# Patient Record
Sex: Female | Born: 1960 | Race: Black or African American | Hispanic: No | Marital: Married | Smoking: Former smoker
Health system: Southern US, Community
[De-identification: ages and names within clinical notes are randomized; demographics above are authoritative.]

## PROBLEM LIST (undated history)

## (undated) DIAGNOSIS — N200 Calculus of kidney: Secondary | ICD-10-CM

## (undated) DIAGNOSIS — Z5189 Encounter for other specified aftercare: Secondary | ICD-10-CM

## (undated) DIAGNOSIS — S88911A Complete traumatic amputation of right lower leg, level unspecified, initial encounter: Secondary | ICD-10-CM

## (undated) DIAGNOSIS — I10 Essential (primary) hypertension: Secondary | ICD-10-CM

## (undated) DIAGNOSIS — G839 Paralytic syndrome, unspecified: Secondary | ICD-10-CM

## (undated) DIAGNOSIS — IMO0001 Reserved for inherently not codable concepts without codable children: Secondary | ICD-10-CM

## (undated) DIAGNOSIS — S88912A Complete traumatic amputation of left lower leg, level unspecified, initial encounter: Secondary | ICD-10-CM

## (undated) HISTORY — PX: OTHER SURGICAL HISTORY: SHX169

## (undated) HISTORY — PX: CHOLECYSTECTOMY: SHX55

## (undated) HISTORY — PX: LEG AMPUTATION: SHX1105

---

## 2000-01-19 ENCOUNTER — Emergency Department (HOSPITAL_COMMUNITY): Admission: EM | Admit: 2000-01-19 | Discharge: 2000-01-19 | Payer: Self-pay | Admitting: Emergency Medicine

## 2000-07-20 ENCOUNTER — Inpatient Hospital Stay (HOSPITAL_COMMUNITY): Admission: AD | Admit: 2000-07-20 | Discharge: 2000-07-26 | Payer: Self-pay | Admitting: Family Medicine

## 2000-07-20 ENCOUNTER — Encounter (INDEPENDENT_AMBULATORY_CARE_PROVIDER_SITE_OTHER): Payer: Self-pay | Admitting: *Deleted

## 2000-07-21 ENCOUNTER — Encounter: Payer: Self-pay | Admitting: Family Medicine

## 2000-08-13 ENCOUNTER — Encounter: Admission: RE | Admit: 2000-08-13 | Discharge: 2000-08-13 | Payer: Self-pay | Admitting: Family Medicine

## 2001-03-22 ENCOUNTER — Emergency Department (HOSPITAL_COMMUNITY): Admission: EM | Admit: 2001-03-22 | Discharge: 2001-03-22 | Payer: Self-pay | Admitting: Emergency Medicine

## 2001-03-24 ENCOUNTER — Inpatient Hospital Stay (HOSPITAL_COMMUNITY): Admission: EM | Admit: 2001-03-24 | Discharge: 2001-05-10 | Payer: Self-pay | Admitting: *Deleted

## 2001-03-24 ENCOUNTER — Encounter (INDEPENDENT_AMBULATORY_CARE_PROVIDER_SITE_OTHER): Payer: Self-pay | Admitting: Specialist

## 2001-03-24 ENCOUNTER — Encounter (INDEPENDENT_AMBULATORY_CARE_PROVIDER_SITE_OTHER): Payer: Self-pay | Admitting: *Deleted

## 2001-03-25 ENCOUNTER — Encounter: Payer: Self-pay | Admitting: Family Medicine

## 2001-03-31 ENCOUNTER — Encounter: Payer: Self-pay | Admitting: Family Medicine

## 2001-04-02 ENCOUNTER — Encounter: Payer: Self-pay | Admitting: Family Medicine

## 2001-04-22 ENCOUNTER — Encounter: Payer: Self-pay | Admitting: Family Medicine

## 2001-05-10 ENCOUNTER — Ambulatory Visit (HOSPITAL_COMMUNITY)
Admission: RE | Admit: 2001-05-10 | Discharge: 2001-05-10 | Payer: Self-pay | Admitting: Physical Medicine & Rehabilitation

## 2001-05-10 ENCOUNTER — Inpatient Hospital Stay
Admission: RE | Admit: 2001-05-10 | Discharge: 2001-06-08 | Payer: Self-pay | Admitting: Physical Medicine & Rehabilitation

## 2002-02-11 ENCOUNTER — Ambulatory Visit (HOSPITAL_COMMUNITY): Admission: RE | Admit: 2002-02-11 | Discharge: 2002-02-11 | Payer: Self-pay | Admitting: Plastic Surgery

## 2002-04-18 ENCOUNTER — Ambulatory Visit (HOSPITAL_COMMUNITY): Admission: RE | Admit: 2002-04-18 | Discharge: 2002-04-18 | Payer: Self-pay | Admitting: Plastic Surgery

## 2002-04-22 ENCOUNTER — Encounter: Payer: Self-pay | Admitting: Orthopedic Surgery

## 2002-04-22 ENCOUNTER — Encounter (INDEPENDENT_AMBULATORY_CARE_PROVIDER_SITE_OTHER): Payer: Self-pay | Admitting: Specialist

## 2002-04-22 ENCOUNTER — Inpatient Hospital Stay (HOSPITAL_COMMUNITY): Admission: RE | Admit: 2002-04-22 | Discharge: 2002-04-26 | Payer: Self-pay | Admitting: Orthopedic Surgery

## 2002-04-26 ENCOUNTER — Encounter: Admission: RE | Admit: 2002-04-26 | Discharge: 2002-04-26 | Payer: Self-pay | Admitting: Family Medicine

## 2003-02-15 ENCOUNTER — Inpatient Hospital Stay (HOSPITAL_COMMUNITY): Admission: EM | Admit: 2003-02-15 | Discharge: 2003-02-21 | Payer: Self-pay | Admitting: Emergency Medicine

## 2003-02-15 ENCOUNTER — Encounter: Payer: Self-pay | Admitting: Emergency Medicine

## 2003-02-18 ENCOUNTER — Encounter: Payer: Self-pay | Admitting: Family Medicine

## 2003-07-06 ENCOUNTER — Emergency Department (HOSPITAL_COMMUNITY): Admission: EM | Admit: 2003-07-06 | Discharge: 2003-07-07 | Payer: Self-pay | Admitting: Emergency Medicine

## 2003-08-09 ENCOUNTER — Inpatient Hospital Stay (HOSPITAL_COMMUNITY): Admission: EM | Admit: 2003-08-09 | Discharge: 2003-09-01 | Payer: Self-pay | Admitting: Emergency Medicine

## 2003-08-12 ENCOUNTER — Encounter (INDEPENDENT_AMBULATORY_CARE_PROVIDER_SITE_OTHER): Payer: Self-pay | Admitting: Specialist

## 2003-08-16 ENCOUNTER — Encounter (INDEPENDENT_AMBULATORY_CARE_PROVIDER_SITE_OTHER): Payer: Self-pay | Admitting: Specialist

## 2003-08-29 ENCOUNTER — Encounter (INDEPENDENT_AMBULATORY_CARE_PROVIDER_SITE_OTHER): Payer: Self-pay | Admitting: Specialist

## 2004-03-21 ENCOUNTER — Ambulatory Visit (HOSPITAL_COMMUNITY): Admission: RE | Admit: 2004-03-21 | Discharge: 2004-03-21 | Payer: Self-pay | Admitting: Urology

## 2005-11-06 ENCOUNTER — Ambulatory Visit: Payer: Self-pay | Admitting: Internal Medicine

## 2005-11-06 ENCOUNTER — Inpatient Hospital Stay (HOSPITAL_COMMUNITY): Admission: EM | Admit: 2005-11-06 | Discharge: 2005-11-19 | Payer: Self-pay | Admitting: Emergency Medicine

## 2005-11-13 ENCOUNTER — Encounter: Payer: Self-pay | Admitting: Urology

## 2005-11-17 ENCOUNTER — Ambulatory Visit: Payer: Self-pay | Admitting: Physical Medicine & Rehabilitation

## 2005-11-19 ENCOUNTER — Inpatient Hospital Stay (HOSPITAL_COMMUNITY)
Admission: RE | Admit: 2005-11-19 | Discharge: 2005-11-26 | Payer: Self-pay | Admitting: Physical Medicine & Rehabilitation

## 2005-11-26 ENCOUNTER — Inpatient Hospital Stay (HOSPITAL_COMMUNITY): Admission: AD | Admit: 2005-11-26 | Discharge: 2005-12-03 | Payer: Self-pay | Admitting: Internal Medicine

## 2005-11-26 ENCOUNTER — Ambulatory Visit: Payer: Self-pay | Admitting: Internal Medicine

## 2005-12-04 ENCOUNTER — Ambulatory Visit (HOSPITAL_COMMUNITY): Admission: RE | Admit: 2005-12-04 | Discharge: 2005-12-04 | Payer: Self-pay | Admitting: Urology

## 2006-01-05 ENCOUNTER — Ambulatory Visit (HOSPITAL_COMMUNITY): Admission: RE | Admit: 2006-01-05 | Discharge: 2006-01-05 | Payer: Self-pay | Admitting: Urology

## 2006-08-20 DIAGNOSIS — E669 Obesity, unspecified: Secondary | ICD-10-CM

## 2006-08-20 DIAGNOSIS — L8990 Pressure ulcer of unspecified site, unspecified stage: Secondary | ICD-10-CM

## 2009-11-27 ENCOUNTER — Inpatient Hospital Stay (HOSPITAL_COMMUNITY): Admission: EM | Admit: 2009-11-27 | Discharge: 2009-12-13 | Payer: Self-pay | Admitting: Emergency Medicine

## 2009-12-06 ENCOUNTER — Ambulatory Visit: Payer: Self-pay | Admitting: Internal Medicine

## 2009-12-12 ENCOUNTER — Encounter: Payer: Self-pay | Admitting: Urology

## 2010-07-14 ENCOUNTER — Encounter: Payer: Self-pay | Admitting: Urology

## 2010-09-08 LAB — BASIC METABOLIC PANEL
CO2: 30 mEq/L (ref 19–32)
Chloride: 104 mEq/L (ref 96–112)
Chloride: 107 mEq/L (ref 96–112)
Creatinine, Ser: 0.45 mg/dL (ref 0.4–1.2)
GFR calc Af Amer: >60 mL/min (ref >60)
GFR calc Af Amer: >60 mL/min (ref >60)
GFR calc non Af Amer: >60 mL/min (ref >60)
GFR calc non Af Amer: >60 mL/min (ref >60)
GFR calc non Af Amer: >60 mL/min (ref >60)
Glucose, Bld: 100 mg/dL — ABNORMAL HIGH (ref 70–99)
Glucose, Bld: 84 mg/dL (ref 70–99)
Potassium: 2.8 mEq/L — ABNORMAL LOW (ref 3.5–5.1)
Sodium: 139 mEq/L (ref 135–145)

## 2010-09-08 LAB — CBC
HCT: 25.3 % — ABNORMAL LOW (ref 36.0–46.0)
HCT: 28.9 % — ABNORMAL LOW (ref 36.0–46.0)
Hemoglobin: 8.1 g/dL — ABNORMAL LOW (ref 12.0–15.0)
Hemoglobin: 9.3 g/dL — ABNORMAL LOW (ref 12.0–15.0)
MCHC: 32 g/dL (ref 30.0–36.0)
MCV: 80.9 fL (ref 78.0–100.0)
Platelets: 450 10*3/uL — ABNORMAL HIGH (ref 150–400)
RBC: 3.13 MIL/uL — ABNORMAL LOW (ref 3.87–5.11)
RBC: 3.57 MIL/uL — ABNORMAL LOW (ref 3.87–5.11)
RDW: 17.2 % — ABNORMAL HIGH (ref 11.5–15.5)
RDW: 18.8 % — ABNORMAL HIGH (ref 11.5–15.5)
WBC: 6.1 10*3/uL (ref 4.0–10.5)
WBC: 6.7 10*3/uL (ref 4.0–10.5)

## 2010-09-08 LAB — COMPREHENSIVE METABOLIC PANEL
ALT: 96 U/L — ABNORMAL HIGH (ref 0–35)
AST: 110 U/L — ABNORMAL HIGH (ref 0–37)
Albumin: 1.2 g/dL — ABNORMAL LOW (ref 3.5–5.2)
Calcium: 5.9 mg/dL — CL (ref 8.4–10.5)
Creatinine, Ser: 0.31 mg/dL — ABNORMAL LOW (ref 0.4–1.2)
GFR calc non Af Amer: >60 mL/min (ref >60)

## 2010-09-08 LAB — PROTIME-INR
INR: 1.35 (ref 0.00–1.49)
Prothrombin Time: 16.6 seconds — ABNORMAL HIGH (ref 11.6–15.2)

## 2010-09-08 LAB — HEPATIC FUNCTION PANEL
ALT: 145 U/L — ABNORMAL HIGH (ref 0–35)
AST: 189 U/L — ABNORMAL HIGH (ref 0–37)
Albumin: 1.7 g/dL — ABNORMAL LOW (ref 3.5–5.2)
Alkaline Phosphatase: 80 U/L (ref 39–117)
Bilirubin, Direct: 0.1 mg/dL (ref 0.0–0.3)
Bilirubin, Direct: <0.1 mg/dL (ref 0.0–0.3)
Indirect Bilirubin: 0.8 mg/dL (ref 0.3–0.9)
Total Protein: 5.8 g/dL — ABNORMAL LOW (ref 6.0–8.3)

## 2010-09-08 LAB — LIPASE, BLOOD: Lipase: 68 U/L — ABNORMAL HIGH (ref 11–59)

## 2010-09-08 LAB — AMYLASE: Amylase: 55 U/L (ref 0–105)

## 2010-09-09 LAB — CBC
HCT: 30.3 % — ABNORMAL LOW (ref 36.0–46.0)
MCHC: 32 g/dL (ref 30.0–36.0)
MCV: 81.7 fL (ref 78.0–100.0)
Platelets: 333 10*3/uL (ref 150–400)
Platelets: 357 10*3/uL (ref 150–400)
Platelets: 367 10*3/uL (ref 150–400)
RDW: 17.5 % — ABNORMAL HIGH (ref 11.5–15.5)
RDW: 17.7 % — ABNORMAL HIGH (ref 11.5–15.5)
RDW: 17.9 % — ABNORMAL HIGH (ref 11.5–15.5)
WBC: 5.7 10*3/uL (ref 4.0–10.5)

## 2010-09-09 LAB — BASIC METABOLIC PANEL
BUN: 10 mg/dL (ref 6–23)
BUN: 6 mg/dL (ref 6–23)
BUN: 7 mg/dL (ref 6–23)
CO2: 23 mEq/L (ref 19–32)
Calcium: 8 mg/dL — ABNORMAL LOW (ref 8.4–10.5)
Calcium: 8.9 mg/dL (ref 8.4–10.5)
Chloride: 110 mEq/L (ref 96–112)
Creatinine, Ser: 0.53 mg/dL (ref 0.4–1.2)
Creatinine, Ser: 0.8 mg/dL (ref 0.4–1.2)
GFR calc Af Amer: >60 mL/min (ref >60)
GFR calc non Af Amer: >60 mL/min (ref >60)
GFR calc non Af Amer: >60 mL/min (ref >60)
Glucose, Bld: 81 mg/dL (ref 70–99)
Potassium: 3.4 mEq/L — ABNORMAL LOW (ref 3.5–5.1)

## 2010-09-09 LAB — URINE CULTURE
Colony Count: 55000
Special Requests: NEGATIVE

## 2010-09-09 LAB — URINALYSIS, ROUTINE W REFLEX MICROSCOPIC
Ketones, ur: 15 mg/dL — AB
Nitrite: NEGATIVE
Specific Gravity, Urine: 1.014 (ref 1.005–1.030)
pH: 6 (ref 5.0–8.0)

## 2010-09-09 LAB — DIFFERENTIAL
Basophils Absolute: 0.1 10*3/uL (ref 0.0–0.1)
Eosinophils Relative: 3 % (ref 0–5)
Lymphocytes Relative: 18 % (ref 12–46)
Neutro Abs: 5.1 10*3/uL (ref 1.7–7.7)
Neutrophils Relative %: 71 % (ref 43–77)

## 2010-09-09 LAB — URINE MICROSCOPIC-ADD ON
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
## 2010-11-08 NOTE — Op Note (Signed)
[name], [name]                  ACCOUNT NO.:  [id number]   MEDICAL RECORD NO.:  [id number]          PATIENT TYPE:  AMB   LOCATION:  DAY                          FACILITY:  [facility]   PHYSICIAN:  Courtney Paris, M.D.DATE OF BIRTH:  Feb 07, 1961   DATE OF PROCEDURE:  11/12/2005  DATE OF DISCHARGE:                                 OPERATIVE REPORT   PREOPERATIVE DIAGNOSIS:  Proximal left ureteral stone with hydronephrosis  and sepsis, bladder stones.   POSTOPERATIVE DIAGNOSIS:  Proximal left ureteral stone with hydronephrosis  and sepsis, bladder stones.   OPERATION:  Vesicolithotomy, left ureteroscopy with Holmium lithotripsy, and  attempted insertion of left ureteral stent.   ANESTHESIA:  MAC.   SURGEON:  Courtney Paris, M.D.   ASSISTANT:  None.   BRIEF HISTORY:  This 50 year old paraplegic bilateral amputee was admitted  to the hospital last week with GU sepsis and was found to have a  hydronephrosis and had first a PICC line inserted and then a left  percutaneous nephrostomy.  She has been on antibiotics since then and enters  now for attempt at ureteroscopy of a large stone 2 by 1 cm in the proximal  left ureter.  She has a regrowth of her right staghorn which was treated by  percutaneous nephrolithotomy two years ago at [facility].  I saw her  two years ago also and she had no stones on the left except one that was in  the left distal ureter that passed after a was placed at that time and she  had some large bladder stones that were lasered at that time, as well.  The  stones have regrown on both sides and the obstructing stone on the left as  the main culprit to be addressed at this time.   DESCRIPTION OF PROCEDURE:  The patient was placed on the operating table in  a supine position. After satisfactory induction of MAC anesthesia because of  her paraplegia, the bladder was entered with a cystoscope.  There were some  large stones within the bladder which  were picked out using grasping  forceps.  The largest was about 3 cm in size.  The smaller ones were also  picked out until the bladder was clean.  The left orifice was then  catheterized with a ureteral guidewire and under fluoroscopy, I passed this  up to the level of the stone.  I could not get this to go beyond the stone  and it seemed to be impacted at this point.  Leaving the guidewire in place,  I was able to pass the short ureteroscope up the ureter which was fairly  well dilated up to the level of the stone.  The Holmium laser with the 375  micron fiber was then calibrated and at 0.5 watts at 5 shocks per second,  the stone was began to be treated under direct vision. After about 45  minutes, I was able to remove about half of the stone by fluoroscopy.  There  was still a large piece seen but I could not, even under direct  vision, get  a guidewire past this and fear of injuring the ureter, I decided to abandon  the procedure at this point.  A lot of the pieces that I broke up should  pass out through the ureter.  No large pieces were seen as the scope was  removed.  The bladder was then drained and a #18 Foley catheter inserted,  and the patient taken to the recovery room in good condition.   PLAN:  We need to do lithotripsy on the remaining fragment which is now  considerably smaller and maybe even some of the stones in the left kidney  with a nephrostomy in place or attempt at a percutaneous procedure which  would be a little bit more invasive at a later time.  It depends whether  this can be scheduled while she is in the hospital and while she still has a  PICC line.      Courtney Paris, M.D.  Electronically Signed     HMK/MEDQ  D:  11/12/2005  T:  11/12/2005  Job:  [id number]
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
## 2010-11-08 NOTE — Discharge Summary (Signed)
[name], [name]                  ACCOUNT NO.:  [id number]   MEDICAL RECORD NO.:  [id number]          PATIENT TYPE:  INP   LOCATION:  2032                         FACILITY:  [facility]   PHYSICIAN:  C. Ulyess Mort, M.D.DATE OF BIRTH:  1961/05/19   DATE OF ADMISSION:  11/26/2005  DATE OF DISCHARGE:                                 DISCHARGE SUMMARY   ADDENDUM:   MEDICATION LIST:  Aygestin 5 mg p.o. nightly      Clent Demark, M.D.    ______________________________  C. Ulyess Mort, M.D.    Verlin Grills  D:  12/03/2005  T:  12/03/2005  Job:  [id number]
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
## 2010-11-08 NOTE — Discharge Summary (Signed)
NAMEMASAYO, FERA                  ACCOUNT NO.:  [id number]   MEDICAL RECORD NO.:  [id number]          PATIENT TYPE:  INP   LOCATION:  2032                         FACILITY:  [facility]   PHYSICIAN:  C. Ulyess Mort, M.D.DATE OF BIRTH:  1961-04-27   DATE OF ADMISSION:  11/26/2005  DATE OF DISCHARGE:  12/03/2005                                 DISCHARGE SUMMARY   ADDENDUM:   DISCHARGE MEDICATIONS:  1.  Coumadin 5 mg p.o. daily.  2.  Trazodone 25 mg p.o. q.h.s.  3.  Protonix 40 mg p.o. daily.  4.  Ibuprofen 600 mg q.6 h p.r.n. pain.  5.  Morphine 1-2 mg IV p.r.n. severe pain q.4 h.   Please see the discharge summary dated December 01, 2005 for complete discharge  information. On the day of anticipated discharge, Ms. Pierron had some vaginal  bleeding as well as hematuria noted in her catheter bag. For this reason,  she was held an additional day to make sure that blood counts were stable.  For the menstrual bleeding, she was restarted on Aygestin which was  originally held when she was diagnosed with pulmonary embolus. It was felt  by the gynecologist, the pharmacist, and in discussion with the hematologist  that this would be a safe medication to continue in the setting of  anticoagulation for acute pulmonary embolism. The patient will need close  followup with gynecology for more definitive management for menorrhagia. She  has been instructed to followup with Dr. Jackelyn Knife who is scheduled to  return from vacation on June 18. By the day of discharge, the patient's  Lovenox has been discontinued as she has had a therapeutic INR for the past  two days and has had five full days of antithrombin overlap between Lovenox  and Coumadin. On today's labs, the patient's INR was 3.1, however given the  patient's age, weight and previous doses of Coumadin, it is felt that she  will likely need 5 mg daily dose to maintain a therapeutic INR. She will  need and INR tracked on Friday, December 05, 2005 and  again the following  Tuesday, December 10, 2005 to ensure that her INR is maintained between 2 and 3  which is the goal for pulmonary embolism. The Coumadin dose can be adjusted  accordingly to maintain therapeutic INR. Additionally, the patient was noted  to have some hematuria on December 02, 2005 which did decrease over the course  of the day. CBC showed a hemoglobin of 12.1 which is consistent with  previous value. Ms. Bragdon has an appointment to followup with Dr. Aldean Ast in  urology December 04, 2005, the day following discharge at 1:15 in the afternoon  for further management of the hematuria and positive urine cultures from  November 30, 2005 ordered by Dr. Aldean Ast which are growing Proteus Mirabilis  which is resistant to Cipro and Macrobid. We will defer the decision of  whether or not to treat this as an infection versus colonization to Dr.  Aldean Ast in the outpatient setting.   DISCHARGE LABS AND VITALS:  Temperature 98.8, blood pressure  132/80, pulse  76, respirations 20. Oxygen saturation 96% on room air. White count 6.1,  hemoglobin 11.3, platelets 330. INR 3.1.      Clent Demark, M.D.    ______________________________  C. Ulyess Mort, M.D.    Verlin Grills  D:  12/03/2005  T:  12/03/2005  Job:  [id number]   cc:   Courtney Paris, M.D.  Fax: [phone]   Zenaida Niece, M.D.  Fax: [phone]   Lianne Bushy, M.D.  Fax: [phone]
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
## 2010-11-08 NOTE — Op Note (Signed)
. [facility]  Patient:    [name], [name] Visit Number: [id number] MRN: [id number]          Service Type: MED Location: [phone] Attending Physician:  Sanjuana Letters Dictated by:   Nadara Mustard, M.D. Proc. Date: 04/02/01 Admit Date:  03/24/2001                             Operative Report  PREOPERATIVE DIAGNOSES: 1. Osteomyelitis, right hindfoot. 2. [address] grade 3 ulceration involving the entire plantar aspect of the foot.  PROCEDURE:  Right below-knee amputation.  SURGEON:  Nadara Mustard, M.D.  ANESTHESIA:  MAC.  ESTIMATED BLOOD LOSS:  Minimal.  TOURNIQUET TIME:  15 minutes at 350 mmHg.  DRESSING:  Quincy Simmonds compressive dressing.  ANTIBIOTICS:  Unasyn preoperatively.  DISPOSITION:  To PACU in stable condition.  INDICATION FOR PROCEDURE:  The patient is a 50 year old woman paraplegic with large ulcerations on both feet.  She has had an ulceration on her right foot which probes the bone, involves the entire plantar aspect of her foot.  Bone scan is positive for osteomyelitis.  After discussion with the patient, continued conservative care with wound dressing with persistent ulceration. Patient wishes to proceed with below-knee amputation at this time.  The risks and benefits were discussed, including infection, nonhealing of the wound, need for a higher-level amputation.  Patient states she understands and wishes to proceed at this time.  DESCRIPTION OF PROCEDURE:  The patient was brought to OR room 15 and underwent a MAC anesthetic.  After adequate level of anesthesia obtained, patients right lower extremity was prepped using Duraprep, draped into a sterile field, and her foot was draped sterilely and then impervious stockinette.  A transverse incision was made 10 cm distal to the tibial tubercle, and this was curved proximally and a large posterior flap was developed.  The tibia was cut 1 cm proximal to the skin  incision and the fibula 1 cm proximal to the tibia. The neurovascular bundles were clamped.  The amputation was performed and all neurovascular bundles were suture ligated x 3 with 2-0 silk.  The tourniquet was deflated after 15 minutes.  Hemostasis was obtained.  The deep posterior flap was closed using #1 PDS, the deep fascia was closed using 0 Vicryl, skin was closed using Approximate staples.  The wound was covered with Adaptic orthopedic sponges, and a compressive Quincy Simmonds dressing was applied.  The patient was taken to the PACU in stable condition.  Plan to follow up with her medical management and continue wound care for her left foot. Dictated by:   Nadara Mustard, M.D. Attending Physician:  Sanjuana Letters DD:  04/02/01 TD:  04/02/01 Job: [id number]
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
## 2010-11-08 NOTE — Op Note (Signed)
   NAMEMEGIN, CONSALVO                            ACCOUNT NO.:  [id number]   MEDICAL RECORD NO.:  [id number]                   PATIENT TYPE:  OUT   LOCATION:  MDC                                  FACILITY:  [facility]   PHYSICIAN:  Etter Sjogren, MD                    DATE OF BIRTH:  July 06, 1960   DATE OF PROCEDURE:  04/18/2002  DATE OF DISCHARGE:                                 OPERATIVE REPORT   PREOPERATIVE DIAGNOSIS:  Sacral pressure sore.   POSTOPERATIVE DIAGNOSIS:  Sacral pressure sore.   OPERATION:  Debridement of sacral pressure sore.   SURGEON:  Etter Sjogren, MD   CLINICAL NOTE:  [age]-year-old woman with paraplegia has sacral  pressures sores that have been present for some months.  She continues to be  on her back too much and the sacral pressure sore has enlarged, and in one  area has become a little bit deeper.  We have explained the importance of  being off her back and turned side-to-side every two hours.  Today a  debridement will be performed.   DESCRIPTION OF OPERATION:  The debridement was performed sharply of the area  that was just to the right of the midline, not directly over the sacrum.  The wound appeared to go down for a couple of centimeters, but did not  appear to go deep to the sacrum itself.  This would make this a Stage 2 at  this point.  There was no purulence encountered and no abscess.   DISPOSITION:  Saline dressings twice a day were to be arranged with the  visiting nurse.  I have also spoke with Dr. Aldean Baker today, her  orthopedic surgeon, who will be performing a foot amputation later in the  week and I explained that I think an air-fluidized bed would be important  while she is hospitalized in order to try and avoid a worsening of her  sacral pressure sore in the postoperative convalescence phase.  We will see  her back in two months.                                                 Etter Sjogren, MD    DB/MEDQ  D:  04/18/2002  T:   04/18/2002  Job:  [id number]
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
## 2010-11-08 NOTE — Consult Note (Signed)
[place]. [facility]  Patient:    [name], [name] Visit Number: [id number] MRN: [id number]          Service Type: MED Location: [phone] Attending Physician:  Sanjuana Letters Proc. Date: 03/24/01 Admit Date:  03/24/2001                            Consultation Report  HISTORY OF PRESENT ILLNESS:  The patient is a 50 year old woman who was a T5 paraplegic who has been followed by myself through [facility] and my office.  She is status post a right fifth ray amputation secondary to ischemia necrosis from weightbearing secondary to her paralysis.  The patient was supposed to follow up in my office this Monday in consultation.  She stated she had a temperature of 103 and was unable to come to the office due to her fever and chills.  We scheduled an EMS evaluation in the emergency room.  The patient was seen in the emergency room by ER staff, was discharged to home on Cipro for a urinary tract infection.  The patient states she did not get her prescription filled.  When I called today to follow up with the patient she states she still was sick, still had a temperature of 103, had nausea and vomiting, fever, chills, with temperature running between 103 to 104. Again we set up an EMS transportation to the emergency room and also setup for evaluation by the teaching service as well as plastic surgery for what has been described by her physical therapist as a quickly increasing decubitus sacral ulcer and her reported fever.  SOCIAL HISTORY:  She is wheelchair bound.  She lives with her husband and 58-year-old daughter.  PAST SURGICAL HISTORY:  Right fifth ray amputation secondary to right grade III ulceration with osteomyelitis and T5 paraplegia.  PHYSICAL EXAMINATION:  LEFT FOOT:  She has a lateral plantar ulcer under the fourth and fifth metatarsal heads with a small eschar and good granulation tissue around the edges.  There is no  cellulitis, no abscess, no drainage.  She also has a fairly thin eschar over the left heel and there is no cellulitis or evidence of any purulence over this ulcer.  RIGHT FOOT:  She has a large plantar ulcer which has been debrided in the office.  She has great granulation tissue and this ulcer appears to be healing quite nicely with good granulation tissue and has decreased in size.  There is no abscess, no cellulitis, and no drainage.  She does have a new lateral malleolar ulcer over the right ankle which has good granulation tissue at the base and this is new since the last time I saw her.  BACK, BUTTOCKS, PERINEUM:  Two large sacral ulcers with necrotic tissue. Her lower back and buttocks area extremely warm to touch, cellulitic, and she does have what appears to be a large labral abscess with cellulitis.  ASSESSMENT: 1. Bilateral plantar ulcers which are stable. 2. Necrotic sacral ulcers x 2. 3. Labral abscess.  PLAN:  We will have the teaching service evaluate her for her medical management, will get her in a [place] bed, plastic surgery to evaluate the abscess and decubitus ulcer.  Wound care for her feet.  Social worker for skilled nursing placement.  Radiographs of the feet and pelvis, blood cultures. Attending Physician:  Sanjuana Letters DD:  03/24/01 TD:  03/25/01 Job: [id number]

## 2010-11-08 NOTE — Op Note (Signed)
NAMECHARMA, MOCARSKI                            ACCOUNT NO.:  [id number]   MEDICAL RECORD NO.:  [id number]                   PATIENT TYPE:  OUT   LOCATION:  MDC                                  FACILITY:  [facility]   PHYSICIAN:  Nadara Mustard, M.D.                DATE OF BIRTH:  Apr 26, 1961   DATE OF PROCEDURE:  04/22/2002  DATE OF DISCHARGE:  04/18/2002                                 OPERATIVE REPORT   PREOPERATIVE DIAGNOSES:  Ischemic necrotic left hind foot with Wagner grade  3 ulcer and osteomyelitis.   POSTOPERATIVE DIAGNOSES:  Ischemic necrotic left hind foot with Wagner grade  3 ulcer and osteomyelitis.   OPERATION PERFORMED:  Left below-knee amputation.   SURGEON:  Nadara Mustard, M.D.   ANESTHESIA:  General.   ESTIMATED BLOOD LOSS:  Minimal.   ANTIBIOTICS:  1 gm Kefzol.   TOURNIQUET TIME:  11 minutes at 350 mmHg.   DISPOSITION:  To PACU in stable condition with a Ivette Loyal compressive  dressing.   INDICATIONS FOR PROCEDURE:  The patient is a 50 year old woman with a T5  paraplegia with ischemic necrotic left hind foot with a Wagner grade 3 ulcer  and osteomyelitis who presents at this time for amputation.  The risks and  benefits were discussed.  The patient states that she understands and wishes  to proceed at this time.   DESCRIPTION OF PROCEDURE:  The patient was brought to operating room 1 and  underwent a general anesthetic.  After adequate levels of anesthesia were  obtained, the patient's left lower extremity was prepped using DuraPrep and  draped into a sterile field.  The leg was elevated and the tourniquet  inflated to 350 mmHg.  A transverse incision was made 10 cm distal to the  tibial tubercle.  This was curved proximally and a large posterior flap was  developed.  The tibia was transected 1 cm proximal and the skin of the  fibula transected 1 cm proximal to the tibia.  A large neck was then used to  fashion a large posterior flap.  The sciatic  nerve was pulled, cut and  allowed to retract.  The vascular structures were suture ligated times three  with 2-0 silk.  The tourniquet was deflated.  Hemostasis was obtained.  The  deep and superficial fascias were closed using #1 PDS.  The skin was closed  using Proximate staples.  The wound was covered with Adaptic orthopedic  sponges and a compressive Ivette Loyal dressing was applied.  The patient  was extubated and taken to PACU in stable condition.                                                 Nadara Mustard,  M.D.    MVD/MEDQ  D:  04/22/2002  T:  04/22/2002  Job:  [id number]
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
## 2011-09-22 DIAGNOSIS — I10 Essential (primary) hypertension: Secondary | ICD-10-CM

## 2011-09-22 HISTORY — DX: Essential (primary) hypertension: I10

## 2011-10-13 ENCOUNTER — Encounter (HOSPITAL_COMMUNITY): Payer: Self-pay

## 2011-10-13 ENCOUNTER — Inpatient Hospital Stay (HOSPITAL_COMMUNITY)
Admission: EM | Admit: 2011-10-13 | Discharge: 2011-10-17 | DRG: 694 | Disposition: A | Discharge status: Home / Self Care | Payer: Managed Care, Other (non HMO) | Attending: Urology | Admitting: Urology

## 2011-10-13 ENCOUNTER — Emergency Department (HOSPITAL_COMMUNITY): Payer: Managed Care, Other (non HMO)

## 2011-10-13 ENCOUNTER — Emergency Department (HOSPITAL_COMMUNITY): Payer: Managed Care, Other (non HMO) | Admitting: Anesthesiology

## 2011-10-13 ENCOUNTER — Encounter (HOSPITAL_COMMUNITY): Payer: Self-pay | Admitting: Anesthesiology

## 2011-10-13 ENCOUNTER — Encounter (HOSPITAL_COMMUNITY)
Admission: EM | Disposition: A | Discharge status: Home / Self Care | Payer: Self-pay | Source: Home / Self Care | Attending: Urology

## 2011-10-13 DIAGNOSIS — S88119A Complete traumatic amputation at level between knee and ankle, unspecified lower leg, initial encounter: Secondary | ICD-10-CM

## 2011-10-13 DIAGNOSIS — G8220 Paraplegia, unspecified: Secondary | ICD-10-CM | POA: Diagnosis present

## 2011-10-13 DIAGNOSIS — K439 Ventral hernia without obstruction or gangrene: Secondary | ICD-10-CM | POA: Diagnosis present

## 2011-10-13 DIAGNOSIS — N201 Calculus of ureter: Principal | ICD-10-CM | POA: Diagnosis present

## 2011-10-13 DIAGNOSIS — N200 Calculus of kidney: Secondary | ICD-10-CM

## 2011-10-13 DIAGNOSIS — N1330 Unspecified hydronephrosis: Secondary | ICD-10-CM | POA: Diagnosis present

## 2011-10-13 DIAGNOSIS — R319 Hematuria, unspecified: Secondary | ICD-10-CM | POA: Diagnosis present

## 2011-10-13 DIAGNOSIS — Z6841 Body Mass Index (BMI) 40.0 and over, adult: Secondary | ICD-10-CM

## 2011-10-13 DIAGNOSIS — N390 Urinary tract infection, site not specified: Secondary | ICD-10-CM

## 2011-10-13 DIAGNOSIS — I10 Essential (primary) hypertension: Secondary | ICD-10-CM

## 2011-10-13 DIAGNOSIS — N319 Neuromuscular dysfunction of bladder, unspecified: Secondary | ICD-10-CM | POA: Diagnosis present

## 2011-10-13 DIAGNOSIS — Z87440 Personal history of urinary (tract) infections: Secondary | ICD-10-CM

## 2011-10-13 HISTORY — DX: Paralytic syndrome, unspecified: G83.9

## 2011-10-13 HISTORY — DX: Complete traumatic amputation of right lower leg, level unspecified, initial encounter: S88.911A

## 2011-10-13 HISTORY — DX: Calculus of kidney: N20.0

## 2011-10-13 HISTORY — DX: Complete traumatic amputation of right lower leg, level unspecified, initial encounter: S88.912A

## 2011-10-13 HISTORY — PX: CYSTOSCOPY W/ URETERAL STENT PLACEMENT: SHX1429

## 2011-10-13 LAB — POCT I-STAT, CHEM 8
BUN: 29 mg/dL — ABNORMAL HIGH (ref 6–23)
Creatinine, Ser: 1.6 mg/dL — ABNORMAL HIGH (ref 0.50–1.10)
Potassium: 4.3 mEq/L (ref 3.5–5.1)
Sodium: 138 mEq/L (ref 135–145)
TCO2: 21 mmol/L (ref 0–100)

## 2011-10-13 LAB — DIFFERENTIAL
Basophils Relative: 0 % (ref 0–1)
Eosinophils Absolute: 0.2 10*3/uL (ref 0.0–0.7)
Monocytes Relative: 11 % (ref 3–12)
Neutro Abs: 7.4 10*3/uL (ref 1.7–7.7)
Neutrophils Relative %: 76 % (ref 43–77)

## 2011-10-13 LAB — URINALYSIS, ROUTINE W REFLEX MICROSCOPIC
Ketones, ur: NEGATIVE mg/dL
Nitrite: POSITIVE — AB
Specific Gravity, Urine: 1.013 (ref 1.005–1.030)
Urobilinogen, UA: 2 mg/dL — ABNORMAL HIGH (ref 0.0–1.0)
pH: 8.5 — ABNORMAL HIGH (ref 5.0–8.0)

## 2011-10-13 LAB — POCT PREGNANCY, URINE: Preg Test, Ur: NEGATIVE

## 2011-10-13 LAB — CBC
Hemoglobin: 12 g/dL (ref 12.0–15.0)
MCHC: 31.5 g/dL (ref 30.0–36.0)
Platelets: 274 10*3/uL (ref 150–400)
RBC: 4.62 MIL/uL (ref 3.87–5.11)

## 2011-10-13 LAB — URINE MICROSCOPIC-ADD ON

## 2011-10-13 SURGERY — CYSTOSCOPY, WITH RETROGRADE PYELOGRAM AND URETERAL STENT INSERTION
Anesthesia: General | Site: Ureter | Laterality: Right | Wound class: Clean Contaminated

## 2011-10-13 MED ORDER — PROPOFOL 10 MG/ML IV EMUL
INTRAVENOUS | Status: DC | PRN
  Administered 2011-10-13: 200 mg via INTRAVENOUS

## 2011-10-13 MED ORDER — MEPERIDINE HCL 50 MG/ML IJ SOLN: 6.2500 mg | INTRAMUSCULAR | Status: DC | PRN

## 2011-10-13 MED ORDER — ACETAMINOPHEN 10 MG/ML IV SOLN
1000.0000 mg | Freq: Four times a day (QID) | INTRAVENOUS | Status: AC
  Administered 2011-10-13 – 2011-10-14 (×4): 1000 mg via INTRAVENOUS
  Filled 2011-10-13 (×4): qty 100

## 2011-10-13 MED ORDER — SODIUM CHLORIDE 0.9 % IR SOLN
Status: DC | PRN
  Administered 2011-10-13: 3000 mL

## 2011-10-13 MED ORDER — MORPHINE SULFATE 4 MG/ML IJ SOLN
4.0000 mg | Freq: Once | INTRAMUSCULAR | Status: AC
  Administered 2011-10-13: 4 mg via INTRAVENOUS
  Filled 2011-10-13: qty 1

## 2011-10-13 MED ORDER — FENTANYL CITRATE 0.05 MG/ML IJ SOLN
INTRAMUSCULAR | Status: DC | PRN
  Administered 2011-10-13 (×2): 50 ug via INTRAVENOUS

## 2011-10-13 MED ORDER — IOHEXOL 300 MG/ML  SOLN
INTRAMUSCULAR | Status: DC | PRN
  Administered 2011-10-13: 10 mL

## 2011-10-13 MED ORDER — SODIUM CHLORIDE 0.9 % IV SOLN
1.5000 g | Freq: Four times a day (QID) | INTRAVENOUS | Status: AC
  Administered 2011-10-13 – 2011-10-14 (×3): 1.5 g via INTRAVENOUS
  Filled 2011-10-13 (×3): qty 1.5

## 2011-10-13 MED ORDER — LIDOCAINE HCL 2 % EX GEL
CUTANEOUS | Status: AC
  Filled 2011-10-13: qty 10

## 2011-10-13 MED ORDER — SODIUM CHLORIDE 0.9 % IV SOLN
INTRAVENOUS | Status: DC
  Administered 2011-10-13 – 2011-10-16 (×5): via INTRAVENOUS

## 2011-10-13 MED ORDER — SODIUM CHLORIDE 0.9 % IV SOLN
Freq: Once | INTRAVENOUS | Status: AC
  Administered 2011-10-13: 09:00:00 via INTRAVENOUS

## 2011-10-13 MED ORDER — ONDANSETRON HCL 4 MG/2ML IJ SOLN
4.0000 mg | Freq: Once | INTRAMUSCULAR | Status: AC
  Administered 2011-10-13: 4 mg via INTRAVENOUS
  Filled 2011-10-13: qty 2

## 2011-10-13 MED ORDER — PROMETHAZINE HCL 25 MG/ML IJ SOLN: 6.2500 mg | INTRAMUSCULAR | Status: DC | PRN

## 2011-10-13 MED ORDER — IOHEXOL 300 MG/ML  SOLN
INTRAMUSCULAR | Status: AC
  Filled 2011-10-13: qty 1

## 2011-10-13 MED ORDER — LACTATED RINGERS IV SOLN: INTRAVENOUS | Status: DC

## 2011-10-13 MED ORDER — DEXTROSE 5 % IV SOLN
1.0000 g | Freq: Once | INTRAVENOUS | Status: AC
  Administered 2011-10-13: 1 g via INTRAVENOUS
  Filled 2011-10-13: qty 10

## 2011-10-13 MED ORDER — GENTAMICIN IN SALINE 1.6-0.9 MG/ML-% IV SOLN
INTRAVENOUS | Status: AC
  Filled 2011-10-13: qty 50

## 2011-10-13 MED ORDER — ACETAMINOPHEN 325 MG PO TABS: 650.0000 mg | ORAL_TABLET | ORAL | Status: DC | PRN

## 2011-10-13 MED ORDER — SENNOSIDES-DOCUSATE SODIUM 8.6-50 MG PO TABS
1.0000 | ORAL_TABLET | Freq: Two times a day (BID) | ORAL | Status: DC
  Administered 2011-10-13 – 2011-10-15 (×3): 1 via ORAL
  Filled 2011-10-13 (×10): qty 1

## 2011-10-13 MED ORDER — ONDANSETRON HCL 4 MG/2ML IJ SOLN
4.0000 mg | INTRAMUSCULAR | Status: DC | PRN
  Administered 2011-10-14 – 2011-10-15 (×4): 4 mg via INTRAVENOUS
  Filled 2011-10-13 (×4): qty 2

## 2011-10-13 MED ORDER — LIDOCAINE HCL (CARDIAC) 20 MG/ML IV SOLN
INTRAVENOUS | Status: DC | PRN
  Administered 2011-10-13: 100 mg via INTRAVENOUS

## 2011-10-13 MED ORDER — MORPHINE SULFATE 2 MG/ML IJ SOLN
2.0000 mg | INTRAMUSCULAR | Status: DC | PRN
  Administered 2011-10-14: 2 mg via INTRAVENOUS
  Filled 2011-10-13: qty 1

## 2011-10-13 MED ORDER — OXYCODONE HCL 5 MG PO TABS
5.0000 mg | ORAL_TABLET | ORAL | Status: DC | PRN
  Administered 2011-10-14: 10 mg via ORAL
  Filled 2011-10-13: qty 2

## 2011-10-13 MED ORDER — HEPARIN SODIUM (PORCINE) 5000 UNIT/ML IJ SOLN
5000.0000 [IU] | Freq: Three times a day (TID) | INTRAMUSCULAR | Status: DC
  Administered 2011-10-13 – 2011-10-17 (×10): 5000 [IU] via SUBCUTANEOUS
  Filled 2011-10-13 (×14): qty 1

## 2011-10-13 MED ORDER — GENTAMICIN IN SALINE 0.8-0.9 MG/ML-% IV SOLN
80.0000 mg | Freq: Once | INTRAVENOUS | Status: AC
  Administered 2011-10-13: 80 mg via INTRAVENOUS

## 2011-10-13 MED ORDER — HYDROMORPHONE HCL PF 1 MG/ML IJ SOLN: 0.2500 mg | INTRAMUSCULAR | Status: DC | PRN

## 2011-10-13 SURGICAL SUPPLY — 20 items
ADAPTER CATH URET PLST 4-6FR (CATHETERS) ×2 IMPLANT
ADPR CATH URET STRL DISP 4-6FR (CATHETERS) ×1
BAG URO CATCHER STRL LF (DRAPE) ×2 IMPLANT
CATH INTERMIT  6FR 70CM (CATHETERS) ×1 IMPLANT
CATH SILASTIC FOLEY 18FRX5CC (CATHETERS) ×1 IMPLANT
CATH SILASTIC FOLEY 22FRX30CC (CATHETERS) ×1 IMPLANT
CATH URET 5FR 28IN OPEN ENDED (CATHETERS) ×1 IMPLANT
CLOTH BEACON ORANGE TIMEOUT ST (SAFETY) ×2 IMPLANT
DRAPE CAMERA CLOSED 9X96 (DRAPES) ×2 IMPLANT
GLOVE BIOGEL PI IND STRL 7.5 (GLOVE) ×1 IMPLANT
GLOVE BIOGEL PI INDICATOR 7.5 (GLOVE) ×1
GLOVE ECLIPSE 7.5 STRL STRAW (GLOVE) ×1 IMPLANT
GOWN PREVENTION PLUS XLARGE (GOWN DISPOSABLE) ×2 IMPLANT
GOWN STRL NON-REIN LRG LVL3 (GOWN DISPOSABLE) ×2 IMPLANT
GUIDEWIRE STR DUAL SENSOR (WIRE) ×2 IMPLANT
MANIFOLD NEPTUNE II (INSTRUMENTS) ×2 IMPLANT
PACK CYSTO (CUSTOM PROCEDURE TRAY) ×2 IMPLANT
STENT CONTOUR 6FRX26X.038 (STENTS) ×1 IMPLANT
STENT CONTOUR 6FRX28X.038 (STENTS) ×1 IMPLANT
TUBING CONNECTING 10 (TUBING) ×1 IMPLANT

## 2011-10-13 NOTE — Anesthesia Postprocedure Evaluation (Signed)
  Anesthesia Post-op Note  Patient: [name]  Procedure(s) Performed: Procedure(s) (LRB): CYSTOSCOPY WITH RETROGRADE PYELOGRAM/URETERAL STENT PLACEMENT (Right)  Patient Location: PACU  Anesthesia Type: General  Level of Consciousness: oriented and sedated  Airway and Oxygen Therapy: Patient Spontanous Breathing and Patient connected to nasal cannula oxygen  Post-op Pain: mild  Post-op Assessment: Post-op Vital signs reviewed, Patient's Cardiovascular Status Stable, Respiratory Function Stable and Patent Airway  Post-op Vital Signs: stable  Complications: No apparent anesthesia complications

## 2011-10-13 NOTE — ED Notes (Signed)
IV started R wrist, unable to draw back blood specimen.

## 2011-10-13 NOTE — ED Provider Notes (Addendum)
Medical screening examination/treatment/procedure(s) were conducted as a shared visit with non-physician practitioner(s) and myself.  I personally evaluated the patient during the encounter  Pt with recurrent large urteral stones causing an obstruction.  Pt with indwelling catheter but urine is concerning for possible uti.  At this time, heme stable.  No sign of sepsis.  Discussed findings with patient.  Consulted Dr Margarita Grizzle.  Plan on operative intervention to remove stone.  Pt placed NPO  Date: 10/13/2011  Rate: 88  Rhythm: normal sinus rhythm  QRS Axis: normal  Intervals: normal  ST/T Wave abnormalities: normal  Conduction Disutrbances:none  Narrative Interpretation:   Old EKG Reviewed: unchanged   Celene Kras, MD 10/13/11 1118  Celene Kras, MD 10/13/11 1146

## 2011-10-13 NOTE — Anesthesia Preprocedure Evaluation (Addendum)
Anesthesia Evaluation  Patient identified by MRN, date of birth, ID band Patient awake    Reviewed: Allergy & Precautions, H&P , NPO status , Patient's Chart, lab work & pertinent test results  Airway Mallampati: II TM Distance: >3 FB Neck ROM: Full    Dental No notable dental hx. (+) Poor Dentition   Pulmonary neg pulmonary ROS,  breath sounds clear to auscultation  Pulmonary exam normal       Cardiovascular negative cardio ROS  Rhythm:Regular Rate:Normal     Neuro/Psych T5 paraplegic.  S/p bil BKA's negative neurological ROS  negative psych ROS   GI/Hepatic negative GI ROS, Neg liver ROS,   Endo/Other  negative endocrine ROSMorbid obesity  Renal/GU negative Renal ROS  negative genitourinary   Musculoskeletal negative musculoskeletal ROS (+)   Abdominal   Peds negative pediatric ROS (+)  Hematology negative hematology ROS (+)   Anesthesia Other Findings   Reproductive/Obstetrics negative OB ROS                          Anesthesia Physical Anesthesia Plan  ASA: II  Anesthesia Plan: General   Post-op Pain Management:    Induction: Intravenous  Airway Management Planned: LMA  Additional Equipment:   Intra-op Plan:   Post-operative Plan: Extubation in OR  Informed Consent: I have reviewed the patients History and Physical, chart, labs and discussed the procedure including the risks, benefits and alternatives for the proposed anesthesia with the patient or authorized representative who has indicated his/her understanding and acceptance.   Dental advisory given  Plan Discussed with: CRNA  Anesthesia Plan Comments:         Anesthesia Quick Evaluation

## 2011-10-13 NOTE — ED Notes (Signed)
Pt is a double amputee, no standing vitals.
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
## 2011-10-13 NOTE — ED Notes (Signed)
Patient reports that she began having cloudy urine in her catheter 4 days ago and today noticed blood in her urine. Patient reports that she has had a catheter x 15 years and changed the catheter yesterday. Patient denies fever and chills.

## 2011-10-13 NOTE — Brief Op Note (Signed)
10/13/2011  3:37 PM  PATIENT:  [name]  51 y.o. female  PRE-OPERATIVE DIAGNOSIS:  bilateral ureteral blockage  POST-OPERATIVE DIAGNOSIS:  bilateral ureteral blockage  PROCEDURE:  Procedure(s) (LRB): CYSTOSCOPY  Right RETROGRADE PYELOGRAM Bilateral URETERAL STENT PLACEMENT   SURGEON:  Surgeon(s) and Role:    * Milford Cage, MD - Primary  PHYSICIAN ASSISTANT:   ASSISTANTS: none   ANESTHESIA:   general  EBL:  Total I/O In: -  Out: 350 [Urine:350]  BLOOD ADMINISTERED:none  DRAINS: Urinary Catheter (Foley)   LOCAL MEDICATIONS USED:  NONE  SPECIMEN:  No Specimen  DISPOSITION OF SPECIMEN:  N/A  COUNTS:  YES  TOURNIQUET:  * No tourniquets in log *  DICTATION: .Other Dictation: Dictation Number [id number]  PLAN OF CARE: Admit for overnight observation  PATIENT DISPOSITION:  PACU - hemodynamically stable.   Delay start of Pharmacological VTE agent (>24hrs) due to surgical blood loss or risk of bleeding: no

## 2011-10-13 NOTE — ED Notes (Signed)
Patient transported to CT 

## 2011-10-13 NOTE — Transfer of Care (Signed)
Immediate Anesthesia Transfer of Care Note  Patient: [name]  Procedure(s) Performed: Procedure(s) (LRB): CYSTOSCOPY WITH RETROGRADE PYELOGRAM/URETERAL STENT PLACEMENT (Right)  Patient Location: PACU  Anesthesia Type: General  Level of Consciousness: awake, alert  and oriented  Airway & Oxygen Therapy: Patient Spontanous Breathing and Patient connected to face mask oxygen  Post-op Assessment: Report given to PACU RN and Post -op Vital signs reviewed and stable  Post vital signs: Reviewed and stable  Complications: No apparent anesthesia complications

## 2011-10-13 NOTE — Preoperative (Signed)
Beta Blockers   Reason not to administer Beta Blockers:Not Applicable 
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
## 2011-10-14 ENCOUNTER — Inpatient Hospital Stay (HOSPITAL_COMMUNITY): Payer: Managed Care, Other (non HMO)

## 2011-10-14 ENCOUNTER — Other Ambulatory Visit: Payer: Self-pay | Admitting: Urology

## 2011-10-14 DIAGNOSIS — K439 Ventral hernia without obstruction or gangrene: Secondary | ICD-10-CM

## 2011-10-14 LAB — URINE CULTURE: Culture  Setup Time: 201304221349

## 2011-10-14 MED ORDER — GENTAMICIN SULFATE 40 MG/ML IJ SOLN
170.0000 mg | INTRAMUSCULAR | Status: DC
  Filled 2011-10-14: qty 4.25

## 2011-10-14 MED ORDER — MENTHOL 3 MG MT LOZG
1.0000 | LOZENGE | OROMUCOSAL | Status: DC | PRN
  Filled 2011-10-14: qty 9

## 2011-10-14 MED ORDER — OXYCODONE HCL 5 MG PO TABS: 5.0000 mg | ORAL_TABLET | ORAL | Status: AC | PRN

## 2011-10-14 MED ORDER — AMPICILLIN SODIUM 2 G IJ SOLR
2.0000 g | INTRAMUSCULAR | Status: DC
  Filled 2011-10-14: qty 2000

## 2011-10-14 MED ORDER — ONDANSETRON HCL 4 MG/2ML IJ SOLN
INTRAMUSCULAR | Status: AC
  Filled 2011-10-14: qty 2

## 2011-10-14 MED ORDER — DIPHENHYDRAMINE HCL 50 MG PO CAPS
50.0000 mg | ORAL_CAPSULE | Freq: Four times a day (QID) | ORAL | Status: DC | PRN
  Filled 2011-10-14: qty 1

## 2011-10-14 MED ORDER — TRAMADOL HCL 50 MG PO TABS
50.0000 mg | ORAL_TABLET | ORAL | Status: DC | PRN
  Filled 2011-10-14: qty 1

## 2011-10-14 MED ORDER — PHENOL 1.4 % MT LIQD
1.0000 | OROMUCOSAL | Status: DC | PRN
  Administered 2011-10-14: 1 via OROMUCOSAL
  Filled 2011-10-14: qty 177

## 2011-10-14 MED ORDER — SENNOSIDES-DOCUSATE SODIUM 8.6-50 MG PO TABS: 1.0000 | ORAL_TABLET | Freq: Two times a day (BID) | ORAL | Status: DC

## 2011-10-14 MED ORDER — DIPHENHYDRAMINE HCL 50 MG/ML IJ SOLN: 12.5000 mg | Freq: Four times a day (QID) | INTRAMUSCULAR | Status: DC | PRN

## 2011-10-14 MED ORDER — CEPHALEXIN 500 MG PO CAPS: 500.0000 mg | ORAL_CAPSULE | Freq: Three times a day (TID) | ORAL | Status: AC

## 2011-10-14 NOTE — Progress Notes (Signed)
GU  Patient with nausea and emesis. No fever or chills. Has some diffuse abdominal pressure. It is not pain, it is not sharp or dull; it does not radiate; nothing makes it better or worse.   Filed Vitals:   10/14/11 1126  BP: 126/82  Pulse: 74  Temp: 97.1 F (36.2 C)  Resp: 18    Gen: NAD Abd: soft, no rebound TTP, positive diffuse TTP, positive large right lateral abdominal hernia. CV: RRR Chest: CTA-B  A/P: Nausea.  -Will hold discharge. -NPO -Continue IV fluids. D/c narcotics. Add tramadol for discomfort; zofran for nausea. -CBC, CMP, lactate. -Consult to General Surgery
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
## 2011-10-14 NOTE — ED Provider Notes (Signed)
Medical screening examination/treatment/procedure(s) were performed by non-physician practitioner and as supervising physician I was immediately available for consultation/collaboration.    Railee Bonillas R Teia Freitas, MD 10/14/11 1845 
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
## 2011-10-14 NOTE — Progress Notes (Signed)
2 view abdomen does not show obstruction.  Gas in colon.  She is asking for supper.  I will start her on clears and see how she does.

## 2011-10-14 NOTE — Discharge Instructions (Signed)
DISCHARGE INSTRUCTIONS FOR KIDNEY STONES OR URETERAL STENT  MEDICATIONS:   1. DO NOT RESUME YOUR ASPIRIN, or any other medicines like ibuprofen, motrin, excedrin, advil, aleve, vitamin E, fish oil as these can all cause bleeding x 7 days.  2. Resume all your other meds from home - except do not take any other pain meds that you may have at home.  ACTIVITY 1. No strenuous activity x 1week 2. No driving while on narcotic pain medications 3. Drink plenty of water 4. Continue to walk at home - you can still get blood clots when you are at home, so keep active, but don't over do it. 5. May return to work in 3 days.  BATHING 1. You can shower. 2. If you have a string coming from your urethra:  The stent string is attached to your ureteral stent.  Do not pull on this.  If the stent gets pulled our partially before it is time to remove it, go ahead and remove the entire stent.  Call if you develop significant pain that lasts more than an hour.    SIGNS/SYMPTOMS TO CALL: 1. Please call us if you have a fever greater than 101.5, uncontrolled  nausea/vomiting, uncontrolled pain, dizziness, unable to urinate, chest pain, shortness of breath, leg swelling, leg pain, redness around wound, drainage from wound, or any other concerns or questions.  You can reach [place] at [phone].

## 2011-10-15 ENCOUNTER — Inpatient Hospital Stay (HOSPITAL_COMMUNITY): Payer: Managed Care, Other (non HMO)

## 2011-10-15 LAB — CBC
MCH: 25.4 pg — ABNORMAL LOW (ref 26.0–34.0)
MCHC: 30.6 g/dL (ref 30.0–36.0)
MCV: 83.3 fL (ref 78.0–100.0)
Platelets: 296 10*3/uL (ref 150–400)
RBC: 4.48 MIL/uL (ref 3.87–5.11)

## 2011-10-15 LAB — BASIC METABOLIC PANEL
BUN: 19 mg/dL (ref 6–23)
CO2: 21 mEq/L (ref 19–32)
Calcium: 9 mg/dL (ref 8.4–10.5)
GFR calc non Af Amer: 52 mL/min — ABNORMAL LOW (ref >90)
Glucose, Bld: 67 mg/dL — ABNORMAL LOW (ref 70–99)

## 2011-10-15 MED ORDER — FENTANYL CITRATE 0.05 MG/ML IJ SOLN
50.0000 ug | INTRAMUSCULAR | Status: DC | PRN
  Administered 2011-10-15 – 2011-10-16 (×3): 50 ug via INTRAVENOUS
  Filled 2011-10-15 (×3): qty 2

## 2011-10-15 MED ORDER — BISACODYL 10 MG RE SUPP
10.0000 mg | Freq: Two times a day (BID) | RECTAL | Status: DC
  Administered 2011-10-15: 10 mg via RECTAL
  Filled 2011-10-15: qty 1

## 2011-10-15 MED ORDER — BISACODYL 10 MG RE SUPP
10.0000 mg | Freq: Once | RECTAL | Status: AC
  Administered 2011-10-15: 10 mg via RECTAL
  Filled 2011-10-15: qty 1

## 2011-10-15 MED ORDER — CALCIUM CARBONATE ANTACID 500 MG PO CHEW
2.0000 | CHEWABLE_TABLET | ORAL | Status: AC
  Administered 2011-10-15: 400 mg via ORAL
  Filled 2011-10-15: qty 2

## 2011-10-15 MED ORDER — CALCIUM CARBONATE ANTACID 500 MG PO CHEW
2.0000 | CHEWABLE_TABLET | Freq: Once | ORAL | Status: AC | PRN
  Administered 2011-10-15: 400 mg via ORAL
  Filled 2011-10-15: qty 2

## 2011-10-15 MED ORDER — POLYETHYLENE GLYCOL 3350 17 G PO PACK
17.0000 g | PACK | Freq: Every day | ORAL | Status: DC
  Administered 2011-10-15: 17 g via ORAL
  Filled 2011-10-15 (×3): qty 1

## 2011-10-15 NOTE — Progress Notes (Signed)
Urology Progress Note  Subjective:     No acute urologic events overnight. Patient has not had BM in 3-4 days, but she normally requires digital stimulation. No flatus for 2 days. Mild nausea this morning; no emesis. Abdominal discomfort greatly improved.  ROS: Negative: chest pain  Objective:  Patient Vitals for the past 24 hrs:  BP Temp Temp src Pulse Resp SpO2  10/15/11 0513 144/68 mmHg 98.8 F (37.1 C) Oral 98  18  93 %  10/14/11 2159 146/77 mmHg 98.5 F (36.9 C) Oral 75  18  95 %  10/14/11 1126 126/82 mmHg 97.1 F (36.2 C) Oral 74  18  96 %  10/14/11 0935 108/62 mmHg 97.6 F (36.4 C) Oral 74  18  99 %    Physical Exam: General:  No acute distress, awake Cardiovascular:    [x]   S1/S2 present, RRR  []   Irregularly irregular Chest:  CTA-B Abdomen:               [x]  Soft, mild TTP in epigastric area, no TTP on right side of abdomen where hernia is located.  []  Soft, NTTP  []  Soft, appropriately TTP, incision(s) clean/dry/intact  Genitourinary: Foley draining urine with sediment.    I/O last 3 completed shifts: In: 4407.9 [P.O.:60; I.V.:4047.9; IV Piggyback:300] Out: 4400 [Urine:4400]  Recent Labs  [facility] 10/13/11 1033 10/13/11 0920   HGB 12.9 12.0   WBC -- 9.7   PLT -- 274    Recent Labs  [facility] 10/13/11 1033   NA 138   K 4.3   CL 112   CO2 --   BUN 29*   CREATININE 1.60*   CALCIUM --   GFRNONAA --   GFRAA --     No results found for this basename: PT:2,INR:2,APTT:2 in the last 72 hours   No components found with this basename: ABG:2    Length of stay: 2 days.  Assessment: Nephrolithiasis. Nausea, Abdominal hernia.   Plan: -Nurse to help with rectal stimulation for BM. -Patient will need labs redrawn. Have asked nurse to call vascular access for help. -Appreciate General Surgery input -Discharge disposition: await General Surgery input.   Natalia Leatherwood, MD [phone]

## 2011-10-15 NOTE — Progress Notes (Signed)
Suppository effective - patient had a medium size bowel movement.

## 2011-10-15 NOTE — Progress Notes (Signed)
Pt refused to have labs drawn this am. Pt has voiced to me her concerns about possibly having another surgery, at this time she denies wanting to have this done. Pt is unclear about what is going to be done, and is fearful of the unknown. Pt at this time says she just really wants to go home. Will continue to reassure and monitor pt. Thanks

## 2011-10-15 NOTE — Progress Notes (Signed)
Patient has had vague feeling of abdominal pain after taking Miralax, large amount of flatus today.   Abdomen - no peritoneal signs; no palpable masses.  Likely, constipated due to hernia, paraplegia, but not currently obstructed.  Will follow.  Wilmon Arms. Corliss Skains, MD, [facility] Surgery  10/15/2011 5:51 PM

## 2011-10-15 NOTE — Progress Notes (Signed)
   CARE MANAGEMENT NOTE 10/15/2011  Patient:  [name],[name]   Account Number:  [id number]  Date Initiated:  10/15/2011  Documentation initiated by:  [facility]  Subjective/Objective Assessment:   ADMITTED W/R HYDRONEPHROSIS.ZO:[id number].     Action/Plan:   FROM HOME W/FAMILY   Anticipated DC Date:  10/17/2011   Anticipated DC Plan:  HOME/SELF CARE         Choice offered to / List presented to:             Status of service:  In process, will continue to follow Medicare Important Message given?   (If response is "NO", the following Medicare IM given date fields will be blank) Date Medicare IM given:   Date Additional Medicare IM given:    Discharge Disposition:    Per UR Regulation:  Reviewed for med. necessity/level of care/duration of stay  If discussed at Long Length of Stay Meetings, dates discussed:    Comments:  10/15/11 Dia Jefferys RN,BSN NCM [phone] URO FOLLOWING.POD#2 CYSTOSCOPY/STENTS.NOTED NO BM.

## 2011-10-15 NOTE — Progress Notes (Signed)
Provided rectal stimulation, unable to feel for stool and patient did not have a bowel movement at this time.

## 2011-10-15 NOTE — Progress Notes (Addendum)
GU  Patient complains of worsening abdominal pain. She had a large amount of flatus earlier today. No BM. No nausea at this time.  It is sharp and intermittent. It is located in her epigastric and right abdomen. It is worse with palpation. She has not had anything by mouth since about 11:00 am today. I spoke with Dr. Corliss Skains who agrees that this is not likely strangulated bowel.  Filed Vitals:   10/15/11 1355  BP: 142/81  Pulse: 74  Temp: 98.4 F (36.9 C)  Resp: 18   Gen: Mild distress CV: RRR Abd: soft, no rebound TTP, positive TTP over epigastric and right abdomen. Difficult to palpate hernia due to patient's obesity. Chest: CTA-B GU: Foley draining amber urine.  A/P: Abdominal pain. -Continue bowel regiment. -Continue CLD.

## 2011-10-15 NOTE — Progress Notes (Signed)
Patient is requesting IV pain medication instead of PO. Paged PA.
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
## 2011-10-16 MED ORDER — CLONIDINE HCL 0.1 MG PO TABS
0.1000 mg | ORAL_TABLET | Freq: Once | ORAL | Status: AC
  Administered 2011-10-16: 0.1 mg via ORAL
  Filled 2011-10-16: qty 1

## 2011-10-16 MED ORDER — AMLODIPINE BESYLATE 10 MG PO TABS
10.0000 mg | ORAL_TABLET | Freq: Every day | ORAL | Status: DC
  Administered 2011-10-16 – 2011-10-17 (×2): 10 mg via ORAL
  Filled 2011-10-16 (×2): qty 1

## 2011-10-16 MED ORDER — METOPROLOL TARTRATE 1 MG/ML IV SOLN: 5.0000 mg | Freq: Once | INTRAVENOUS | Status: DC

## 2011-10-16 MED ORDER — BISACODYL 10 MG RE SUPP: 10.0000 mg | Freq: Two times a day (BID) | RECTAL | Status: AC

## 2011-10-16 MED ORDER — POLYETHYLENE GLYCOL 3350 17 G PO PACK: 17.0000 g | PACK | Freq: Every day | ORAL | Status: AC
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
## 2011-10-16 NOTE — Discharge Summary (Deleted)
Physician Discharge Summary  Patient ID: [name] MRN: [id number] DOB/AGE: 09/06/1960 51 y.o.  Admit date: 10/13/2011 Discharge date: 10/16/2011  Admission Diagnoses: Bilateral nephrolithiasis  Discharge Diagnoses:  Bilateral nephrolithiasis Constipation Abdominal hernia  Discharged Condition: good  Hospital Course:  Patient admitted following bilateral ureter stent placement for bilateral nephrolithiasis. The next day she did well in the early morning, but by late morning she began to have nausea and emesis. She also developed abdominal pain. There was concern over her large right abdominal hernia and General Surgery was consulted to evaluate. Plain film x-ray showed no evidence of obstruction. She was made NPO and slowly advanced back to clears. Her bowel regiment was increased resulting in positive BM and flatus, which relieved her abdominal pain. She was able to tolerate a regular diet.  She did experience some asymptomatic hypertension before she left. She was given a dose of clonidine and started on Norvasc 10 mg. I gave her enough to last for 3 months and recommended she follow up with her primary care provider. She stated she did not have a primary care provider and I informed her that she needs to establish care for the treatment of her multiple medical problems and management of her health.  Consults: general surgery  Significant Diagnostic Studies: radiology: KUB: No evidence of bowel obstruction and CT scan: bilateral nephrolithiasis.  Treatments: IV hydration, antibiotics: gentamycin and ampicillin and surgery: Cystoscopy with bilateral ureter stent placement.  Discharge Exam: Blood pressure 152/84, pulse 75, temperature 97.9 F (36.6 C), temperature source Oral, resp. rate 18, height 5' (1.524 m), weight 114.1 kg (251 lb 8.7 oz), SpO2 94.00%. Refer to PE from progress note from date of discharge.  Disposition: Home under care of spouse.  Discharge Orders    Future  Appointments: Provider: Department: Dept Phone: Center:   11/12/2011 9:30 AM [facility] 1 [facility] Rad [phone] [place]     Future Orders Please Complete By Expires   Discharge patient        Medication List  As of 10/16/2011  6:48 AM   TAKE these medications         acetaminophen 500 MG tablet   Commonly known as: TYLENOL   Take 500 mg by mouth every 6 (six) hours as needed. pain      bisacodyl 10 MG suppository   Commonly known as: DULCOLAX   Place 1 suppository (10 mg total) rectally 2 (two) times daily.      cephALEXin 500 MG capsule   Commonly known as: KEFLEX   Take 1 capsule (500 mg total) by mouth 3 (three) times daily.      naproxen sodium 220 MG tablet   Commonly known as: ANAPROX   Take 220 mg by mouth 2 (two) times daily with a meal.      oxyCODONE 5 MG immediate release tablet   Commonly known as: Oxy IR/ROXICODONE   Take 1-2 tablets (5-10 mg total) by mouth every 4 (four) hours as needed.      polyethylene glycol packet   Commonly known as: MIRALAX / GLYCOLAX   Take 17 g by mouth daily.      senna-docusate 8.6-50 MG per tablet   Commonly known as: Senokot-S   Take 1 tablet by mouth 2 (two) times daily.           Follow-up Information    Follow up with Milford Cage, MD. (You will be contacted wit follow up instructions.)    Contact information:  [address] [facility] [place] [address] [phone]         Follow up with Dr. Corliss Skains for elective hernia repair.  Followup with primary care physician for hypertension.  Signed: Milford Cage 10/16/2011, 6:48 AM

## 2011-10-16 NOTE — Progress Notes (Signed)
Dr Isabel Caprice notified patient has been stuck X4 unable to obtain iv assess. IV team unable to obtain as well. Patient is refusing to be stuck anymore. Will continue to monitor.

## 2011-10-16 NOTE — Progress Notes (Signed)
Feeling better.  Positive bowel movements  Discharge today. Follow-up as outpatient to discuss hernia repair.  Wilmon Arms. Corliss Skains, MD, [facility] Surgery  10/16/2011 2:29 PM

## 2011-10-16 NOTE — Progress Notes (Signed)
Pt BP 180/91 HR 67.  MD notified. Will continue to monitor.

## 2011-10-16 NOTE — Progress Notes (Signed)
Urology Progress Note  Subjective:     No acute urologic events overnight. Positive flatus and BM resulting in relief of her abdominal pain. She still has crampy feelings in her abdomen, which is consistent with constipation. No nausea or emesis. Tolerating CLD.  ROS: Negative: chest pain  Objective:  Patient Vitals for the past 24 hrs:  BP Temp Temp src Pulse Resp SpO2  10/16/11 0513 152/84 mmHg 97.9 F (36.6 C) Oral 75  18  94 %  10/15/11 2058 151/81 mmHg 98.6 F (37 C) Oral 73  18  95 %  10/15/11 1355 142/81 mmHg 98.4 F (36.9 C) Oral 74  18  93 %    Physical Exam: General:  No acute distress, awake Cardiovascular:    [x]   S1/S2 present, RRR  []   Irregularly irregular Chest:  CTA-B Abdomen:               [x]  Soft, NTTP  []  Soft, appropriately TTP, incision(s) clean/dry/intact  Genitourinary: Foley draining clear urine   I/O last 3 completed shifts: In: 4827.9 [P.O.:780; I.V.:4047.9] Out: 5075 [Urine:5075]  Recent Labs  [facility] 10/15/11 0945 10/13/11 1033 10/13/11 0920   HGB 11.4* 12.9 --   WBC 5.3 -- 9.7   PLT 296 -- 274    Recent Labs  Basename 10/15/11 0945 10/13/11 1033   NA 141 138   K 4.7 4.3   CL 109 112   CO2 21 --   BUN 19 29*   CREATININE 1.20* 1.60*   CALCIUM 9.0 --   GFRNONAA 52* --   GFRAA 60* --     No results found for this basename: PT:2,INR:2,APTT:2 in the last 72 hours   No components found with this basename: ABG:2    Length of stay: 3 days.  Assessment: Nephrolithiasis.   Plan: -Advance to regular diet. -Likely discharge home today. -Appreciate General Surgery input    Natalia Leatherwood, MD [phone]

## 2011-10-17 MED ORDER — VITAMINS A & D EX OINT
TOPICAL_OINTMENT | CUTANEOUS | Status: AC
  Administered 2011-10-17: 06:00:00
  Filled 2011-10-17: qty 5

## 2011-10-17 MED ORDER — AMLODIPINE BESYLATE 10 MG PO TABS: 10.0000 mg | ORAL_TABLET | Freq: Every day | ORAL | Status: DC
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
## 2011-10-17 NOTE — Progress Notes (Signed)
   CARE MANAGEMENT NOTE 10/17/2011  Patient:  Fonseca,Rosealyn A   Account Number:  [id number]  Date Initiated:  10/15/2011  Documentation initiated by:  [facility]  Subjective/Objective Assessment:   ADMITTED W/R HYDRONEPHROSIS.ZO:[id number].     Action/Plan:   FROM HOME W/FAMILY   Anticipated DC Date:  10/17/2011   Anticipated DC Plan:  HOME/SELF CARE      DC Planning Services  PCP issues      Choice offered to / List presented to:             Status of service:  Completed, signed off Medicare Important Message given?   (If response is "NO", the following Medicare IM given date fields will be blank) Date Medicare IM given:   Date Additional Medicare IM given:    Discharge Disposition:  HOME/SELF CARE  Per UR Regulation:  Reviewed for med. necessity/level of care/duration of stay  If discussed at Long Length of Stay Meetings, dates discussed:    Comments:  10/17/11 [facility] RN,BSN NCM [phone] PROVIDED PATIENT PCP LISTINGS W/IN INSURANCE PLAN/15 MILE RADIUS.MD NOTIFIED.  10/15/11 Brentlee Sciara RN,BSN NCM [phone] [facility] FOLLOWING.POD#2 CYSTOSCOPY/STENTS.NOTED NO BM.

## 2011-10-17 NOTE — Progress Notes (Signed)
Discharge today per primary team  Wilmon Arms. Corliss Skains, MD, [facility] Surgery  10/17/2011 10:29 AM

## 2011-10-17 NOTE — Discharge Summary (Signed)
Physician Discharge Summary  Patient ID: [name] MRN: [id number] DOB/AGE: 1960-09-17 51 y.o.  Admit date: 10/13/2011 Discharge date: 10/17/2011  Admission Diagnoses: Nephrolithiasis.  Discharge Diagnoses:  Nephrolithiasis Constipation HTN  Discharged Condition: good  Hospital Course:  Patient admitted following bilateral ureter stent placement for bilateral nephrolithiasis. The next day she did well in the early morning, but by late morning she began to have nausea and emesis. She also developed abdominal pain. There was concern over her large right abdominal hernia and General Surgery was consulted to evaluate. Plain film x-ray showed no evidence of obstruction. She was made NPO and slowly advanced back to clears. Her bowel regiment was increased resulting in positive BM and flatus, which relieved her abdominal pain. She was able to tolerate a regular diet.  She did experience some asymptomatic hypertension the day she was going to be discharged. She was given oral clonidine which was able to control her blood pressure, but it was felt that her discharge should be held to observe and ensure her blood pressure did not become worse and make sure she did not become symptomatic. She was kept overnight on telemetry. She was started on Norvasc 10 mg. The following day her blood pressure remained stable. Social work was consulted to help the patient establish primary care physician. She was discharged with a prescription for Norvasc 10 mg [facility] pharmacy electronically for my office and she was given enough to last her for 3 months in order to establish care. She was also given prescription for my office for methylprednisone and ranitidine to prevent any contrast allergy for her upcoming interventional radiology procedure.    Consults: general surgery  Significant Diagnostic Studies: radiology: KUB: No evidence of bowel obstruction and CT scan: bilateral nephrolithiasis.   Treatments: surgery:  Cystoscopy with bilateral ureter stent placement.   Discharge Exam: Blood pressure 137/82, pulse 62, temperature 98.2 F (36.8 C), temperature source Oral, resp. rate 61, height 5' (1.524 m), weight 114.1 kg (251 lb 8.7 oz), SpO2 95.00%. See PE from date of discharge.  Disposition: Home under the care of her spouse.  Discharge Orders    Future Appointments: Provider: Department: Dept Phone: Center:   11/12/2011 9:30 AM [facility]Ir 1 [facility]-Interv Rad [phone] [place]     Future Orders Please Complete By Expires   Discharge patient      Discharge patient      Discharge patient        Medication List  As of 10/17/2011 10:31 AM   TAKE these medications         acetaminophen 500 MG tablet   Commonly known as: TYLENOL   Take 500 mg by mouth every 6 (six) hours as needed. pain      amLODipine 10 MG tablet   Commonly known as: NORVASC   Take 1 tablet (10 mg total) by mouth daily.      bisacodyl 10 MG suppository   Commonly known as: DULCOLAX   Place 1 suppository (10 mg total) rectally 2 (two) times daily.      cephALEXin 500 MG capsule   Commonly known as: KEFLEX   Take 1 capsule (500 mg total) by mouth 3 (three) times daily.      naproxen sodium 220 MG tablet   Commonly known as: ANAPROX   Take 220 mg by mouth 2 (two) times daily with a meal.      oxyCODONE 5 MG immediate release tablet   Commonly known as: Oxy IR/ROXICODONE   Take  1-2 tablets (5-10 mg total) by mouth every 4 (four) hours as needed.      polyethylene glycol packet   Commonly known as: MIRALAX / GLYCOLAX   Take 17 g by mouth daily.      senna-docusate 8.6-50 MG per tablet   Commonly known as: Senokot-S   Take 1 tablet by mouth 2 (two) times daily.           Follow-up Information    Follow up with Milford Cage, MD. (You will be contacted wit follow up instructions.)    Contact information:   [address] [facility] Floor [facility] [place] [address] [phone]       Call Wynona Luna., MD. (to make appointment for hernia after discharge)    Contact information:   [facility] [address] [place] [address] [phone]       Follow up with Primary Care. (Establish care with a primary doctor for management of your high blood pressure.)          Signed: Milford Cage 10/17/2011, 10:31 AM

## 2011-10-17 NOTE — Progress Notes (Signed)
Urology Progress Note  Subjective:     No acute urologic events overnight. Tolerating regular diet. Positive flatus & BM. No abdominal pain, HA, changes in vision. Discharge held yesterday afternoon due to HTN. It is now controlled with PO medications. Unable to start an IV; as BP is currently controlled and discharge is anticipated, I do not feel a PICC is appropriate at this time.  ROS: Negative: chest pain  Objective:  Patient Vitals for the past 24 hrs:  BP Temp Temp src Pulse Resp SpO2  10/17/11 0608 135/71 mmHg 98.2 F (36.8 C) Oral 78  - 95 %  10/17/11 0000 162/73 mmHg - - - - -  10/16/11 2234 - 98.6 F (37 C) Oral 76  - 95 %  10/16/11 2050 172/81 mmHg - - 71  - -  10/16/11 1752 194/100 mmHg - - 66  - -  10/16/11 1748 201/102 mmHg - - 66  - -  10/16/11 1452 186/98 mmHg - - - - -  10/16/11 1410 180/91 mmHg 98.3 F (36.8 C) Oral 67  61  94 %    Physical Exam: General:  No acute distress, awake Cardiovascular:    [x]   S1/S2 present, RRR  []   Irregularly irregular Chest:  CTA-B Abdomen:               [x]  Soft, NTTP  []  Soft, appropriately TTP, incision(s) clean/dry/intact  Genitourinary: Foley draining clear urine   I/O last 3 completed shifts: In: 3100 [P.O.:600; I.V.:2500] Out: 4875 [Urine:4875]  Recent Labs  [facility] 10/15/11 0945   HGB 11.4*   WBC 5.3   PLT 296    Recent Labs  Basename 10/15/11 0945   NA 141   K 4.7   CL 109   CO2 21   BUN 19   CREATININE 1.20*   CALCIUM 9.0   GFRNONAA 52*   GFRAA 60*     No results found for this basename: PT:2,INR:2,APTT:2 in the last 72 hours   No components found with this basename: ABG:2    Length of stay: 4 days.  Assessment: Nephrolithiasis. HTN  Plan: -Social Work consult to help patient establish primary health care provider. -Continue norvasc 10 mg -Likely discharge home today.   Natalia Leatherwood, MD [phone]

## 2011-10-20 ENCOUNTER — Encounter (HOSPITAL_COMMUNITY): Payer: Self-pay | Admitting: Urology

## 2011-10-23 ENCOUNTER — Other Ambulatory Visit: Payer: Self-pay | Admitting: Urology

## 2011-10-23 DIAGNOSIS — N200 Calculus of kidney: Secondary | ICD-10-CM

## 2011-10-31 ENCOUNTER — Encounter (HOSPITAL_COMMUNITY): Payer: Self-pay | Admitting: Pharmacy Technician

## 2011-11-05 ENCOUNTER — Ambulatory Visit (HOSPITAL_COMMUNITY): Admission: RE | Admit: 2011-11-05 | Payer: Medicare Other | Source: Ambulatory Visit

## 2011-11-05 ENCOUNTER — Encounter (HOSPITAL_COMMUNITY): Payer: Self-pay | Admitting: *Deleted

## 2011-11-06 ENCOUNTER — Other Ambulatory Visit: Payer: Self-pay | Admitting: Urology

## 2011-11-11 ENCOUNTER — Telehealth (HOSPITAL_COMMUNITY): Payer: Self-pay | Admitting: Urology

## 2011-11-11 ENCOUNTER — Other Ambulatory Visit: Payer: Self-pay | Admitting: Radiology

## 2011-11-11 MED ORDER — SODIUM CHLORIDE 0.9 % IV SOLN
2.0000 g | INTRAVENOUS | Status: AC
  Administered 2011-11-12: 2 g via INTRAVENOUS
  Filled 2011-11-11: qty 2000

## 2011-11-11 MED ORDER — GENTAMICIN SULFATE 40 MG/ML IJ SOLN
1.5000 mg/kg | INTRAVENOUS | Status: AC
  Administered 2011-11-12: 171.15 mg via INTRAVENOUS
  Filled 2011-11-11: qty 4.28

## 2011-11-12 ENCOUNTER — Ambulatory Visit (HOSPITAL_COMMUNITY): Payer: Managed Care, Other (non HMO) | Admitting: Anesthesiology

## 2011-11-12 ENCOUNTER — Ambulatory Visit (HOSPITAL_COMMUNITY)
Admission: RE | Admit: 2011-11-12 | Discharge: 2011-11-12 | Disposition: A | Discharge status: Home / Self Care | Payer: Managed Care, Other (non HMO) | Source: Ambulatory Visit | Attending: Urology | Admitting: Urology

## 2011-11-12 ENCOUNTER — Other Ambulatory Visit: Payer: Self-pay | Admitting: Urology

## 2011-11-12 ENCOUNTER — Ambulatory Visit (HOSPITAL_COMMUNITY): Payer: Managed Care, Other (non HMO)

## 2011-11-12 ENCOUNTER — Encounter (HOSPITAL_COMMUNITY)
Admission: RE | Disposition: A | Discharge status: Home / Self Care | Payer: Self-pay | Source: Ambulatory Visit | Attending: Urology

## 2011-11-12 ENCOUNTER — Encounter (HOSPITAL_COMMUNITY): Payer: Self-pay | Admitting: Radiology

## 2011-11-12 ENCOUNTER — Ambulatory Visit (HOSPITAL_COMMUNITY)
Admission: RE | Admit: 2011-11-12 | Discharge: 2011-11-13 | Disposition: A | Discharge status: Home / Self Care | Payer: Managed Care, Other (non HMO) | Source: Ambulatory Visit | Attending: Urology | Admitting: Urology

## 2011-11-12 ENCOUNTER — Other Ambulatory Visit: Payer: Self-pay | Admitting: Radiology

## 2011-11-12 ENCOUNTER — Encounter (HOSPITAL_COMMUNITY): Payer: Self-pay | Admitting: Anesthesiology

## 2011-11-12 ENCOUNTER — Other Ambulatory Visit (HOSPITAL_COMMUNITY): Payer: Self-pay | Admitting: Urology

## 2011-11-12 DIAGNOSIS — N200 Calculus of kidney: Secondary | ICD-10-CM

## 2011-11-12 DIAGNOSIS — Z79899 Other long term (current) drug therapy: Secondary | ICD-10-CM | POA: Insufficient documentation

## 2011-11-12 DIAGNOSIS — K439 Ventral hernia without obstruction or gangrene: Secondary | ICD-10-CM | POA: Insufficient documentation

## 2011-11-12 DIAGNOSIS — S88919A Complete traumatic amputation of unspecified lower leg, level unspecified, initial encounter: Secondary | ICD-10-CM | POA: Insufficient documentation

## 2011-11-12 HISTORY — DX: Reserved for inherently not codable concepts without codable children: IMO0001

## 2011-11-12 HISTORY — DX: Essential (primary) hypertension: I10

## 2011-11-12 HISTORY — DX: Encounter for other specified aftercare: Z51.89

## 2011-11-12 HISTORY — PX: NEPHROLITHOTOMY: SHX5134

## 2011-11-12 LAB — ABO/RH: ABO/RH(D): O POS

## 2011-11-12 LAB — BASIC METABOLIC PANEL
Calcium: 9 mg/dL (ref 8.4–10.5)
Chloride: 107 mEq/L (ref 96–112)
Creatinine, Ser: 0.9 mg/dL (ref 0.50–1.10)
GFR calc Af Amer: 86 mL/min — ABNORMAL LOW (ref >90)
GFR calc non Af Amer: 74 mL/min — ABNORMAL LOW (ref >90)

## 2011-11-12 LAB — CBC
Platelets: 305 10*3/uL (ref 150–400)
RDW: 16.6 % — ABNORMAL HIGH (ref 11.5–15.5)
WBC: 5.9 10*3/uL (ref 4.0–10.5)

## 2011-11-12 LAB — TYPE AND SCREEN
ABO/RH(D): O POS
Antibody Screen: NEGATIVE

## 2011-11-12 SURGERY — NEPHROLITHOTOMY PERCUTANEOUS
Anesthesia: General | Site: Flank | Laterality: Right | Wound class: Clean

## 2011-11-12 MED ORDER — ACETAMINOPHEN 10 MG/ML IV SOLN
1000.0000 mg | Freq: Four times a day (QID) | INTRAVENOUS | Status: DC
  Administered 2011-11-12 – 2011-11-13 (×3): 1000 mg via INTRAVENOUS
  Filled 2011-11-12 (×4): qty 100

## 2011-11-12 MED ORDER — MIDAZOLAM HCL 2 MG/2ML IJ SOLN
INTRAMUSCULAR | Status: AC
  Filled 2011-11-12: qty 2

## 2011-11-12 MED ORDER — DIPHENHYDRAMINE HCL 25 MG PO CAPS
25.0000 mg | ORAL_CAPSULE | Freq: Four times a day (QID) | ORAL | Status: DC | PRN
  Filled 2011-11-12: qty 1

## 2011-11-12 MED ORDER — PROPOFOL 10 MG/ML IV BOLUS
INTRAVENOUS | Status: DC | PRN
  Administered 2011-11-12: 150 mg via INTRAVENOUS

## 2011-11-12 MED ORDER — MIDAZOLAM HCL 5 MG/5ML IJ SOLN
INTRAMUSCULAR | Status: AC | PRN
  Administered 2011-11-12: 2 mg via INTRAVENOUS

## 2011-11-12 MED ORDER — SODIUM CHLORIDE 0.9 % IR SOLN
Status: DC | PRN
  Administered 2011-11-12: 1000 mL

## 2011-11-12 MED ORDER — IOHEXOL 300 MG/ML  SOLN
10.0000 mL | Freq: Once | INTRAMUSCULAR | Status: AC | PRN
  Administered 2011-11-12: 10 mL

## 2011-11-12 MED ORDER — LIDOCAINE HCL 1 % IJ SOLN
INTRAMUSCULAR | Status: AC
  Filled 2011-11-12: qty 40

## 2011-11-12 MED ORDER — MEPERIDINE HCL 50 MG/ML IJ SOLN: 6.2500 mg | INTRAMUSCULAR | Status: DC | PRN

## 2011-11-12 MED ORDER — NEOSTIGMINE METHYLSULFATE 1 MG/ML IJ SOLN
INTRAMUSCULAR | Status: DC | PRN
  Administered 2011-11-12: 4 mg via INTRAVENOUS

## 2011-11-12 MED ORDER — DIPHENHYDRAMINE HCL 50 MG/ML IJ SOLN
INTRAMUSCULAR | Status: AC
  Filled 2011-11-12: qty 1

## 2011-11-12 MED ORDER — FENTANYL CITRATE 0.05 MG/ML IJ SOLN
INTRAMUSCULAR | Status: DC | PRN
  Administered 2011-11-12 (×4): 50 ug via INTRAVENOUS
  Administered 2011-11-12: 100 ug via INTRAVENOUS

## 2011-11-12 MED ORDER — GLYCOPYRROLATE 0.2 MG/ML IJ SOLN
INTRAMUSCULAR | Status: DC | PRN
  Administered 2011-11-12: .6 mg via INTRAVENOUS

## 2011-11-12 MED ORDER — LACTATED RINGERS IV SOLN: INTRAVENOUS | Status: DC

## 2011-11-12 MED ORDER — METOPROLOL TARTRATE 1 MG/ML IV SOLN: 5.0000 mg | Freq: Four times a day (QID) | INTRAVENOUS | Status: DC | PRN

## 2011-11-12 MED ORDER — IOHEXOL 300 MG/ML  SOLN
INTRAMUSCULAR | Status: AC
  Filled 2011-11-12: qty 1

## 2011-11-12 MED ORDER — MUPIROCIN 2 % EX OINT
TOPICAL_OINTMENT | CUTANEOUS | Status: AC
  Filled 2011-11-12: qty 22

## 2011-11-12 MED ORDER — LACTATED RINGERS IV SOLN
INTRAVENOUS | Status: DC | PRN
  Administered 2011-11-12: 13:00:00 via INTRAVENOUS

## 2011-11-12 MED ORDER — MORPHINE SULFATE 10 MG/ML IJ SOLN: 2.0000 mg | INTRAMUSCULAR | Status: DC | PRN

## 2011-11-12 MED ORDER — GENTAMICIN SULFATE 40 MG/ML IJ SOLN
325.0000 mg | INTRAVENOUS | Status: DC
  Administered 2011-11-13: 325 mg via INTRAVENOUS
  Filled 2011-11-12: qty 8.13

## 2011-11-12 MED ORDER — ONDANSETRON HCL 4 MG/2ML IJ SOLN
INTRAMUSCULAR | Status: DC | PRN
  Administered 2011-11-12: 4 mg via INTRAVENOUS

## 2011-11-12 MED ORDER — SENNOSIDES-DOCUSATE SODIUM 8.6-50 MG PO TABS
1.0000 | ORAL_TABLET | Freq: Two times a day (BID) | ORAL | Status: DC
  Filled 2011-11-12 (×4): qty 1

## 2011-11-12 MED ORDER — MIDAZOLAM HCL 2 MG/2ML IJ SOLN
INTRAMUSCULAR | Status: AC
  Filled 2011-11-12: qty 4

## 2011-11-12 MED ORDER — DIPHENHYDRAMINE HCL (SLEEP) 25 MG PO TABS: 25.0000 mg | ORAL_TABLET | Freq: Four times a day (QID) | ORAL | Status: DC | PRN

## 2011-11-12 MED ORDER — HYDRALAZINE HCL 20 MG/ML IJ SOLN
INTRAMUSCULAR | Status: DC | PRN
  Administered 2011-11-12: 5 mg via INTRAVENOUS

## 2011-11-12 MED ORDER — IOHEXOL 300 MG/ML  SOLN
INTRAMUSCULAR | Status: DC | PRN
  Administered 2011-11-12: 10 mL via INTRAVENOUS

## 2011-11-12 MED ORDER — FENTANYL CITRATE 0.05 MG/ML IJ SOLN
INTRAMUSCULAR | Status: AC
  Filled 2011-11-12: qty 4

## 2011-11-12 MED ORDER — CIPROFLOXACIN IN D5W 400 MG/200ML IV SOLN
400.0000 mg | Freq: Once | INTRAVENOUS | Status: AC
  Administered 2011-11-12: 400 mg via INTRAVENOUS

## 2011-11-12 MED ORDER — SODIUM CHLORIDE 0.9 % IV SOLN
INTRAVENOUS | Status: DC
  Administered 2011-11-12: 16:00:00 via INTRAVENOUS

## 2011-11-12 MED ORDER — DIPHENHYDRAMINE HCL 50 MG/ML IJ SOLN
50.0000 mg | Freq: Once | INTRAMUSCULAR | Status: AC
  Administered 2011-11-12: 50 mg via INTRAVENOUS

## 2011-11-12 MED ORDER — HYDROMORPHONE HCL PF 1 MG/ML IJ SOLN: 0.2500 mg | INTRAMUSCULAR | Status: DC | PRN

## 2011-11-12 MED ORDER — CIPROFLOXACIN IN D5W 400 MG/200ML IV SOLN
INTRAVENOUS | Status: AC
  Filled 2011-11-12: qty 200

## 2011-11-12 MED ORDER — SODIUM CHLORIDE 0.9 % IR SOLN
Status: DC | PRN
  Administered 2011-11-12: 3000 mL

## 2011-11-12 MED ORDER — ROCURONIUM BROMIDE 100 MG/10ML IV SOLN
INTRAVENOUS | Status: DC | PRN
  Administered 2011-11-12: 30 mg via INTRAVENOUS

## 2011-11-12 MED ORDER — FENTANYL CITRATE 0.05 MG/ML IJ SOLN
INTRAMUSCULAR | Status: AC | PRN
  Administered 2011-11-12: 100 ug via INTRAVENOUS

## 2011-11-12 MED ORDER — ACETAMINOPHEN 325 MG PO TABS: 650.0000 mg | ORAL_TABLET | ORAL | Status: DC | PRN

## 2011-11-12 MED ORDER — SODIUM CHLORIDE 0.9 % IV SOLN
1.0000 g | Freq: Four times a day (QID) | INTRAVENOUS | Status: AC
  Administered 2011-11-12 – 2011-11-13 (×3): 1 g via INTRAVENOUS
  Filled 2011-11-12 (×3): qty 1000

## 2011-11-12 MED ORDER — ONDANSETRON HCL 4 MG/2ML IJ SOLN: 4.0000 mg | INTRAMUSCULAR | Status: DC | PRN

## 2011-11-12 MED ORDER — OXYCODONE HCL 5 MG PO TABS
5.0000 mg | ORAL_TABLET | ORAL | Status: DC | PRN
  Administered 2011-11-12: 10 mg via ORAL
  Filled 2011-11-12: qty 2

## 2011-11-12 MED ORDER — PROMETHAZINE HCL 25 MG/ML IJ SOLN: 6.2500 mg | INTRAMUSCULAR | Status: DC | PRN

## 2011-11-12 MED ORDER — FENTANYL CITRATE 0.05 MG/ML IJ SOLN
INTRAMUSCULAR | Status: AC
  Filled 2011-11-12: qty 2

## 2011-11-12 SURGICAL SUPPLY — 56 items
APL SKNCLS STERI-STRIP NONHPOA (GAUZE/BANDAGES/DRESSINGS) ×1
BAG URINE DRAINAGE (UROLOGICAL SUPPLIES) ×2 IMPLANT
BANDAGE GAUZE ELAST BULKY 4 IN (GAUZE/BANDAGES/DRESSINGS) ×1 IMPLANT
BASKET STONE NITINOL 3FRX115MB (UROLOGICAL SUPPLIES) IMPLANT
BASKET ZERO TIP NITINOL 2.4FR (BASKET) ×1 IMPLANT
BENZOIN TINCTURE PRP APPL 2/3 (GAUZE/BANDAGES/DRESSINGS) ×3 IMPLANT
BSKT STON RTRVL ZERO TP 2.4FR (BASKET) ×1
CATCHER STONE W/TUBE ADAPTER (UROLOGICAL SUPPLIES) ×1 IMPLANT
CATH FOLEY 2W COUNCIL 20FR 5CC (CATHETERS) ×1 IMPLANT
CATH FOLEY 2WAY SLVR  5CC 18FR (CATHETERS) ×1
CATH FOLEY 2WAY SLVR 5CC 18FR (CATHETERS) ×1 IMPLANT
CATH ROBINSON RED A/P 20FR (CATHETERS) IMPLANT
CATH URET 5FR 28IN OPEN ENDED (CATHETERS) ×2 IMPLANT
CATH URET DUAL LUMEN 6-10FR 50 (CATHETERS) ×2 IMPLANT
CATH X-FORCE N30 NEPHROSTOMY (TUBING) ×2 IMPLANT
CHLORAPREP W/TINT 26ML (MISCELLANEOUS) ×2 IMPLANT
CLOTH BEACON ORANGE TIMEOUT ST (SAFETY) ×2 IMPLANT
COVER SURGICAL LIGHT HANDLE (MISCELLANEOUS) ×2 IMPLANT
DRAPE C-ARM 42X72 X-RAY (DRAPES) ×2 IMPLANT
DRAPE CAMERA CLOSED 9X96 (DRAPES) ×2 IMPLANT
DRAPE LINGEMAN PERC (DRAPES) ×2 IMPLANT
DRAPE SURG IRRIG POUCH 19X23 (DRAPES) ×2 IMPLANT
DRAPE UTILITY XL STRL (DRAPES) ×1 IMPLANT
DRSG TEGADERM 4X4.75 (GAUZE/BANDAGES/DRESSINGS) ×1 IMPLANT
GLOVE BIOGEL PI IND STRL 7.5 (GLOVE) ×1 IMPLANT
GLOVE BIOGEL PI INDICATOR 7.5 (GLOVE)
GLOVE ECLIPSE 7.0 STRL STRAW (GLOVE) ×2 IMPLANT
GOWN PREVENTION PLUS XLARGE (GOWN DISPOSABLE) ×1 IMPLANT
GUIDEWIRE STR DUAL SENSOR (WIRE) ×2 IMPLANT
KIT BASIN OR (CUSTOM PROCEDURE TRAY) ×2 IMPLANT
LASER FIBER DISP (UROLOGICAL SUPPLIES) IMPLANT
LASER FIBER DISP 1000U (UROLOGICAL SUPPLIES) IMPLANT
MANIFOLD NEPTUNE II (INSTRUMENTS) ×2 IMPLANT
NS IRRIG 1000ML POUR BTL (IV SOLUTION) ×2 IMPLANT
PACK BASIC VI WITH GOWN DISP (CUSTOM PROCEDURE TRAY) ×2 IMPLANT
PROBE EHL 9 FR 470CM (MISCELLANEOUS) IMPLANT
PROBE LITHOCLAST ULTRA 3.8X403 (UROLOGICAL SUPPLIES) ×1 IMPLANT
PROBE PNEUMATIC 1.0MMX570MM (UROLOGICAL SUPPLIES) ×2 IMPLANT
SET IRRIG Y TYPE TUR BLADDER L (SET/KITS/TRAYS/PACK) ×2 IMPLANT
SET WARMING FLUID IRRIGATION (MISCELLANEOUS) ×1 IMPLANT
SHEATH ACCESS URETERAL 24CM (SHEATH) ×1 IMPLANT
SPONGE GAUZE 4X4 12PLY (GAUZE/BANDAGES/DRESSINGS) ×1 IMPLANT
SPONGE LAP 4X18 X RAY DECT (DISPOSABLE) ×2 IMPLANT
STENT CONTOUR 6FRX26X.038 (STENTS) ×1 IMPLANT
STENT ENDOURETEROTOMY 7-14 26C (STENTS) IMPLANT
STONE CATCHER W/TUBE ADAPTER (UROLOGICAL SUPPLIES) ×2 IMPLANT
SUT SILK 2 0 30  PSL (SUTURE) ×1
SUT SILK 2 0 30 PSL (SUTURE) ×1 IMPLANT
SYR 20CC LL (SYRINGE) ×4 IMPLANT
SYRINGE 10CC LL (SYRINGE) ×1 IMPLANT
SYRINGE IRR TOOMEY STRL 70CC (SYRINGE) ×1 IMPLANT
TAPE CLOTH SURG 4X10 WHT LF (GAUZE/BANDAGES/DRESSINGS) ×1 IMPLANT
TOWEL OR NON WOVEN STRL DISP B (DISPOSABLE) ×2 IMPLANT
TRAY FOLEY CATH 14FRSI W/METER (CATHETERS) ×2 IMPLANT
TUBING CONNECTING 10 (TUBING) ×5 IMPLANT
WATER STERILE IRR 1500ML POUR (IV SOLUTION) ×1 IMPLANT

## 2011-11-12 NOTE — Procedures (Signed)
Right 38F nephroureteral catheter placed to bladder No complication No blood loss. See complete dictation in [facility].

## 2011-11-12 NOTE — Anesthesia Preprocedure Evaluation (Signed)
Anesthesia Evaluation  Patient identified by MRN, date of birth, ID band Patient awake    Reviewed: Allergy & Precautions, H&P , NPO status , Patient's Chart, lab work & pertinent test results  Airway Mallampati: II TM Distance: >3 FB Neck ROM: Full    Dental No notable dental hx. (+) Poor Dentition   Pulmonary neg pulmonary ROS,  breath sounds clear to auscultation  Pulmonary exam normal       Cardiovascular hypertension, Pt. on medications negative cardio ROS  Rhythm:Regular Rate:Normal     Neuro/Psych T5 paraplegic.  S/p bil BKA's negative neurological ROS  negative psych ROS   GI/Hepatic negative GI ROS, Neg liver ROS,   Endo/Other  negative endocrine ROSMorbid obesity  Renal/GU negative Renal ROS  negative genitourinary   Musculoskeletal negative musculoskeletal ROS (+)   Abdominal   Peds negative pediatric ROS (+)  Hematology negative hematology ROS (+)   Anesthesia Other Findings   Reproductive/Obstetrics negative OB ROS                           Anesthesia Physical  Anesthesia Plan  ASA: II  Anesthesia Plan: General   Post-op Pain Management:    Induction: Intravenous  Airway Management Planned: Oral ETT  Additional Equipment:   Intra-op Plan:   Post-operative Plan: Extubation in OR  Informed Consent: I have reviewed the patients History and Physical, chart, labs and discussed the procedure including the risks, benefits and alternatives for the proposed anesthesia with the patient or authorized representative who has indicated his/her understanding and acceptance.   Dental advisory given  Plan Discussed with: CRNA  Anesthesia Plan Comments:         Anesthesia Quick Evaluation

## 2011-11-12 NOTE — Transfer of Care (Signed)
Immediate Anesthesia Transfer of Care Note  Patient: [name]  Procedure(s) Performed: Procedure(s) (LRB): NEPHROLITHOTOMY PERCUTANEOUS (Right)  Patient Location: PACU  Anesthesia Type: General  Level of Consciousness: awake, alert , oriented and patient cooperative  Airway & Oxygen Therapy: Patient Spontanous Breathing and Patient connected to face mask oxygen  Post-op Assessment: Report given to PACU RN and Post -op Vital signs reviewed and stable  Post vital signs: Reviewed and stable  Complications: No apparent anesthesia complications
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
## 2011-11-12 NOTE — Preoperative (Signed)
Beta Blockers   Reason not to administer Beta Blockers:Not Applicable 
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
## 2011-11-12 NOTE — Anesthesia Postprocedure Evaluation (Signed)
  Anesthesia Post-op Note  Patient: [name]  Procedure(s) Performed: Procedure(s) (LRB): NEPHROLITHOTOMY PERCUTANEOUS (Right)  Patient Location: PACU  Anesthesia Type: General  Level of Consciousness: awake and alert   Airway and Oxygen Therapy: Patient Spontanous Breathing  Post-op Pain: mild  Post-op Assessment: Post-op Vital signs reviewed, Patient's Cardiovascular Status Stable, Respiratory Function Stable, Patent Airway and No signs of Nausea or vomiting  Post-op Vital Signs: stable  Complications: No apparent anesthesia complications

## 2011-11-12 NOTE — Procedures (Signed)
L arm PICC No complication No blood loss. See complete dictation in [facility] PACS.  

## 2011-11-12 NOTE — Brief Op Note (Signed)
11/12/2011  3:32 PM  PATIENT:  [name]  51 y.o. female  PRE-OPERATIVE DIAGNOSIS:  Right Nephrolithiasis  POST-OPERATIVE DIAGNOSIS:  Right Nephrolithiasis  PROCEDURE:  Procedure(s) (LRB): NEPHROLITHOTOMY PERCUTANEOUS (Right) >2cm Dilation of right nephrostomy tube tract Right antegrade pyelogram Right antegrade ureter stent placement  SURGEON:  Surgeon(s) and Role:    * Milford Cage, MD - Primary  PHYSICIAN ASSISTANT:   ASSISTANTS: none   ANESTHESIA:   general  EBL: 20cc  Total I/O In: 1000 [I.V.:1000] Out: 500 [Urine:500]  BLOOD ADMINISTERED:none  DRAINS: Urinary Catheter (Foley) and right nephroureteral catheter.   LOCAL MEDICATIONS USED:  NONE  SPECIMEN:  Source of Specimen:  right kidney stone.  DISPOSITION OF SPECIMEN:  AUS lab for analysis.  COUNTS:  YES  TOURNIQUET:  * No tourniquets in log *  DICTATION: .Other Dictation: Dictation Number [id number]  PLAN OF CARE: Admit for overnight observation  PATIENT DISPOSITION:  PACU - hemodynamically stable.   Delay start of Pharmacological VTE agent (>24hrs) due to surgical blood loss or risk of bleeding: yes

## 2011-11-12 NOTE — Progress Notes (Signed)
ANTIBIOTIC CONSULT NOTE - INITIAL  Pharmacy Consult for IV Gentamicin  Indication:  Post-op infection prophylaxis, synergy with Ampicillin   Allergies  Allergen Reactions  . Iohexol Rash    Patient Measurements: Height: 5\' 2"  (157.5 cm) Weight: 250 lb (113.399 kg) IBW/kg (Calculated) : 50.1  Adjusted Body Weight:: 65 kg  Vital Signs: Temp: 97.7 F (36.5 C) (05/22 1714) Temp src: Oral (05/22 1135) BP: 148/92 mmHg (05/22 1714) Pulse Rate: 94  (05/22 1714)  Labs:  Basename 11/12/11 1624 11/12/11 0845  WBC -- 5.9  HGB 11.9* 12.6  PLT -- 305  LABCREA -- --  CREATININE -- 0.90   Estimated Creatinine Clearance: 90 ml/min (by C-G formula based on Cr of 0.9).  Microbiology: Recent Results (from the past 720 hour(s))  SURGICAL PCR SCREEN     Status: Abnormal   Collection Time   11/12/11 11:34 AM      Component Value Range Status Comment   MRSA, PCR NEGATIVE  NEGATIVE  Final    Staphylococcus aureus POSITIVE (*) NEGATIVE  Final     Medical History: Past Medical History  Diagnosis Date  . Amputation, leg, bilateral, traumatic   . Paralysis   . Kidney stone   . Hypertension 09/2011    had in hospital  . Blood transfusion     several over yrs.    Medications:  Prescriptions prior to admission  Medication Sig Dispense Refill  . acetaminophen (TYLENOL) 500 MG tablet Take 500 mg by mouth every 6 (six) hours as needed. pain      . amLODipine (NORVASC) 10 MG tablet Take 1 tablet (10 mg total) by mouth daily.  30 tablet  2  . diphenhydrAMINE (SOMINEX) 25 MG tablet Take 25 mg by mouth at bedtime as needed. For sleep      . oxyCODONE (OXY IR/ROXICODONE) 5 MG immediate release tablet Take 5 mg by mouth every 4 (four) hours as needed. pain      . senna-docusate (SENOKOT-S) 8.6-50 MG per tablet Take 1 tablet by mouth 2 (two) times daily.  60 tablet  0   Assessment: Asked to calculate gentamicin dose for 24 hours' therapy following percutaneous nephrolithotomy.  Pre-op dose of  1.5 mg/kg was given at 1338 today.  Will order a single dose of 5 mg/kg using an adjusted weight taking into account amputations.  Goal of Therapy:  Prevention of infection  Plan:  Gentamicin 325 mg IV x 1 dose at 0200 11/13/11  [facility] R.Ph. 11/12/2011,7:10 PM

## 2011-11-13 ENCOUNTER — Encounter (HOSPITAL_COMMUNITY): Payer: Self-pay | Admitting: Urology

## 2011-11-13 MED ORDER — OXYCODONE HCL 5 MG PO TABS: 5.0000 mg | ORAL_TABLET | ORAL | Status: AC | PRN

## 2011-11-13 NOTE — Progress Notes (Signed)
Per MD order, PICC line removed. Cath intact at 47cm. Vaseline pressure gauze to site, pressure held x . No bleeding to site. Pt instructed to keep dressing CDI x 24 hours. Avoid heavy lifting, pushing or pulling x 24 hours,  If bleeding occurs hold pressure, if bleeding does not stop contact MD or go to the ED. Pt does not have any questions. [name]

## 2011-11-13 NOTE — Progress Notes (Signed)
Pt asleep. Did not obtained vitals d/t pt stated "if i am asleep do not wake me. i have been up all night."

## 2011-11-13 NOTE — Discharge Instructions (Signed)
DISCHARGE INSTRUCTIONS FOR PCNL   ACTIVITY 1. No strenuous activity, sexual activity, or lifting greater than 10 pounds for 3 weeks. 2. No driving while on narcotic pain medications 3. Drink plenty of water 4. Continue to walk at home - you can still get blood clots when you are at home, so keep active, but don't over do it. 5. May return to work in 1 week (but not heavy or strenuous activity).  BATHING 1. You can shower and we recommend daily showers.  Cover your wound with a dressing and remove the dressing immediately after the shower.  Do not submerge wound under water.  WOUND CARE Your wound will drain bloody fluid and may do so for 7-14 days.  1. You may use gauze and tape to dress your wound.  If you choose this method, then change the dressing as it becomes soaked.  Change it at least once daily until it stops draining.  SIGNS/SYMPTOMS TO CALL: 1. Please call us if you have a fever greater than 101.5, uncontrolled nausea/vomiting, uncontrolled pain, dizziness, unable to urinate, chest pain, shortness of breath, leg swelling, leg pain, redness around wound, drainage from wound, or any other concerns or questions. 2. You can reach [place] at [phone]. 3.

## 2011-11-13 NOTE — Discharge Summary (Signed)
Physician Discharge Summary  Patient ID: [name] MRN: [id number] DOB/AGE: 27-May-1961 30 y.o.  Admit date: 11/12/2011 Discharge date: 11/13/2011  Admission Diagnoses: Right nephrolithiasis  Discharge Diagnoses:  Right nephrolithiasis  Discharged Condition: good  Hospital Course:  The patient was admitted following right sided percutaneous nephrostolithotomy. She did well overnight and was able to control her pain with oral pain medications. She has a chronic indwelling catheter which was changed the time of surgery. Her nephroureteral stent was removed without any increase in pain. She was able to tolerate a regular diet. Initially her blood pressure was high but this came down overnight. She was warned again regarding the need to control her high blood pressure and the risks she takes by keeping a time. She was instructed to follow up with her PCP as we had discussed her last hospitalization. She wishes to schedule a left-sided percutaneous nephrostolithotomy for her left-sided stone burden.  Consults: None  Significant Diagnostic Studies: None  Treatments: surgery: Right percutaneous nephrostolithotomy  Discharge Exam: Blood pressure 111/64, pulse 62, temperature 97.5 F (36.4 C), temperature source Oral, resp. rate 20, height 5\' 2"  (1.575 m), weight 113.399 kg (250 lb), SpO2 98.00%. See PE from progress note on date of discharge.  Disposition: 01-Home or Self Care  Discharge Orders    Future Orders Please Complete By Expires   Discharge patient        Medication List  As of 11/13/2011  7:45 AM   TAKE these medications         acetaminophen 500 MG tablet   Commonly known as: TYLENOL   Take 500 mg by mouth every 6 (six) hours as needed. pain      amLODipine 10 MG tablet   Commonly known as: NORVASC   Take 1 tablet (10 mg total) by mouth daily.      diphenhydrAMINE 25 MG tablet   Commonly known as: SOMINEX   Take 25 mg by mouth at bedtime as needed. For sleep     oxyCODONE 5 MG immediate release tablet   Commonly known as: Oxy IR/ROXICODONE   Take 5 mg by mouth every 4 (four) hours as needed. pain      senna-docusate 8.6-50 MG per tablet   Commonly known as: Senokot-S   Take 1 tablet by mouth 2 (two) times daily.           Follow-up Information    Follow up with Saunders Revel, PA on 11/19/2011. (1:00 pm)    Contact information:   [address] [facility] [address] [facility] [place] [address] [phone]         Patient given Rx for oxycodone 5-10mg  PO Q4hr prn; #60 tabs.  Signed: Milford Cage 11/13/2011, 7:45 AM

## 2011-11-13 NOTE — Op Note (Signed)
NAMEFREDERICKA, BOTTCHER NO.:  [id number]  MEDICAL RECORD NO.:  [id number]  LOCATION:  1433                          FACILITY:  [facility]  PHYSICIAN:  Natalia Leatherwood, MD    DATE OF BIRTH:  Aug 28, 1960  DATE OF PROCEDURE:  11/12/2011 DATE OF DISCHARGE:                              OPERATIVE REPORT   SURGEON:  Natalia Leatherwood, M.D.  ASSISTANT:  None.  PREOPERATIVE DIAGNOSIS:  Right nephrolithiasis.  POSTOPERATIVE DIAGNOSIS:  Right nephrolithiasis.  PROCEDURES PERFORMED: 1. Right percutaneous nephrostolithotomy, greater than 2 cm in size. 2. Dilation of right percutaneous nephrostomy tract. 3. Right antegrade pyelogram with interpretation. 4. Right antegrade ureteral stent placement. 5. Right antegrade ureteroscopy with basket stone retrieval. 6. Fluoroscopy less than 1 hour with interpretation.  DRAINS:  Foley catheter which was exchanged today and a right nephroureteral catheter which was not to drainage.  SPECIMENS:  Stone removed and given to patient.  COMPLICATIONS:  None.  ESTIMATED BLOOD LOSS:  20 mL.  FINDINGS:  Large stone burden on the right side.  HISTORY OF PRESENT ILLNESS:  A 51 year old female who presented to the ER with a possible urinary tract infection with obstructing stones.  She had bilateral ureteral stents placed.  She has had percutaneous nephrostolithotomy before due to her large stone burden.  It appeared on her CT scan from April 2013 that she again had a large stone burden.  We discussed management options and she elected to have percutaneous nephrostolithotomy performed and we started with the right side.  She presented today for this procedure after having percutaneous access placed by interventional radiology today.  PROCEDURE:  After informed consent was obtained, the patient was taken to the operating room, where she was placed in a supine position on her gurney.  IV antibiotics were infused and general anesthesia was  induced. Her Foley catheter was then changed to an 18-French catheter in a sterile fashion with 10 mL sterile water in the balloon.  Following this, she was placed in a prone position, making sure to pad all pertinent neurovascular pressure points.  Gel pads were used as well as large gel rolls that were placed perpendicular on her body across her clavicles and across her hips.  We made sure that she had no pressure on her breasts and also that there was no pressure on her abdomen where her hernia sac is located for her large abdominal hernia on the right side. Her arms were positioned appropriately and she was secured to the table.   Her right back was then prepped and draped along with the nephrostomy tube in the usual sterile fashion.  Following this, a time-out was performed in which the correct patient, surgical site, and procedure were identified and agreed upon by the team.    After this, an Amplatz Super Stiff wire was placed down the nephroureteral catheter into the bladder with a good curl on fluoroscopy.  The nephroureteral stent was removed and then an 11 blade was used to make an incision in the skin and the fascia. Following this, a dual-lumen access sheath was placed over the Amplatz wire into the proximal ureter and a 2nd Super Stiff  Amplatz wire was placed down into the bladder with good curl on fluoroscopy into the bladder.  One wire was secured at the safety wire, another was used as a working wire.  The balloon dilator was then placed over the working wire with a tip in the renal pelvis.  This was dilated under fluoroscopy up to 14 atmospheres and held for 3 minutes.  After this was done, the access sheath was placed over the balloon while still inflated into the renal pelvis and then the balloon was deflated.  There was no return of blood.  Following this, the rigid nephroscope was placed into the access sheath and the stone was immediately visible.  The 3-prong  grasper was used to grasp out larger pieces of the stone.  Following this, lithotripsy was carried out with both pneumatic and ultrasonic power with the lithoclast.  All the stones that we were able to be visualized were removed.  Following this, a flexible cystoscope was placed to evaluate the remaining collecting system.  There appeared there could be an upper pole stone, however, I was unable to identify this and it appeared very small on the fluoroscopy.  Following this, a digital ureteroscope was used to perform an antegrade ureteroscopy on the right side, as it was felt that some the stone fragments were further into the ureter.  This was the case and these stones were removed with a 0-tip Nitinol basket.  Ureteroscopy was taken successfully all the way down into the bladder and there were no other stone fragments noted.  The ureteroscope was then pulled back into the renal pelvis and I injected contrast into the renal pelvis to obtain an antegrade pyelogram.  There was no extravasation from the collecting system noted.  Following this, a 6 x 26 double-J ureteral stent without strings was placed in antegrade fashion through a ureteral access sheath using fluoroscopy using a Seldinger technique over a working wire.  After this was done, a stent grasper was used to position the stent into the renal pelvis with a good curl.  The distal curl was noted to be in the bladder.  After that was completed, a 5- [place] nephroureteral catheter was placed over a safety wire into the distal right ureter just reaching into the bladder.  After this, the access sheath was removed without any significant bleeding.  I elected not to leave a nephrostomy tube in place, as she had an internal ureter stent in place without strings.  After this was done, the nephroureteral tube was tied so that it would not drain and it was secured to the skin with a silk stitch and then 3-0 chromic sutures were used to  close the patient's skin where the nephrostomy tube had been in place.  It was then dressed with a 4 x 4 and a Tegaderm and this completed the procedure.  She was placed back in a supine position and anesthesia was reversed.  She was taken to the PACU in stable condition.  She will be kept overnight for observation.          ______________________________ Natalia Leatherwood, MD     DW/MEDQ  D:  11/12/2011  T:  11/13/2011  Job:  [id number]

## 2011-11-13 NOTE — Progress Notes (Signed)
Urology Progress Note  Subjective:     No acute urologic events overnight. Pain well controlled. No nausea or emesis.   ROS: Negative: chest pain  Objective:  Patient Vitals for the past 24 hrs:  BP Temp Temp src Pulse Resp SpO2 Height Weight  11/12/11 2315 111/64 mmHg 97.5 F (36.4 C) Oral 62  20  98 % - -  11/12/11 1714 148/92 mmHg 97.7 F (36.5 C) - 94  - 99 % 5\' 2"  (1.575 m) 113.399 kg (250 lb)  11/12/11 1700 122/70 mmHg - - 78  22  100 % - -  11/12/11 1645 127/64 mmHg 97.4 F (36.3 C) - 70  15  98 % - -  11/12/11 1630 143/64 mmHg - - 76  15  97 % - -  11/12/11 1615 151/74 mmHg - - 71  19  100 % - -  11/12/11 1600 145/57 mmHg - - 69  14  100 % - -  11/12/11 1545 150/106 mmHg 97.4 F (36.3 C) - 75  18  100 % - -  11/12/11 1135 153/83 mmHg 96.8 F (36 C) Oral 95  18  98 % - -    Physical Exam: General:  No acute distress, awake Cardiovascular:    [x]   S1/S2 present, RRR  []   Irregularly irregular Chest:  CTA-B Abdomen:               []  Soft, appropriately TTP  [x]  Soft, NTTP  []  Soft, appropriately TTP, incision(s) clean/dry/intact  Genitourinary: Right flank without ecchymosis, nephroureteral stent in place. Foley:  Foley draining slight pink tinged fluid.    I/O last 3 completed shifts: In: 3480 [P.O.:360; I.V.:2870; IV Piggyback:250] Out: 2400 [Urine:2350; Blood:50]  Recent Labs  [facility] 11/12/11 1624 11/12/11 0845   HGB 11.9* 12.6   WBC -- 5.9   PLT -- 305    Recent Labs  [facility] 11/12/11 0845   NA 141   K 3.7   CL 107   CO2 25   BUN 16   CREATININE 0.90   CALCIUM 9.0   GFRNONAA 74*   GFRAA 86*     No results found for this basename: PT:2,INR:2,APTT:2 in the last 72 hours   No components found with this basename: ABG:2    Length of stay: 1 days.  Assessment: Right nephrolilthiasis POD#1 right PCNL  Plan: -Discontinued right nephroureteral tube. -Discharge home. -She states she has plenty of pain medications at home. -Discontinue  PICC line right before she goes home.   Natalia Leatherwood, MD [phone]

## 2011-11-14 ENCOUNTER — Other Ambulatory Visit: Payer: Self-pay | Admitting: Urology

## 2011-11-18 ENCOUNTER — Other Ambulatory Visit: Payer: Self-pay | Admitting: Urology

## 2011-11-18 DIAGNOSIS — N200 Calculus of kidney: Secondary | ICD-10-CM

## 2011-12-03 ENCOUNTER — Encounter (HOSPITAL_COMMUNITY): Payer: Self-pay | Admitting: *Deleted

## 2011-12-03 NOTE — Pre-Procedure Instructions (Signed)
Hx reviewed with patient Instructions given to arrive at Radiology at 7:15 am on 12/15/11 Npo after midnight except to take Norvasc with sip water  Shower of bath with Betasept pm and am

## 2011-12-11 ENCOUNTER — Encounter (HOSPITAL_COMMUNITY): Payer: Self-pay | Admitting: Pharmacy Technician

## 2011-12-12 ENCOUNTER — Other Ambulatory Visit: Payer: Self-pay | Admitting: Radiology

## 2011-12-14 MED ORDER — SODIUM CHLORIDE 0.9 % IV SOLN
2.0000 g | INTRAVENOUS | Status: AC
  Administered 2011-12-15: 2 g via INTRAVENOUS
  Filled 2011-12-14: qty 2000

## 2011-12-14 MED ORDER — GENTAMICIN SULFATE 40 MG/ML IJ SOLN
1.5000 mg/kg | INTRAVENOUS | Status: DC
  Filled 2011-12-14: qty 4.25

## 2011-12-15 ENCOUNTER — Inpatient Hospital Stay (HOSPITAL_COMMUNITY)
Admission: RE | Admit: 2011-12-15 | Discharge: 2011-12-22 | DRG: 660 | Disposition: A | Discharge status: Home Health | Payer: Managed Care, Other (non HMO) | Source: Ambulatory Visit | Attending: Urology | Admitting: Urology

## 2011-12-15 ENCOUNTER — Ambulatory Visit (HOSPITAL_COMMUNITY): Payer: Managed Care, Other (non HMO)

## 2011-12-15 ENCOUNTER — Encounter (HOSPITAL_COMMUNITY): Payer: Self-pay | Admitting: Anesthesiology

## 2011-12-15 ENCOUNTER — Ambulatory Visit (HOSPITAL_COMMUNITY)
Admission: RE | Disposition: A | Discharge status: Home Health | Payer: Managed Care, Other (non HMO) | Source: Ambulatory Visit | Attending: Urology

## 2011-12-15 ENCOUNTER — Encounter (HOSPITAL_COMMUNITY): Payer: Self-pay

## 2011-12-15 ENCOUNTER — Other Ambulatory Visit (HOSPITAL_COMMUNITY): Payer: Self-pay | Admitting: Interventional Radiology

## 2011-12-15 ENCOUNTER — Ambulatory Visit (HOSPITAL_COMMUNITY): Payer: Managed Care, Other (non HMO) | Admitting: Anesthesiology

## 2011-12-15 ENCOUNTER — Ambulatory Visit (HOSPITAL_COMMUNITY)
Admission: RE | Admit: 2011-12-15 | Discharge: 2011-12-15 | Disposition: A | Discharge status: Home / Self Care | Payer: Managed Care, Other (non HMO) | Source: Ambulatory Visit | Attending: Urology | Admitting: Urology

## 2011-12-15 DIAGNOSIS — N200 Calculus of kidney: Secondary | ICD-10-CM

## 2011-12-15 DIAGNOSIS — I739 Peripheral vascular disease, unspecified: Secondary | ICD-10-CM | POA: Diagnosis present

## 2011-12-15 DIAGNOSIS — T8140XA Infection following a procedure, unspecified, initial encounter: Secondary | ICD-10-CM | POA: Diagnosis not present

## 2011-12-15 DIAGNOSIS — L089 Local infection of the skin and subcutaneous tissue, unspecified: Secondary | ICD-10-CM

## 2011-12-15 DIAGNOSIS — B954 Other streptococcus as the cause of diseases classified elsewhere: Secondary | ICD-10-CM | POA: Diagnosis not present

## 2011-12-15 DIAGNOSIS — S88119A Complete traumatic amputation at level between knee and ankle, unspecified lower leg, initial encounter: Secondary | ICD-10-CM

## 2011-12-15 DIAGNOSIS — T148XXA Other injury of unspecified body region, initial encounter: Secondary | ICD-10-CM

## 2011-12-15 DIAGNOSIS — Y849 Medical procedure, unspecified as the cause of abnormal reaction of the patient, or of later complication, without mention of misadventure at the time of the procedure: Secondary | ICD-10-CM | POA: Diagnosis not present

## 2011-12-15 DIAGNOSIS — I10 Essential (primary) hypertension: Secondary | ICD-10-CM | POA: Diagnosis present

## 2011-12-15 DIAGNOSIS — Z6841 Body Mass Index (BMI) 40.0 and over, adult: Secondary | ICD-10-CM

## 2011-12-15 HISTORY — PX: NEPHROLITHOTOMY: SHX5134

## 2011-12-15 HISTORY — PX: CYSTOSCOPY W/ URETERAL STENT REMOVAL: SHX1430

## 2011-12-15 LAB — CBC
HCT: 37.1 % (ref 36.0–46.0)
Hemoglobin: 11.9 g/dL — ABNORMAL LOW (ref 12.0–15.0)
MCH: 26.2 pg (ref 26.0–34.0)
MCV: 81.5 fL (ref 78.0–100.0)
Platelets: 392 10*3/uL (ref 150–400)
RBC: 4.55 MIL/uL (ref 3.87–5.11)
WBC: 8.4 10*3/uL (ref 4.0–10.5)

## 2011-12-15 LAB — BASIC METABOLIC PANEL
CO2: 24 mEq/L (ref 19–32)
Calcium: 9.6 mg/dL (ref 8.4–10.5)
Chloride: 102 mEq/L (ref 96–112)
Glucose, Bld: 94 mg/dL (ref 70–99)
Potassium: 3.9 mEq/L (ref 3.5–5.1)
Sodium: 138 mEq/L (ref 135–145)

## 2011-12-15 LAB — APTT: aPTT: 34 seconds (ref 24–37)

## 2011-12-15 SURGERY — NEPHROLITHOTOMY PERCUTANEOUS
Anesthesia: General | Laterality: Left

## 2011-12-15 MED ORDER — MIDAZOLAM HCL 5 MG/5ML IJ SOLN
INTRAMUSCULAR | Status: AC | PRN
  Administered 2011-12-15 (×3): 2 mg via INTRAVENOUS

## 2011-12-15 MED ORDER — FENTANYL CITRATE 0.05 MG/ML IJ SOLN
INTRAMUSCULAR | Status: AC | PRN
  Administered 2011-12-15 (×2): 100 ug via INTRAVENOUS

## 2011-12-15 MED ORDER — LACTATED RINGERS IV SOLN
INTRAVENOUS | Status: DC
  Administered 2011-12-15: 1000 mL via INTRAVENOUS
  Administered 2011-12-15: 13:00:00 via INTRAVENOUS

## 2011-12-15 MED ORDER — SENNOSIDES-DOCUSATE SODIUM 8.6-50 MG PO TABS
1.0000 | ORAL_TABLET | Freq: Two times a day (BID) | ORAL | Status: DC
  Administered 2011-12-16 – 2011-12-21 (×10): 1 via ORAL
  Filled 2011-12-15 (×15): qty 1

## 2011-12-15 MED ORDER — ROCURONIUM BROMIDE 100 MG/10ML IV SOLN
INTRAVENOUS | Status: DC | PRN
  Administered 2011-12-15: 50 mg via INTRAVENOUS

## 2011-12-15 MED ORDER — CIPROFLOXACIN HCL 500 MG PO TABS: 500.0000 mg | ORAL_TABLET | Freq: Two times a day (BID) | ORAL | Status: DC

## 2011-12-15 MED ORDER — PHENYLEPHRINE HCL 10 MG/ML IJ SOLN
INTRAMUSCULAR | Status: DC | PRN
  Administered 2011-12-15: 80 ug via INTRAVENOUS
  Administered 2011-12-15: 40 ug via INTRAVENOUS

## 2011-12-15 MED ORDER — CEFAZOLIN SODIUM-DEXTROSE 2-3 GM-% IV SOLR
2.0000 g | Freq: Three times a day (TID) | INTRAVENOUS | Status: AC
  Administered 2011-12-15 – 2011-12-16 (×2): 2 g via INTRAVENOUS
  Filled 2011-12-15 (×2): qty 50

## 2011-12-15 MED ORDER — MIDAZOLAM HCL 5 MG/5ML IJ SOLN
INTRAMUSCULAR | Status: DC | PRN
  Administered 2011-12-15: 2 mg via INTRAVENOUS

## 2011-12-15 MED ORDER — MORPHINE SULFATE 2 MG/ML IJ SOLN
2.0000 mg | INTRAMUSCULAR | Status: DC | PRN
  Administered 2011-12-15: 2 mg via INTRAVENOUS
  Administered 2011-12-15: 4 mg via INTRAVENOUS
  Administered 2011-12-17 – 2011-12-20 (×8): 2 mg via INTRAVENOUS
  Administered 2011-12-21: 4 mg via INTRAVENOUS
  Administered 2011-12-21: 2 mg via INTRAVENOUS
  Filled 2011-12-15 (×4): qty 1
  Filled 2011-12-15: qty 2
  Filled 2011-12-15 (×5): qty 1
  Filled 2011-12-15: qty 2
  Filled 2011-12-15: qty 1

## 2011-12-15 MED ORDER — GENTAMICIN IN SALINE 1.6-0.9 MG/ML-% IV SOLN
INTRAVENOUS | Status: DC | PRN
  Administered 2011-12-15: 170 mg via INTRAVENOUS

## 2011-12-15 MED ORDER — DEXTROSE 5 % IV SOLN
1.5000 mg/kg | INTRAVENOUS | Status: DC
  Filled 2011-12-15: qty 4.25

## 2011-12-15 MED ORDER — IOHEXOL 300 MG/ML  SOLN
INTRAMUSCULAR | Status: AC
  Filled 2011-12-15: qty 2

## 2011-12-15 MED ORDER — GLYCOPYRROLATE 0.2 MG/ML IJ SOLN
INTRAMUSCULAR | Status: DC | PRN
  Administered 2011-12-15: .9 mg via INTRAVENOUS

## 2011-12-15 MED ORDER — ACETAMINOPHEN 325 MG PO TABS
650.0000 mg | ORAL_TABLET | ORAL | Status: DC | PRN
  Administered 2011-12-16: 650 mg via ORAL
  Filled 2011-12-15: qty 2

## 2011-12-15 MED ORDER — CIPROFLOXACIN IN D5W 400 MG/200ML IV SOLN
400.0000 mg | Freq: Once | INTRAVENOUS | Status: AC
  Administered 2011-12-15: 400 mg via INTRAVENOUS

## 2011-12-15 MED ORDER — LIDOCAINE HCL (CARDIAC) 20 MG/ML IV SOLN
INTRAVENOUS | Status: DC | PRN
  Administered 2011-12-15: 50 mg via INTRAVENOUS

## 2011-12-15 MED ORDER — ACETAMINOPHEN 10 MG/ML IV SOLN
1000.0000 mg | Freq: Four times a day (QID) | INTRAVENOUS | Status: AC
  Administered 2011-12-15 – 2011-12-16 (×3): 1000 mg via INTRAVENOUS
  Filled 2011-12-15 (×4): qty 100

## 2011-12-15 MED ORDER — LACTATED RINGERS IV SOLN: INTRAVENOUS | Status: DC

## 2011-12-15 MED ORDER — ONDANSETRON HCL 4 MG/2ML IJ SOLN
4.0000 mg | INTRAMUSCULAR | Status: DC | PRN
  Administered 2011-12-19: 4 mg via INTRAVENOUS
  Filled 2011-12-15: qty 2

## 2011-12-15 MED ORDER — PROMETHAZINE HCL 25 MG/ML IJ SOLN: 6.2500 mg | INTRAMUSCULAR | Status: DC | PRN

## 2011-12-15 MED ORDER — SODIUM CHLORIDE 0.9 % IV SOLN: Freq: Once | INTRAVENOUS | Status: DC

## 2011-12-15 MED ORDER — HYDROMORPHONE HCL PF 1 MG/ML IJ SOLN: 0.2500 mg | INTRAMUSCULAR | Status: DC | PRN

## 2011-12-15 MED ORDER — SODIUM CHLORIDE 0.9 % IR SOLN
Status: DC | PRN
  Administered 2011-12-15: 9000 mL

## 2011-12-15 MED ORDER — LIDOCAINE HCL 1 % IJ SOLN
INTRAMUSCULAR | Status: AC
  Filled 2011-12-15: qty 20

## 2011-12-15 MED ORDER — IOHEXOL 300 MG/ML  SOLN
INTRAMUSCULAR | Status: DC | PRN
  Administered 2011-12-15: 10 mL

## 2011-12-15 MED ORDER — IOHEXOL 300 MG/ML  SOLN
3.0000 mL | Freq: Once | INTRAMUSCULAR | Status: AC | PRN
  Administered 2011-12-15: 3 mL

## 2011-12-15 MED ORDER — ONDANSETRON HCL 4 MG/2ML IJ SOLN
INTRAMUSCULAR | Status: DC | PRN
  Administered 2011-12-15: 4 mg via INTRAVENOUS

## 2011-12-15 MED ORDER — OXYCODONE HCL 5 MG PO TABS: 5.0000 mg | ORAL_TABLET | ORAL | Status: AC | PRN

## 2011-12-15 MED ORDER — SODIUM CHLORIDE 0.9 % IV SOLN
INTRAVENOUS | Status: DC
  Administered 2011-12-15: 115 mL/h via INTRAVENOUS
  Administered 2011-12-16: 05:00:00 via INTRAVENOUS

## 2011-12-15 MED ORDER — FENTANYL CITRATE 0.05 MG/ML IJ SOLN
INTRAMUSCULAR | Status: DC | PRN
  Administered 2011-12-15 (×2): 50 ug via INTRAVENOUS
  Administered 2011-12-15: 100 ug via INTRAVENOUS
  Administered 2011-12-15: 50 ug via INTRAVENOUS

## 2011-12-15 MED ORDER — FENTANYL CITRATE 0.05 MG/ML IJ SOLN
INTRAMUSCULAR | Status: AC
  Filled 2011-12-15: qty 6

## 2011-12-15 MED ORDER — LORATADINE 10 MG PO TABS
10.0000 mg | ORAL_TABLET | Freq: Every day | ORAL | Status: DC
  Administered 2011-12-15 – 2011-12-17 (×2): 10 mg via ORAL
  Filled 2011-12-15 (×8): qty 1

## 2011-12-15 MED ORDER — OXYCODONE HCL 5 MG PO TABS
5.0000 mg | ORAL_TABLET | ORAL | Status: DC | PRN
Start: 2011-12-15 — End: 2011-12-22
  Administered 2011-12-16: 10 mg via ORAL
  Administered 2011-12-18: 5 mg via ORAL
  Filled 2011-12-15: qty 2
  Filled 2011-12-15: qty 1

## 2011-12-15 MED ORDER — SENNOSIDES-DOCUSATE SODIUM 8.6-50 MG PO TABS: 1.0000 | ORAL_TABLET | Freq: Two times a day (BID) | ORAL | Status: DC

## 2011-12-15 MED ORDER — GENTAMICIN SULFATE 40 MG/ML IJ SOLN
375.0000 mg | Freq: Once | INTRAVENOUS | Status: AC
  Administered 2011-12-15: 375 mg via INTRAVENOUS
  Filled 2011-12-15: qty 9.38

## 2011-12-15 MED ORDER — MIDAZOLAM HCL 2 MG/2ML IJ SOLN
INTRAMUSCULAR | Status: AC
  Filled 2011-12-15: qty 6

## 2011-12-15 MED ORDER — NEOSTIGMINE METHYLSULFATE 1 MG/ML IJ SOLN
INTRAMUSCULAR | Status: DC | PRN
  Administered 2011-12-15: 5 mg via INTRAVENOUS

## 2011-12-15 MED ORDER — AMLODIPINE BESYLATE 10 MG PO TABS
10.0000 mg | ORAL_TABLET | Freq: Every day | ORAL | Status: DC
  Administered 2011-12-15 – 2011-12-22 (×8): 10 mg via ORAL
  Filled 2011-12-15 (×8): qty 1

## 2011-12-15 MED ORDER — PROPOFOL 10 MG/ML IV BOLUS
INTRAVENOUS | Status: DC | PRN
  Administered 2011-12-15: 200 mg via INTRAVENOUS

## 2011-12-15 SURGICAL SUPPLY — 59 items
ADH SKN CLS APL DERMABOND .7 (GAUZE/BANDAGES/DRESSINGS) ×4
APL SKNCLS STERI-STRIP NONHPOA (GAUZE/BANDAGES/DRESSINGS)
BAG URINE DRAINAGE (UROLOGICAL SUPPLIES) ×4 IMPLANT
BANDAGE GAUZE ELAST BULKY 4 IN (GAUZE/BANDAGES/DRESSINGS) ×3 IMPLANT
BASKET STONE NITINOL 3FRX115MB (UROLOGICAL SUPPLIES) IMPLANT
BASKET ZERO TIP NITINOL 2.4FR (BASKET) ×2 IMPLANT
BENZOIN TINCTURE PRP APPL 2/3 (GAUZE/BANDAGES/DRESSINGS) ×4 IMPLANT
BSKT STON RTRVL ZERO TP 2.4FR (BASKET) ×4
CATCHER STONE W/TUBE ADAPTER (UROLOGICAL SUPPLIES) ×2 IMPLANT
CATH FOLEY 2W COUNCIL 20FR 5CC (CATHETERS) IMPLANT
CATH FOLEY 2WAY SLVR  5CC 18FR (CATHETERS) ×1
CATH FOLEY 2WAY SLVR 5CC 18FR (CATHETERS) ×2 IMPLANT
CATH ROBINSON RED A/P 20FR (CATHETERS) IMPLANT
CATH URET 5FR 28IN OPEN ENDED (CATHETERS) ×3 IMPLANT
CATH URET DUAL LUMEN 6-10FR 50 (CATHETERS) ×3 IMPLANT
CATH X-FORCE N30 NEPHROSTOMY (TUBING) ×3 IMPLANT
CHLORAPREP W/TINT 26ML (MISCELLANEOUS) ×3 IMPLANT
CLOTH BEACON ORANGE TIMEOUT ST (SAFETY) ×3 IMPLANT
COVER SURGICAL LIGHT HANDLE (MISCELLANEOUS) ×3 IMPLANT
DERMABOND ADVANCED (GAUZE/BANDAGES/DRESSINGS) ×2
DERMABOND ADVANCED .7 DNX12 (GAUZE/BANDAGES/DRESSINGS) IMPLANT
DRAPE C-ARM 42X72 X-RAY (DRAPES) ×3 IMPLANT
DRAPE CAMERA CLOSED 9X96 (DRAPES) ×3 IMPLANT
DRAPE LINGEMAN PERC (DRAPES) ×3 IMPLANT
DRAPE SURG IRRIG POUCH 19X23 (DRAPES) ×3 IMPLANT
DRAPE UTILITY XL STRL (DRAPES) ×2 IMPLANT
GLOVE BIOGEL PI IND STRL 7.5 (GLOVE) ×2 IMPLANT
GLOVE BIOGEL PI INDICATOR 7.5 (GLOVE) ×1
GLOVE ECLIPSE 7.0 STRL STRAW (GLOVE) ×4 IMPLANT
GOWN PREVENTION PLUS XLARGE (GOWN DISPOSABLE) ×2 IMPLANT
GUIDEWIRE STR DUAL SENSOR (WIRE) ×3 IMPLANT
KIT BASIN OR (CUSTOM PROCEDURE TRAY) ×3 IMPLANT
LASER FIBER DISP (UROLOGICAL SUPPLIES) ×1 IMPLANT
LASER FIBER DISP 1000U (UROLOGICAL SUPPLIES) IMPLANT
MANIFOLD NEPTUNE II (INSTRUMENTS) ×3 IMPLANT
NS IRRIG 1000ML POUR BTL (IV SOLUTION) ×3 IMPLANT
PACK BASIC VI WITH GOWN DISP (CUSTOM PROCEDURE TRAY) ×3 IMPLANT
PROBE EHL 9 FR 470CM (MISCELLANEOUS) IMPLANT
PROBE LITHOCLAST ULTRA 3.8X403 (UROLOGICAL SUPPLIES) ×1 IMPLANT
PROBE PNEUMATIC 1.0MMX570MM (UROLOGICAL SUPPLIES) ×3 IMPLANT
SET IRRIG Y TYPE TUR BLADDER L (SET/KITS/TRAYS/PACK) ×3 IMPLANT
SET WARMING FLUID IRRIGATION (MISCELLANEOUS) ×2 IMPLANT
SHEATH URET ACCESS 12FR/35CM (UROLOGICAL SUPPLIES) ×1 IMPLANT
SPONGE GAUZE 4X4 12PLY (GAUZE/BANDAGES/DRESSINGS) ×2 IMPLANT
SPONGE LAP 4X18 X RAY DECT (DISPOSABLE) ×3 IMPLANT
STENT CONTOUR 6FRX28X.038 (STENTS) ×1 IMPLANT
STENT CONTOUR URETERAL (STENTS) ×1 IMPLANT
STENT ENDOURETEROTOMY 7-14 26C (STENTS) IMPLANT
STONE CATCHER W/TUBE ADAPTER (UROLOGICAL SUPPLIES) ×3 IMPLANT
SUT MNCRL AB 4-0 PS2 18 (SUTURE) ×1 IMPLANT
SUT SILK 2 0 30  PSL (SUTURE) ×1
SUT SILK 2 0 30 PSL (SUTURE) ×2 IMPLANT
SYR 20CC LL (SYRINGE) ×6 IMPLANT
SYRINGE 10CC LL (SYRINGE) ×3 IMPLANT
SYRINGE IRR TOOMEY STRL 70CC (SYRINGE) ×3 IMPLANT
TOWEL OR NON WOVEN STRL DISP B (DISPOSABLE) ×3 IMPLANT
TRAY FOLEY CATH 14FRSI W/METER (CATHETERS) ×2 IMPLANT
TUBING CONNECTING 10 (TUBING) ×7 IMPLANT
WATER STERILE IRR 1500ML POUR (IV SOLUTION) ×2 IMPLANT

## 2011-12-15 NOTE — Brief Op Note (Signed)
12/15/2011  2:43 PM  PATIENT:  [name]  51 y.o. female  PRE-OPERATIVE DIAGNOSIS:  LEFT NEPHROLITHIASIS  POST-OPERATIVE DIAGNOSIS:  LEFT NEPHROLITHIASIS  PROCEDURE:  Procedure(s) (LRB): NEPHROLITHOTOMY PERCUTANEOUS (Left) CYSTOSCOPY WITH STENT REMOVAL (Bilateral) HOLMIUM LASER APPLICATION (Left) URETERAL CATHETER OR STENT PLACEMENT (Left)  SURGEON:  Surgeon(s) and Role:    * Milford Cage, MD - Primary  PHYSICIAN ASSISTANT:   ASSISTANTS: none   ANESTHESIA:   general  EBL:  Total I/O In: 1000 [I.V.:1000] Out: 250 [Urine:250]  BLOOD ADMINISTERED:none  DRAINS: Urinary Catheter (Foley)   LOCAL MEDICATIONS USED:  NONE  SPECIMEN:  Source of Specimen:  left kidney stone  DISPOSITION OF SPECIMEN:  AUS labe for chemical analysis.  COUNTS:  YES  TOURNIQUET:  * No tourniquets in log *  DICTATION: .Other Dictation: Dictation Number [id number]  PLAN OF CARE: Admit for overnight observation  PATIENT DISPOSITION:  PACU - hemodynamically stable.   Delay start of Pharmacological VTE agent (>24hrs) due to surgical blood loss or risk of bleeding: yes

## 2011-12-15 NOTE — Progress Notes (Signed)
GU Post-op check  No pain. No complaints. Patient has refused H&H.   Filed Vitals:   12/15/11 1606  BP: 170/88  Pulse: 71  Temp: 97.4 F (36.3 C)  Resp: 16   Gen: NAD CV: RRR Abd: soft, NTTP GU: foley with pink urine  A/P: Left PCNL today. -Observe overnight -Evaluate for possible discharge home in the morning.
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
## 2011-12-15 NOTE — Progress Notes (Signed)
Patient refused bloodworks. 

## 2011-12-15 NOTE — Anesthesia Postprocedure Evaluation (Signed)
  Anesthesia Post-op Note  Patient: [name]  Procedure(s) Performed: Procedure(s) (LRB): NEPHROLITHOTOMY PERCUTANEOUS (Left) CYSTOSCOPY WITH STENT REMOVAL (Bilateral) HOLMIUM LASER APPLICATION (Left) URETERAL CATHETER OR STENT PLACEMENT (Left)  Patient Location: PACU  Anesthesia Type: General  Level of Consciousness: awake and alert   Airway and Oxygen Therapy: Patient Spontanous Breathing  Post-op Pain: mild  Post-op Assessment: Post-op Vital signs reviewed, Patient's Cardiovascular Status Stable, Respiratory Function Stable, Patent Airway and No signs of Nausea or vomiting  Post-op Vital Signs: stable  Complications: No apparent anesthesia complications
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
## 2011-12-15 NOTE — Procedures (Signed)
Left perc nephroureteral 59F catheters x2 for OR stone removal No complication No blood loss. See complete dictation in [facility].
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
## 2011-12-15 NOTE — Transfer of Care (Signed)
Immediate Anesthesia Transfer of Care Note  Patient: [name]  Procedure(s) Performed: Procedure(s) (LRB): NEPHROLITHOTOMY PERCUTANEOUS (Left) CYSTOSCOPY WITH STENT REMOVAL (Bilateral) HOLMIUM LASER APPLICATION (Left) URETERAL CATHETER OR STENT PLACEMENT (Left)  Patient Location: PACU  Anesthesia Type: General  Level of Consciousness: sedated, patient cooperative and responds to stimulaton  Airway & Oxygen Therapy: Patient Spontanous Breathing and Patient connected to face mask oxgen  Post-op Assessment: Report given to PACU RN and Post -op Vital signs reviewed and stable  Post vital signs: Reviewed and stable  Complications: No apparent anesthesia complications

## 2011-12-16 ENCOUNTER — Ambulatory Visit (HOSPITAL_COMMUNITY): Payer: Managed Care, Other (non HMO)

## 2011-12-16 ENCOUNTER — Encounter (HOSPITAL_COMMUNITY): Payer: Self-pay | Admitting: Urology

## 2011-12-16 DIAGNOSIS — N200 Calculus of kidney: Secondary | ICD-10-CM | POA: Diagnosis present

## 2011-12-16 MED ORDER — DIPHENHYDRAMINE HCL 50 MG PO CAPS
50.0000 mg | ORAL_CAPSULE | Freq: Two times a day (BID) | ORAL | Status: AC
  Administered 2011-12-17 (×2): 50 mg via ORAL
  Filled 2011-12-16 (×2): qty 1

## 2011-12-16 MED ORDER — METHYLPREDNISOLONE 32 MG PO TABS
32.0000 mg | ORAL_TABLET | Freq: Two times a day (BID) | ORAL | Status: AC
  Administered 2011-12-17 (×2): 32 mg via ORAL
  Filled 2011-12-16 (×2): qty 1

## 2011-12-16 MED ORDER — CEFAZOLIN SODIUM-DEXTROSE 2-3 GM-% IV SOLR
2.0000 g | Freq: Three times a day (TID) | INTRAVENOUS | Status: DC
  Administered 2011-12-17 – 2011-12-18 (×2): 2 g via INTRAVENOUS
  Filled 2011-12-16 (×6): qty 50

## 2011-12-16 MED ORDER — POLYVINYL ALCOHOL 1.4 % OP SOLN
1.0000 [drp] | OPHTHALMIC | Status: DC | PRN
  Administered 2011-12-16: 1 [drp] via OPHTHALMIC
  Filled 2011-12-16: qty 15

## 2011-12-16 MED ORDER — MENTHOL 3 MG MT LOZG
1.0000 | LOZENGE | OROMUCOSAL | Status: DC | PRN
  Administered 2011-12-16: 3 mg via ORAL
  Filled 2011-12-16: qty 9

## 2011-12-16 MED ORDER — GENTAMICIN IN SALINE 1.6-0.9 MG/ML-% IV SOLN
80.0000 mg | Freq: Three times a day (TID) | INTRAVENOUS | Status: DC
  Administered 2011-12-17 – 2011-12-18 (×2): 80 mg via INTRAVENOUS
  Filled 2011-12-16 (×6): qty 50

## 2011-12-16 MED ORDER — SODIUM CHLORIDE 0.9 % IV SOLN
INTRAVENOUS | Status: DC
  Administered 2011-12-17 – 2011-12-21 (×10): via INTRAVENOUS

## 2011-12-16 NOTE — Op Note (Signed)
[name], [name] NO.:  [id number]  MEDICAL RECORD NO.:  [id number]  LOCATION:  1433                         FACILITY:  [facility]  PHYSICIAN:  Natalia Leatherwood, MD    DATE OF BIRTH:  Mar 30, 1961  DATE OF PROCEDURE:  12/15/2011 DATE OF DISCHARGE:                              OPERATIVE REPORT   SURGEON:  Natalia Leatherwood, MD  ASSISTANT:  None.  PREOPERATIVE DIAGNOSIS:  Left nephrolithiasis and right indwelling ureteral stent.  POSTOPERATIVE DIAGNOSIS:  Left nephrolithiasis.  PROCEDURES PERFORMED: 1. Left percutaneous nephrostolithotomy with stones greater than 2 cm. 2. Cystoscopy. 3. Right ureteral stent removal. 4. Left ureteral stent removal. 5. Dilation of left nephrostomy tube tract. 6. Laser lithotripsy. 7. Left antegrade ureteral stent placement. 8. Fluoroscopy less than 1 hour with interpretation.  COMPLICATIONS:  None.  SPECIMEN:  Stones sent to [facility] for stone analysis.  Her last stones were misplaced in the hospital and were unable to be sent for analysis.  ESTIMATED BLOOD LOSS:  Less than 20 mL.  DRAINS:  Foley catheter.  HISTORY OF PRESENT ILLNESS:  This is a 51 year old female who had a significant bilateral stone burden.  She had a right percutaneous nephrostolithotomy performed in May 2013.  She presents today for left percutaneous nephrostolithotomy due to the stone burden much greater than 2 cm in size.  She had a nephrostomy tubes placed by Interventional Radiology today with lower pole access and upper pole access.  The patient wished to proceed with the procedure.  PROCEDURE:  After informed consent was obtained, the patient was taken to the operating room.  She was placed in a supine position on the gurney.  IV antibiotics were infused, general anesthesia was induced, and PAS hose were placed on her legs.  Following this, her genitals were prepped and draped in usual sterile fashion.  Time-out was performed in which  the correct patient, surgical site, and procedure were identified and agreed upon by the team.  Flexible cystoscope was advanced through the urethra into the bladder.  The right ureteral stent was grasped and removed with a stent grasper and then, the scope was replaced in the bladder and the left ureteral stent was grasped and removed with stent grasper.  Following this, she was placed in a prone position on the operating room table, using large gel rolls to support her thorax. These were placed perpendicular to her thorax across the area below her clavicles and on her anterior superior iliac spine making sure there was no pressure on her hernia site or breast.  A Foley catheter was placed using sterile technique after the cystoscopy was completed.  This was an 18-French catheter with 10 mL of sterile water placed in the balloon. Her arms were positioned so that there were no neurovascular pressure points that had pressure.  Following this, she was secured to the table with tape.  After this was completed, her left flank and back were prepped and draped in usual sterile fashion along with the nephrostomy tubes.  A second time out was preformed in which the correct patient, surgical site and procedure were identified and agreed upon by the team. Next, the  upper pole nephrostomy tube was sewn into place to ensure would not be dislodged during the procedure.  Then, an Amplatz wire was placed down the middle to lower pole nephrostomy access site into the bladder with good curl noted on fluoroscopy.  This was removed and then, an 11-blade scalpel was used to make an incision in the skin and in the fascia.  After this, a dual-lumen access sheath was placed into the renal pelvis on fluoroscopy over the wire and a second wire was placed.  An Amplatz wire was unable to be placed past the stones in her renal pelvis.  Therefore, a sensor tip wire was placed down to the bladder with good curl noted on  fluoroscopy.  The Sensor wire was then secured as a safety wire and then the Amplatz wire was used as a working wire.  The nephrostomy dilating balloon was then placed with the tip in the renal pelvis and it was inflated under fluoroscopy to 12 atmospheres and held for 3 minutes.  After this was done, nephrostomy sheath was placed over the balloon dilator under fluoroscopy into the renal pelvis, and then the balloon was deflated.  There was no return of blood.  Then, rigid nephroscope was advanced into the kidney and there was immediate stone visible.  The 3-prong grasper was used to remove the larger stones and then the Ultratome was used to perform lithotripsy to grind up the stones that were too large to fit through the sheath.  After all the stone burden had been removed that could be removed with the rigid cystoscope.  A flexible cystoscope was used to grasp the stones in the upper pole with a ZeroTip Nitinol basket.  These were removed.  Finally, the rest of the kidney was explored with flexible cystoscope and there was noted to be one large stone and a lateral calix.  This was not able to be reach with the flexible cystoscope and therefore, the digital flexible ureteroscope was placed and the stone was too large to be removed from this calix.  Therefore, a 200-micron holmium laser filament was placed through the ureteroscope and lithotripsy was carried out at 0.5 joules and 20 Hz.  Pieces of the stone were removed, but because of the position in the lateral calix and having to flex 180 degrees to visualize this, I was unable to remove all the fragments.  It was felt that a ureteroscopy at an interval would have better success to removing the stone fragments.  Therefore, attention was turned to the ureteropelvic junction.  All stone fragments were removed that were at the junction and then the upper pole access was removed without having been used.  Next, a 6 x 28 double-J ureteral  stent was placed in an antegrade fashion into the bladder over the working wire with a good curl noted in the bladder on fluoroscopy and a good curl in the renal pelvis on direct visualization.  The access sheath was then removed leaving the safety wire in place.  There was no return of bleeding and therefore, the safety wire was removed, and a 4-0 Monocryl suture was used to close the nephrostomy tube site.  Dermabond was placed over this and the upper pole access site as well.  This completed the procedure. The patient was placed back in a supine position.  Anesthesia was reversed.  She was taken to the PACU in stable condition.  She will be kept overnight for observation.  ______________________________ Natalia Leatherwood, MD     DW/MEDQ  D:  12/15/2011  T:  12/15/2011  Job:  [id number]

## 2011-12-16 NOTE — Progress Notes (Signed)
GU  Nurse reports purulent drainage from previous right PCNL site when moving patient to prepare for discharge. Culture swab obtained by nursing staff. I evaluated the area and prepared it with betadine. I opened the area with a sterile kelley clamp with return of serosanguinous fluid. This was again swabbed and sent for culture. Wound was evaluated with cotton tip applicator and found to be approximately 4-5 cm deep. It was irrigated with sterile NS and packed with moist kerlex gauze and covered with ABD.  I recommend the patient regain vascular access for IV antibiotics and obtain a CT of the abdomen with IV & delayed contrast to look for perinephric abscess/urine leak.  Filed Vitals:   12/16/11 0518  BP: 122/72  Pulse: 78  Temp: 98.1 F (36.7 C)  Resp: 18   Gen: NAD Abd: Soft Back: Right PCNL site without erythema, wound is approximately 1.5 cm with clean edges, no crepitus.  A/P: Draining right flank surgical site. -Premedicate for IV contrast with benadryl & methylprednisolone. -Place PICC line -Restart ancef & gentamicin -Obtain wound culture. -Hold on CT until vascular access obtained. -NPO at midnight. -Change packing BID

## 2011-12-16 NOTE — Progress Notes (Signed)
Received a call from IV therapy that Radiology would place picc in the morning. Pt IV antibiotics will be placed on hold due to no IV access. Roosvelt Maser, RN

## 2011-12-16 NOTE — Progress Notes (Signed)
ANTIBIOTIC CONSULT NOTE - INITIAL  Pharmacy Consult for Gentamicin Indication: gram positive Synergy   Allergies  Allergen Reactions  . Iohexol Rash    Patient Measurements: Height: 5\' 2"  (157.5 cm) Weight: 249 lb 12.5 oz (113.3 kg) IBW/kg (Calculated) : 50.1    Vital Signs:   Intake/Output from previous day: 06/24 0701 - 06/25 0700 In: 1820 [P.O.:120; I.V.:1700] Out: 450 [Urine:450] Intake/Output from this shift: Total I/O In: 240 [P.O.:240] Out: 1100 [Urine:1100]  Labs:  Basename 12/15/11 0830  WBC 8.4  HGB 11.9*  PLT 392  LABCREA --  CREATININE 0.96   Estimated Creatinine Clearance: 84.4 ml/min (by C-G formula based on Cr of 0.96). No results found for this basename: VANCOTROUGH:2,VANCOPEAK:2,VANCORANDOM:2,GENTTROUGH:2,GENTPEAK:2,GENTRANDOM:2,TOBRATROUGH:2,TOBRAPEAK:2,TOBRARND:2,AMIKACINPEAK:2,AMIKACINTROU:2,AMIKACIN:2, in the last 72 hours   Microbiology: No results found for this or any previous visit (from the past 720 hour(s)).  Medical History: Past Medical History  Diagnosis Date  . Amputation, leg, bilateral, traumatic   . Paralysis   . Kidney stone   . Hypertension 09/2011    had in hospital  . Blood transfusion     several over yrs.    Medications:  Scheduled:    . acetaminophen  1,000 mg Intravenous Q6H  . amLODipine  10 mg Oral Daily  .  ceFAZolin (ANCEF) IV  2 g Intravenous Q8H  .  ceFAZolin (ANCEF) IV  2 g Intravenous Q8H  . diphenhydrAMINE  50 mg Oral UD  . gentamicin  375 mg Intravenous Once  . gentamicin  80 mg Intravenous Q8H  . loratadine  10 mg Oral Daily  . methylPREDNISolone  32 mg Oral UD  . senna-docusate  1 tablet Oral BID   Infusions:    . sodium chloride    . DISCONTD: sodium chloride 115 mL/hr at 12/16/11 0506   PRN: acetaminophen, menthol-cetylpyridinium, morphine injection, ondansetron, oxyCODONE, polyvinyl alcohol Assessment:  51 yo F with purulent drainage from previous right PCNL site to restart IV abx with  ancef and gentamicin for gram + synergy  Will f/u CT of abdomen for possible perinephric abscess/urine leak.   Goal of Therapy:  Gentamicin Peak 3-4 Gentamicin trough  < 1  Plan:  1.) Start gentamicin 1mg /kg/dose q8h ( 80 mg q8h) 2.) F/U CT and cultures 3.) Check gentamicin peaks/troughs as needed  Jannet Calip, Loma Messing PharmD 5:35 PM 12/16/2011

## 2011-12-16 NOTE — Progress Notes (Signed)
Urology Progress Note  Subjective:     No acute urologic events overnight. Slept poorly. No pain this morning. Tolerating regular diet.   ROS: Negative: SOB  Objective:  Patient Vitals for the past 24 hrs:  BP Temp Temp src Pulse Resp SpO2 Height Weight  12/16/11 0518 122/72 mmHg 98.1 F (36.7 C) Oral 78  18  100 % - -  12/15/11 1629 - - - - - - 5\' 2"  (1.575 m) 113.3 kg (249 lb 12.5 oz)  12/15/11 1606 170/88 mmHg 97.4 F (36.3 C) Oral 71  16  98 % 5\' 2"  (1.575 m) 113.3 kg (249 lb 12.5 oz)  12/15/11 1545 163/72 mmHg 97.6 F (36.4 C) - 63  15  100 % - -  12/15/11 1530 185/72 mmHg - - 67  13  100 % - -  12/15/11 1515 165/87 mmHg - - 79  17  100 % - -  12/15/11 1510 - - - 76  18  100 % - -  12/15/11 1500 156/95 mmHg - - 85  18  100 % - -  12/15/11 1450 157/94 mmHg 97.6 F (36.4 C) - 84  10  100 % - -    Physical Exam: General:  No acute distress, awake Cardiovascular:    [x]   S1/S2 present, RRR  []   Irregularly irregular Chest:  CTA-B Abdomen:               []  Soft, appropriately TTP  [x]  Soft, NTTP  []  Soft, appropriately TTP, incision(s) clean/dry/intact  Genitourinary: Left flank without ecchymosis or TTP. Surgical site clean/dry/intact. Foley:  Draining pink urine.    I/O last 3 completed shifts: In: 1820 [P.O.:120; I.V.:1700] Out: 450 [Urine:450]  Recent Labs  Basename 12/15/11 0830   HGB 11.9*   WBC 8.4   PLT 392    Recent Labs  Basename 12/15/11 0830   NA 138   K 3.9   CL 102   CO2 24   BUN 17   CREATININE 0.96   CALCIUM 9.6   GFRNONAA 68*   GFRAA 79*     Recent Labs  Basename 12/15/11 0830   INR 1.03   APTT 34     No components found with this basename: ABG:2    Length of stay: 1 days.  Assessment: Left nephrolithiasis POD#1 left PCNL  Plan: -Saline lock IV -Discharge home.   Natalia Leatherwood, MD [phone]

## 2011-12-16 NOTE — Plan of Care (Signed)
Problem: Phase I Progression Outcomes Goal: OOB as tolerated unless otherwise ordered Outcome: Not Applicable Date Met:  12/16/11 Patient BIL BKA Goal: Sutures/staples intact Outcome: Completed/Met Date Met:  12/16/11 Dermabond present intact  Problem: Phase II Progression Outcomes Goal: Sutures/staples intact Outcome: Completed/Met Date Met:  12/16/11 dremabond present, intact Goal: Foley discontinued Outcome: Not Applicable Date Met:  12/16/11 Chronic FC  Problem: Phase III Progression Outcomes Goal: Voiding independently Outcome: Completed/Met Date Met:  12/16/11 Chronic FC

## 2011-12-17 ENCOUNTER — Ambulatory Visit (HOSPITAL_COMMUNITY): Payer: Managed Care, Other (non HMO)

## 2011-12-17 MED ORDER — SODIUM CHLORIDE 0.9 % IV BOLUS (SEPSIS)
500.0000 mL | Freq: Once | INTRAVENOUS | Status: AC
  Administered 2011-12-17: 500 mL via INTRAVENOUS

## 2011-12-17 MED ORDER — IOHEXOL 300 MG/ML  SOLN
80.0000 mL | Freq: Once | INTRAMUSCULAR | Status: AC | PRN
  Administered 2011-12-17: 80 mL via INTRAVENOUS

## 2011-12-17 NOTE — Progress Notes (Signed)
Urology Progress Note  Subjective:     No acute urologic events overnight. No fever. No pain this morning. NPO.   ROS: Negative: SOB  Objective:  Patient Vitals for the past 24 hrs:  BP Temp Temp src Pulse Resp SpO2  12/17/11 0613 116/72 mmHg 98.6 F (37 C) Oral 76  18  96 %  12/16/11 2203 - 99.4 F (37.4 C) Oral - - -  12/16/11 2035 105/70 mmHg 101.4 F (38.6 C) Oral 96  18  93 %    Physical Exam: General:  No acute distress, awake Cardiovascular:    [x]   S1/S2 present, RRR  []   Irregularly irregular Chest:  CTA-B Abdomen:               []  Soft, appropriately TTP  [x]  Soft, NTTP  []  Soft, appropriately TTP, incision(s) clean/dry/intact  Genitourinary: Left flank without ecchymosis or TTP. Surgical site clean/dry/intact. Foley:  Draining pink urine.    I/O last 3 completed shifts: In: 2300 [P.O.:600; I.V.:1700] Out: 1900 [Urine:1900]  Recent Labs  Basename 12/15/11 0830   HGB 11.9*   WBC 8.4   PLT 392    Recent Labs  Basename 12/15/11 0830   NA 138   K 3.9   CL 102   CO2 24   BUN 17   CREATININE 0.96   CALCIUM 9.6   GFRNONAA 68*   GFRAA 79*     Recent Labs  Basename 12/15/11 0830   INR 1.03   APTT 34     No components found with this basename: ABG:2    Length of stay: 2 days.  Assessment: Left nephrolithiasis POD#2 left PCNL. Right percutaneous site drainage. Plan: -Awaiting PICC line placement. -Will need premedication before she can receive CT scan. -Await culture swab results. -Continue antibiotics.   Natalia Leatherwood, MD [phone]

## 2011-12-17 NOTE — Procedures (Signed)
Successful Image guided dual lumen PICC placement to (L)brachial vein. Length 50cm, tip at SVC/RA. Ready for use.  Brayton El PA-C 12/17/2011 4:15 PM

## 2011-12-17 NOTE — Progress Notes (Signed)
Talked to patient at length about DCP; patient is wheelchair bound, lives with spouse; PCP is Dr Quintella Reichert but she does not see him often; Case Manager stressed the importance of seeing her PCP and a yearly check up; Prescriptions are filled at [facility] in [facility]; Options for home health care given to the patient and she chose [facility] for home health RN for wound care; B Ave Filter RN, BSN, [place].

## 2011-12-18 LAB — BASIC METABOLIC PANEL
BUN: 14 mg/dL (ref 6–23)
CO2: 23 mEq/L (ref 19–32)
Chloride: 105 mEq/L (ref 96–112)
GFR calc Af Amer: >90 mL/min (ref >90)
Potassium: 4.1 mEq/L (ref 3.5–5.1)

## 2011-12-18 LAB — CBC
HCT: 35.4 % — ABNORMAL LOW (ref 36.0–46.0)
Hemoglobin: 11 g/dL — ABNORMAL LOW (ref 12.0–15.0)
RBC: 4.29 MIL/uL (ref 3.87–5.11)
RDW: 16.1 % — ABNORMAL HIGH (ref 11.5–15.5)
WBC: 6.5 10*3/uL (ref 4.0–10.5)

## 2011-12-18 MED ORDER — DOXYCYCLINE HYCLATE 100 MG PO TABS
100.0000 mg | ORAL_TABLET | Freq: Two times a day (BID) | ORAL | Status: DC
  Administered 2011-12-18 – 2011-12-21 (×7): 100 mg via ORAL
  Filled 2011-12-18 (×9): qty 1

## 2011-12-18 MED ORDER — SODIUM CHLORIDE 0.9 % IJ SOLN
10.0000 mL | INTRAMUSCULAR | Status: AC | PRN
  Administered 2011-12-18 – 2011-12-22 (×2): 10 mL

## 2011-12-18 MED ORDER — BISACODYL 10 MG RE SUPP
10.0000 mg | Freq: Once | RECTAL | Status: AC
  Administered 2011-12-18: 10 mg via RECTAL
  Filled 2011-12-18: qty 1

## 2011-12-18 NOTE — Progress Notes (Signed)
Dr. Margarita Grizzle called and given body fluid culture results. Continue current plan of care.

## 2011-12-18 NOTE — Progress Notes (Addendum)
Urology Progress Note  Subjective:     No acute urologic events overnight. Afebrile. Pain controlled.  Reviewed the findings of the CT with the patient; there is no evidence of perinephric abscess.   ROS: Negative: SOB  Objective:  Patient Vitals for the past 24 hrs:  BP Temp Temp src Pulse Resp SpO2  12/18/11 0624 145/76 mmHg 98.5 F (36.9 C) Oral 65  18  96 %  12/17/11 2256 139/82 mmHg 98.1 F (36.7 C) Oral 76  18  95 %    Physical Exam: General:  No acute distress, awake Cardiovascular:    [x]   S1/S2 present, RRR  []   Irregularly irregular Chest:  CTA-B Abdomen:               []  Soft, appropriately TTP  [x]  Soft, NTTP  []  Soft, appropriately TTP, incision(s) clean/dry/intact  Genitourinary: Left flank without ecchymosis or TTP. Surgical site clean/dry/intact. Right flank wound packed and is clean without erythema. Packing with serosanguinous fluid. Foley:  Draining clear yellow urine.    I/O last 3 completed shifts: In: 1078.3 [P.O.:680; I.V.:348.3; IV Piggyback:50] Out: 3025 [Urine:3025]  Recent Labs  Basename 12/18/11 0700 12/15/11 0830   HGB 11.0* 11.9*   WBC 6.5 8.4   PLT 366 392    Recent Labs  Basename 12/18/11 0700 12/15/11 0830   NA 136 138   K 4.1 3.9   CL 105 102   CO2 23 24   BUN 14 17   CREATININE 0.85 0.96   CALCIUM 8.8 9.6   GFRNONAA 79* 68*   GFRAA >90 79*     Recent Labs  Basename 12/15/11 0830   INR 1.03   APTT 34     No components found with this basename: ABG:2    Length of stay: 3 days.  Assessment: Left nephrolithiasis POD#3 left PCNL. Right percutaneous site drainage. Plan: -Cultures negative for growth thus far. -Discontinue vancomycin and gentamicin; start doxycycline PO. -Continue wound packing per nursing staff. -Regular diet. -Continue admission to ensure she remains afebrile. Likely plan to discharge home tomorrow.  Natalia Leatherwood, MD [phone]   ADDENDUM: Wound cultures are beginning to grow  gram-positive cocci. Because of her difficult vascular access it would likely be prudent to await the final results to determine whether or not she will need home IV antibiotics. This will likely delayed her discharge.

## 2011-12-19 LAB — BASIC METABOLIC PANEL
CO2: 23 mEq/L (ref 19–32)
Chloride: 108 mEq/L (ref 96–112)
Creatinine, Ser: 0.8 mg/dL (ref 0.50–1.10)
Glucose, Bld: 79 mg/dL (ref 70–99)
Sodium: 139 mEq/L (ref 135–145)

## 2011-12-19 MED ORDER — POTASSIUM CHLORIDE 20 MEQ/15ML (10%) PO LIQD
40.0000 meq | Freq: Once | ORAL | Status: AC
  Administered 2011-12-19: 40 meq via ORAL
  Filled 2011-12-19: qty 30

## 2011-12-19 NOTE — Discharge Instructions (Signed)
DISCHARGE INSTRUCTIONS FOR PCNL   MEDICATIONS:  1. DO NOT RESUME YOUR IBUPROFEN, or any other medicines like aspirin, motrin, excedrin, advil, aleve, vitamin E, fish oil as these can all cause bleeding x 10 days.  2. Resume all your other meds from home - except do not take any other pain meds that you may have at home.  ACTIVITY 1. No strenuous activity, sexual activity, or lifting greater than 10 pounds for 3 weeks. 2. No driving while on narcotic pain medications 3. Drink plenty of water 4. Continue to walk at home - you can still get blood clots when you are at home, so keep active, but don't over do it. 5. May return to work in 1 week (but not heavy or strenuous activity).   BATHING 1. You can shower and we recommend daily showers.  Cover your wound with a dressing and remove the dressing immediately after the shower.  Do not submerge wound under water.   Wound care: Have your home health nurse change the packing in your right back wound daily and cover it with a dry gauze or ABD pad daily. This packing should be done daily until the wound can no longer be packed.  SIGNS/SYMPTOMS TO CALL: 1. Please call us if you have a fever greater than 101.5, uncontrolled nausea/vomiting, uncontrolled pain, dizziness, unable to urinate, chest pain, shortness of breath, leg swelling, leg pain, redness around wound, drainage from wound, or any other concerns or questions. 2. You can reach [place] at [phone].

## 2011-12-19 NOTE — Progress Notes (Signed)
Urology Progress Note  Subjective:     No acute urologic events overnight. Afebrile. Pain controlled.    ROS: Negative: SOB  Objective:  Patient Vitals for the past 24 hrs:  BP Temp Temp src Pulse Resp SpO2  12/19/11 0446 137/78 mmHg 98 F (36.7 C) Oral 71  16  98 %  12/18/11 2123 138/86 mmHg 97.5 F (36.4 C) Oral 69  16  97 %  12/18/11 1428 119/74 mmHg 98 F (36.7 C) Oral 78  20  97 %  12/18/11 1027 155/74 mmHg - - 90  - -    Physical Exam: General:  No acute distress, awake Cardiovascular:    [x]   S1/S2 present, RRR  []   Irregularly irregular Chest:  CTA-B Abdomen:               []  Soft, appropriately TTP  [x]  Soft, NTTP  []  Soft, appropriately TTP, incision(s) clean/dry/intact  Genitourinary: Left flank without ecchymosis or TTP. Surgical site clean/dry/intact. Right flank wound packed and is clean without erythema. Packing with serosanguinous fluid. Foley:  Draining clear yellow urine.    I/O last 3 completed shifts: In: 2478.3 [P.O.:1280; I.V.:1148.3; IV Piggyback:50] Out: 2800 [Urine:2800]  Recent Labs  Basename 12/18/11 0700   HGB 11.0*   WBC 6.5   PLT 366    Recent Labs  Basename 12/19/11 0420 12/18/11 0700   NA 139 136   K 3.4* 4.1   CL 108 105   CO2 23 23   BUN 16 14   CREATININE 0.80 0.85   CALCIUM 8.4 8.8   GFRNONAA 85* 79*   GFRAA >90 >90     No results found for this basename: PT:2,INR:2,APTT:2 in the last 72 hours   No components found with this basename: ABG:2    Length of stay: 4 days.  Assessment: Left nephrolithiasis POD#4 left PCNL. Right percutaneous site drainage. Plan: -Cultures positive for gram positive cocci. Await final results. -Continue doxycycline PO. -Continue wound packing per nursing staff. -Potassium replacement with PO liquid this morning. -Will order overlay for her mattress.  Natalia Leatherwood, MD [phone]

## 2011-12-20 LAB — BODY FLUID CULTURE: Special Requests: NORMAL

## 2011-12-20 LAB — BASIC METABOLIC PANEL
CO2: 25 mEq/L (ref 19–32)
Glucose, Bld: 83 mg/dL (ref 70–99)
Potassium: 3.6 mEq/L (ref 3.5–5.1)
Sodium: 139 mEq/L (ref 135–145)

## 2011-12-20 MED ORDER — POLYETHYLENE GLYCOL 3350 17 G PO PACK
17.0000 g | PACK | Freq: Every day | ORAL | Status: DC
  Administered 2011-12-20 – 2011-12-21 (×2): 17 g via ORAL
  Filled 2011-12-20 (×3): qty 1

## 2011-12-20 MED ORDER — BISACODYL 10 MG RE SUPP
10.0000 mg | Freq: Every day | RECTAL | Status: DC | PRN
  Administered 2011-12-20: 10 mg via RECTAL
  Filled 2011-12-20: qty 1

## 2011-12-20 NOTE — Progress Notes (Signed)
  Subjective: Patient reports : Feels fine.  Objective: Vital signs in last 24 hours: Temp:  [97.9 F (36.6 C)-98.4 F (36.9 C)] 98.4 F (36.9 C) (06/29 0624) Pulse Rate:  [73-83] 82  (06/29 0624) Resp:  [18] 18  (06/29 0624) BP: (111-145)/(70-85) 123/84 mmHg (06/29 0624) SpO2:  [90 %-98 %] 97 % (06/29 0624)  Intake/Output from previous day: 06/28 0701 - 06/29 0700 In: 2745 [P.O.:600; I.V.:2145] Out: 4200 [Urine:4200] Intake/Output this shift: Total I/O In: 1200 [I.V.:1200] Out: -   Physical Exam:  General: Wound clean.  No wound drainage. Foley draining clear urine.  Lab Results:  Basename 12/18/11 0700  HGB 11.0*  HCT 35.4*   BMET  Basename 12/20/11 0448 12/19/11 0420  NA 139 139  K 3.6 3.4*  CL 108 108  CO2 25 23  GLUCOSE 83 79  BUN 15 16  CREATININE 0.68 0.80  CALCIUM 8.2* 8.4   No results found for this basename: LABPT:3,INR:3 in the last 72 hours No results found for this basename: LABURIN:1 in the last 72 hours Results for orders placed during the hospital encounter of 12/15/11  BODY FLUID CULTURE     Status: Normal (Preliminary result)   Collection Time   12/16/11  4:33 PM      Component Value Range Status Comment   Specimen Description BACK SWAB   Final    Special Requests Normal   Final    Gram Stain     Final    Value: FEW WBC PRESENT, PREDOMINANTLY PMN     NO ORGANISMS SEEN   Culture     Final    Value: ABUNDANT STREPTOCOCCUS GROUP F     Note: CRITICAL RESULT CALLED TO, READ BACK BY AND VERIFIED WITH: JENNIFER  HUFF @1116  12/18/11 BY KRAWS   Report Status PENDING   Incomplete   BODY FLUID CULTURE     Status: Normal (Preliminary result)   Collection Time   12/16/11  4:36 PM      Component Value Range Status Comment   Specimen Description BACK SWAB   Final    Special Requests NORMAL   Final    Gram Stain     Final    Value: NO WBC SEEN     NO SQUAMOUS EPITHELIAL CELLS SEEN     NO ORGANISMS SEEN   Culture     Final    Value: ABUNDANT  STREPTOCOCCUS GROUP F     Note: CRITICAL RESULT CALLED TO, READ BACK BY AND VERIFIED WITH: JENNIFER  HUFF @1116  12/18/11 BY KRAWS   Report Status PENDING   Incomplete   SURGICAL PCR SCREEN     Status: Normal   Collection Time   12/17/11 12:55 PM      Component Value Range Status Comment   MRSA, PCR NEGATIVE  NEGATIVE Final    Staphylococcus aureus NEGATIVE  NEGATIVE Final     Studies/Results: No results found.  Assessment/Plan:  S/P PCNL  Infected wound right PCNL.  Culture pending.  Continue Doxycycline.   LOS: 5 days   Yaden Seith-HENRY 12/20/2011, 11:27 AM

## 2011-12-21 MED ORDER — AMPICILLIN 500 MG PO CAPS
500.0000 mg | ORAL_CAPSULE | Freq: Four times a day (QID) | ORAL | Status: DC
  Administered 2011-12-21 – 2011-12-22 (×5): 500 mg via ORAL
  Filled 2011-12-21 (×8): qty 1

## 2011-12-21 NOTE — Progress Notes (Signed)
  Subjective: Patient reports No bowel movements.  Had Dulcolax and Miralax  Objective: Vital signs in last 24 hours: Temp:  [98.3 F (36.8 C)-98.4 F (36.9 C)] 98.3 F (36.8 C) (06/30 0612) Pulse Rate:  [67-75] 75  (06/30 0612) Resp:  [18] 18  (06/30 0612) BP: (103-128)/(67-74) 103/67 mmHg (06/30 0612) SpO2:  [96 %-100 %] 97 % (06/30 0612)  Intake/Output from previous day: 06/29 0701 - 06/30 0700 In: 3100 [P.O.:720; I.V.:2380] Out: 3051 [Urine:3050; Stool:1] Intake/Output this shift:    Physical Exam:  Lungs - Normal respiratory effort, chest expands symmetrically.  Abdomen - Soft, non-tender & non-distended Wound culture: Streptococcus sensitive to Penicillin. BMET  Basename 12/20/11 0448 12/19/11 0420  NA 139 139  K 3.6 3.4*  CL 108 108  CO2 25 23  GLUCOSE 83 79  BUN 15 16  CREATININE 0.68 0.80  CALCIUM 8.2* 8.4   No results found for this basename: LABPT:3,INR:3 in the last 72 hours No results found for this basename: LABURIN:1 in the last 72 hours Results for orders placed during the hospital encounter of 12/15/11  BODY FLUID CULTURE     Status: Normal   Collection Time   12/16/11  4:33 PM      Component Value Range Status Comment   Specimen Description BACK SWAB   Final    Special Requests Normal   Final    Gram Stain     Final    Value: FEW WBC PRESENT, PREDOMINANTLY PMN     NO ORGANISMS SEEN   Culture     Final    Value: ABUNDANT STREPTOCOCCUS GROUP F     Note: CRITICAL RESULT CALLED TO, READ BACK BY AND VERIFIED WITH: JENNIFER  HUFF @1116  12/18/11 BY KRAWS   Report Status 12/20/2011 FINAL   Final    Organism ID, Bacteria STREPTOCOCCUS GROUP F   Final   BODY FLUID CULTURE     Status: Normal   Collection Time   12/16/11  4:36 PM      Component Value Range Status Comment   Specimen Description BACK SWAB   Final    Special Requests NORMAL   Final    Gram Stain     Final    Value: NO WBC SEEN     NO SQUAMOUS EPITHELIAL CELLS SEEN     NO ORGANISMS SEEN     Culture     Final    Value: ABUNDANT STREPTOCOCCUS GROUP F     Note: CRITICAL RESULT CALLED TO, READ BACK BY AND VERIFIED WITH: JENNIFER  HUFF @1116  12/18/11 BY KRAWS   Report Status 12/20/2011 FINAL   Final   SURGICAL PCR SCREEN     Status: Normal   Collection Time   12/17/11 12:55 PM      Component Value Range Status Comment   MRSA, PCR NEGATIVE  NEGATIVE Final    Staphylococcus aureus NEGATIVE  NEGATIVE Final      Assessment/Plan:  Wound infection.  Fecal disimpaction  Ampicillin 500 mgm Q 6H  Probable discharge in AM.   LOS: 6 days   [name] 12/21/2011, 10:48 AM

## 2011-12-22 MED ORDER — AMPICILLIN 500 MG PO CAPS: 500.0000 mg | ORAL_CAPSULE | Freq: Four times a day (QID) | ORAL | Status: AC

## 2011-12-22 NOTE — Discharge Summary (Signed)
Physician Discharge Summary  Patient ID: [name] MRN: [id number] DOB/AGE: [birth year] 49 y.o.  Admit date: 12/15/2011 Discharge date: 12/22/2011  Admission Diagnoses: Left nephrolithiasis  Discharge Diagnoses:  Principal Problem:  *Nephrolithiasis Right flank surgical wound infection  Discharged Condition: good  Hospital Course:  The patient was admitted for observation following a left percutaneous nephrostolithotomy on December 15, 2011. She did well and was scheduled to be discharged home the following day, but her previous right-sided surgical site began to drain with what appeared to be purulent material. This was opened at the bedside and swabs were sent for culture. It was irrigated and packed with clean packing daily. A PICC line was placed for IV access due to the patient's difficult vascular access and a CT scan was performed to ensure that there was no right-sided perinephric abscess. The CT scan revealed no perinephric abscess. The patient was continued on IV and then oral antibiotics. Wound cultures returned positive for Streptococcus group F, sensitive to penicillin. She was started on ampicillin and tolerated this medication well. Home health was set up for daily wound packing, wound checks, and to provide supplies for wound packing. She was discharged home on ampicillin. Her PICC line was removed prior to discharge home.  She will be scheduled for left-sided ureteroscopy to manage the remaining left-sided stone burden in her kidney.  Consults: Interventional radiology- PICC line placement.  Significant Diagnostic Studies: microbiology: blood culture: positive for streptococcus group F and radiology: CT scan: Negative for right perinephric abscess.  Treatments: antibiotics: Ancef, gentamycin, doxycycline and ampicillin, procedures: PICC line and surgery: Left percutaneous nephrostolithotomy.  Discharge Exam: Blood pressure 107/70, pulse 83, temperature 98.6 F (37 C),  temperature source Oral, resp. rate 18, height 5\' 2"  (1.575 m), weight 113.3 kg (249 lb 12.5 oz), SpO2 97.00%. See PE from progress note on date of discharge.  Disposition: 01-Home or Self Care with home health nurse for wound packing changes.  Discharge Orders    Future Orders Please Complete By Expires   Discharge patient        Medication List  As of 12/22/2011  7:05 AM   STOP taking these medications         traMADol 50 MG tablet         TAKE these medications         acetaminophen 500 MG tablet   Commonly known as: TYLENOL   Take 500 mg by mouth every 6 (six) hours as needed. pain      amLODipine 10 MG tablet   Commonly known as: NORVASC   Take 1 tablet (10 mg total) by mouth daily.      ampicillin 500 MG capsule   Commonly known as: PRINCIPEN   Take 1 capsule (500 mg total) by mouth every 6 (six) hours.      loratadine 10 MG tablet   Commonly known as: CLARITIN   Take 10 mg by mouth daily.      oxyCODONE 5 MG immediate release tablet   Commonly known as: Oxy IR/ROXICODONE   Take 1-2 tablets (5-10 mg total) by mouth every 4 (four) hours as needed.      predniSONE 10 MG tablet   Commonly known as: DELTASONE   Take 10 mg by mouth See admin instructions.      senna-docusate 8.6-50 MG per tablet   Commonly known as: Senokot-S   Take 1 tablet by mouth 2 (two) times daily.  Follow-up Information    Follow up with [facility], NP on 01/02/2012. (11:30 am)    Contact information:   [address] [facility] [address] [place] [address] [facility] [place] [address] [phone]          Signed: Milford Cage 12/22/2011, 7:05 AM

## 2011-12-22 NOTE — Progress Notes (Signed)
Urology Progress Note  Subjective:     No acute urologic events overnight. Afebrile. Pain controlled. Positive large bowel movement.    ROS: Negative: SOB  Objective:  Patient Vitals for the past 24 hrs:  BP Temp Temp src Pulse Resp SpO2  12/22/11 0649 107/70 mmHg 98.6 F (37 C) Oral 83  18  97 %  12/21/11 2143 113/74 mmHg 98.2 F (36.8 C) Oral 69  18  96 %  12/21/11 1408 128/78 mmHg 98.2 F (36.8 C) Oral 79  18  98 %    Physical Exam: General:  No acute distress, awake Cardiovascular:    [x]   S1/S2 present, RRR  []   Irregularly irregular Chest:  CTA-B Abdomen:               []  Soft, appropriately TTP  [x]  Soft, NTTP  []  Soft, appropriately TTP, incision(s) clean/dry/intact  Genitourinary: Left flank without ecchymosis or TTP. Surgical site clean/dry/intact. Right flank wound packed and is clean without erythema. Packing with serosanguinous fluid. Foley:  Draining clear yellow urine.    I/O last 3 completed shifts: In: 3900 [P.O.:720; I.V.:3180] Out: 5251 [Urine:5250; Stool:1]  No results found for this basename: HGB:2,WBC:2,PLT:2 in the last 72 hours  Recent Labs  [facility] 12/20/11 0448   NA 139   K 3.6   CL 108   CO2 25   BUN 15   CREATININE 0.68   CALCIUM 8.2*   GFRNONAA >90   GFRAA >90     No results found for this basename: PT:2,INR:2,APTT:2 in the last 72 hours   No components found with this basename: ABG:2    Length of stay: 7 days.  Assessment: Left nephrolithiasis POD#7 left PCNL. Right percutaneous site infection. Plan: -Cultures final, positive for streptococcus group F- sensitive to penicillin. Started on ampicillin yesterday. -Continue wound packing per nursing staff. Contact Case Manager to ensure home health is set up for home wound packing changes. -Likely discharge home today.   Natalia Leatherwood, MD [phone]

## 2011-12-23 ENCOUNTER — Other Ambulatory Visit: Payer: Self-pay | Admitting: Urology

## 2012-01-01 ENCOUNTER — Other Ambulatory Visit (HOSPITAL_COMMUNITY): Payer: Self-pay | Admitting: Urology

## 2012-01-01 DIAGNOSIS — N200 Calculus of kidney: Secondary | ICD-10-CM

## 2012-01-07 ENCOUNTER — Other Ambulatory Visit: Payer: Self-pay | Admitting: Urology

## 2012-01-15 ENCOUNTER — Encounter (HOSPITAL_COMMUNITY): Payer: Self-pay | Admitting: *Deleted

## 2012-01-27 NOTE — Progress Notes (Signed)
Pt notified of time change to 1:15 pm - instructed to arrive at 10:15 am to short stay at [facility] long

## 2012-01-28 ENCOUNTER — Ambulatory Visit (HOSPITAL_COMMUNITY)
Admission: RE | Admit: 2012-01-28 | Discharge: 2012-01-28 | Disposition: A | Discharge status: Home / Self Care | Payer: Managed Care, Other (non HMO) | Source: Ambulatory Visit | Attending: Urology | Admitting: Urology

## 2012-01-28 ENCOUNTER — Encounter (HOSPITAL_COMMUNITY)
Admission: RE | Disposition: A | Discharge status: Home / Self Care | Payer: Self-pay | Source: Ambulatory Visit | Attending: Urology

## 2012-01-28 ENCOUNTER — Encounter (HOSPITAL_COMMUNITY): Payer: Self-pay | Admitting: Anesthesiology

## 2012-01-28 ENCOUNTER — Ambulatory Visit (HOSPITAL_COMMUNITY): Payer: Managed Care, Other (non HMO) | Admitting: Anesthesiology

## 2012-01-28 ENCOUNTER — Encounter (HOSPITAL_COMMUNITY): Payer: Self-pay | Admitting: *Deleted

## 2012-01-28 DIAGNOSIS — N201 Calculus of ureter: Secondary | ICD-10-CM | POA: Insufficient documentation

## 2012-01-28 DIAGNOSIS — S88919A Complete traumatic amputation of unspecified lower leg, level unspecified, initial encounter: Secondary | ICD-10-CM | POA: Insufficient documentation

## 2012-01-28 DIAGNOSIS — Z79899 Other long term (current) drug therapy: Secondary | ICD-10-CM | POA: Insufficient documentation

## 2012-01-28 DIAGNOSIS — N200 Calculus of kidney: Secondary | ICD-10-CM

## 2012-01-28 DIAGNOSIS — G839 Paralytic syndrome, unspecified: Secondary | ICD-10-CM | POA: Insufficient documentation

## 2012-01-28 DIAGNOSIS — I10 Essential (primary) hypertension: Secondary | ICD-10-CM | POA: Insufficient documentation

## 2012-01-28 HISTORY — PX: CYSTOSCOPY WITH URETEROSCOPY: SHX5123

## 2012-01-28 HISTORY — PX: CYSTOSCOPY W/ URETERAL STENT PLACEMENT: SHX1429

## 2012-01-28 LAB — BASIC METABOLIC PANEL
CO2: 25 mEq/L (ref 19–32)
Calcium: 9.3 mg/dL (ref 8.4–10.5)
Chloride: 104 mEq/L (ref 96–112)
Creatinine, Ser: 0.68 mg/dL (ref 0.50–1.10)
Glucose, Bld: 83 mg/dL (ref 70–99)

## 2012-01-28 LAB — CBC
HCT: 41.7 % (ref 36.0–46.0)
Hemoglobin: 13 g/dL (ref 12.0–15.0)
MCH: 26 pg (ref 26.0–34.0)
MCV: 83.4 fL (ref 78.0–100.0)
RBC: 5 MIL/uL (ref 3.87–5.11)
WBC: 5.6 10*3/uL (ref 4.0–10.5)

## 2012-01-28 SURGERY — CYSTOSCOPY WITH URETEROSCOPY
Anesthesia: General | Site: Ureter | Laterality: Left | Wound class: Clean Contaminated

## 2012-01-28 MED ORDER — MIDAZOLAM HCL 5 MG/5ML IJ SOLN
INTRAMUSCULAR | Status: DC | PRN
  Administered 2012-01-28: 2 mg via INTRAVENOUS

## 2012-01-28 MED ORDER — FENTANYL CITRATE 0.05 MG/ML IJ SOLN
INTRAMUSCULAR | Status: DC | PRN
  Administered 2012-01-28: 50 ug via INTRAVENOUS

## 2012-01-28 MED ORDER — IOHEXOL 300 MG/ML  SOLN
INTRAMUSCULAR | Status: AC
  Filled 2012-01-28: qty 1

## 2012-01-28 MED ORDER — NEOSTIGMINE METHYLSULFATE 1 MG/ML IJ SOLN
INTRAMUSCULAR | Status: DC | PRN
  Administered 2012-01-28: 3 mg via INTRAVENOUS

## 2012-01-28 MED ORDER — CEFAZOLIN SODIUM-DEXTROSE 2-3 GM-% IV SOLR: 2.0000 g | INTRAVENOUS | Status: DC

## 2012-01-28 MED ORDER — ROCURONIUM BROMIDE 100 MG/10ML IV SOLN
INTRAVENOUS | Status: DC | PRN
  Administered 2012-01-28: 20 mg via INTRAVENOUS

## 2012-01-28 MED ORDER — SODIUM CHLORIDE 0.9 % IR SOLN
Status: DC | PRN
  Administered 2012-01-28: 3000 mL via INTRAVESICAL

## 2012-01-28 MED ORDER — GENTAMICIN SULFATE 40 MG/ML IJ SOLN
560.0000 mg | INTRAMUSCULAR | Status: AC
  Administered 2012-01-28: 560 mg via INTRAVENOUS
  Filled 2012-01-28: qty 14

## 2012-01-28 MED ORDER — GLYCOPYRROLATE 0.2 MG/ML IJ SOLN
INTRAMUSCULAR | Status: DC | PRN
  Administered 2012-01-28: 0.4 mg via INTRAVENOUS

## 2012-01-28 MED ORDER — HYOSCYAMINE SULFATE 0.125 MG PO TABS: 0.1250 mg | ORAL_TABLET | ORAL | Status: DC | PRN

## 2012-01-28 MED ORDER — ONDANSETRON HCL 4 MG/2ML IJ SOLN
INTRAMUSCULAR | Status: DC | PRN
  Administered 2012-01-28: 4 mg via INTRAVENOUS

## 2012-01-28 MED ORDER — LACTATED RINGERS IV SOLN
INTRAVENOUS | Status: DC | PRN
  Administered 2012-01-28 (×2): via INTRAVENOUS

## 2012-01-28 MED ORDER — CEFAZOLIN SODIUM 1-5 GM-% IV SOLN
INTRAVENOUS | Status: AC
  Filled 2012-01-28: qty 100

## 2012-01-28 MED ORDER — SODIUM CHLORIDE 0.9 % IR SOLN
Status: DC | PRN
  Administered 2012-01-28: 1000 mL via INTRAVESICAL
  Administered 2012-01-28: 2000 mL via INTRAVESICAL

## 2012-01-28 MED ORDER — LACTATED RINGERS IV SOLN
INTRAVENOUS | Status: DC
  Administered 2012-01-28: 1000 mL via INTRAVENOUS

## 2012-01-28 MED ORDER — CEFAZOLIN SODIUM 1-5 GM-% IV SOLN
INTRAVENOUS | Status: DC | PRN
  Administered 2012-01-28: 2 g via INTRAVENOUS

## 2012-01-28 MED ORDER — HYDROCODONE-ACETAMINOPHEN 5-325 MG PO TABS: 1.0000 | ORAL_TABLET | ORAL | Status: AC | PRN

## 2012-01-28 MED ORDER — PROPOFOL 10 MG/ML IV BOLUS
INTRAVENOUS | Status: DC | PRN
  Administered 2012-01-28: 160 mg via INTRAVENOUS

## 2012-01-28 MED ORDER — HYDROCODONE-ACETAMINOPHEN 5-325 MG PO TABS
1.0000 | ORAL_TABLET | Freq: Once | ORAL | Status: AC
  Administered 2012-01-28: 1 via ORAL
  Filled 2012-01-28: qty 1

## 2012-01-28 MED ORDER — HYDROMORPHONE HCL PF 1 MG/ML IJ SOLN: 0.2500 mg | INTRAMUSCULAR | Status: DC | PRN

## 2012-01-28 MED ORDER — MUPIROCIN 2 % EX OINT
TOPICAL_OINTMENT | Freq: Two times a day (BID) | CUTANEOUS | Status: DC
  Filled 2012-01-28: qty 22

## 2012-01-28 MED ORDER — DEXAMETHASONE SODIUM PHOSPHATE 10 MG/ML IJ SOLN
INTRAMUSCULAR | Status: DC | PRN
  Administered 2012-01-28: 10 mg via INTRAVENOUS

## 2012-01-28 MED ORDER — CEPHALEXIN 500 MG PO CAPS: 500.0000 mg | ORAL_CAPSULE | Freq: Three times a day (TID) | ORAL | Status: AC

## 2012-01-28 SURGICAL SUPPLY — 30 items
ADAPTER CATH URET PLST 4-6FR (CATHETERS) ×2 IMPLANT
ADPR CATH URET STRL DISP 4-6FR (CATHETERS) ×1
BAG URINE LEG 500ML (DRAIN) ×2 IMPLANT
BAG URO CATCHER STRL LF (DRAPE) ×2 IMPLANT
BASKET ZERO TIP NITINOL 2.4FR (BASKET) ×1 IMPLANT
BSKT STON RTRVL ZERO TP 2.4FR (BASKET) ×1
CATH INTERMIT  6FR 70CM (CATHETERS) ×2 IMPLANT
CATH SILASTIC FOLEY 18FRX5CC (CATHETERS) ×1 IMPLANT
CATH URET 5FR 28IN OPEN ENDED (CATHETERS) ×2 IMPLANT
CLOTH BEACON ORANGE TIMEOUT ST (SAFETY) ×2 IMPLANT
DRAPE CAMERA CLOSED 9X96 (DRAPES) ×2 IMPLANT
FIBER LASER TRAC TIP (UROLOGICAL SUPPLIES) ×1 IMPLANT
GLOVE BIOGEL PI IND STRL 7.5 (GLOVE) ×1 IMPLANT
GLOVE BIOGEL PI INDICATOR 7.5 (GLOVE) ×1
GLOVE ECLIPSE 7.5 STRL STRAW (GLOVE) ×2 IMPLANT
GLOVE SURG SS PI 8.0 STRL IVOR (GLOVE) ×2 IMPLANT
GOWN PREVENTION PLUS XLARGE (GOWN DISPOSABLE) ×2 IMPLANT
GOWN STRL NON-REIN LRG LVL3 (GOWN DISPOSABLE) ×2 IMPLANT
GOWN STRL REIN XL XLG (GOWN DISPOSABLE) ×2 IMPLANT
GUIDEWIRE ANG ZIPWIRE 038X150 (WIRE) IMPLANT
GUIDEWIRE STR DUAL SENSOR (WIRE) ×3 IMPLANT
GYRUS RUMI II 3.5CM BLUE (DISPOSABLE) ×2
HOVERMATT HALF SINGLE USE (PATIENT TRANSFER) ×1 IMPLANT
MANIFOLD NEPTUNE II (INSTRUMENTS) ×2 IMPLANT
MARKER SKIN DUAL TIP RULER LAB (MISCELLANEOUS) ×2 IMPLANT
PACK CYSTO (CUSTOM PROCEDURE TRAY) ×2 IMPLANT
RUMI II GYRUS 3.5CM BLUE (DISPOSABLE) IMPLANT
STENT CONTOUR 7FRX26X.038 (STENTS) ×1 IMPLANT
TUBING CONNECTING 10 (TUBING) ×2 IMPLANT
WIRE COONS/BENSON .038X145CM (WIRE) ×2 IMPLANT
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
## 2012-01-28 NOTE — Anesthesia Postprocedure Evaluation (Signed)
  Anesthesia Post-op Note  Patient: [name]  Procedure(s) Performed: Procedure(s) (LRB): CYSTOSCOPY WITH URETEROSCOPY (Left) HOLMIUM LASER APPLICATION (Left) CYSTOSCOPY WITH STENT REPLACEMENT (Left)  Patient Location: PACU  Anesthesia Type: General  Level of Consciousness: oriented and sedated  Airway and Oxygen Therapy: Patient Spontanous Breathing and Patient connected to nasal cannula oxygen  Post-op Pain: mild  Post-op Assessment: Post-op Vital signs reviewed, Patient's Cardiovascular Status Stable, Respiratory Function Stable and Patent Airway  Post-op Vital Signs: stable  Complications: No apparent anesthesia complications

## 2012-01-28 NOTE — Transfer of Care (Signed)
Immediate Anesthesia Transfer of Care Note  Patient: [name]  Procedure(s) Performed: Procedure(s) (LRB): CYSTOSCOPY WITH URETEROSCOPY (Left) HOLMIUM LASER APPLICATION (Left) CYSTOSCOPY WITH STENT REPLACEMENT (Left)  Patient Location: PACU  Anesthesia Type: General  Level of Consciousness: sedated  Airway & Oxygen Therapy: Patient Spontanous Breathing and Patient connected to face mask oxygen  Post-op Assessment: Report given to PACU RN and Post -op Vital signs reviewed and stable  Post vital signs: Reviewed and stable  Complications: No apparent anesthesia complications

## 2012-01-28 NOTE — Anesthesia Procedure Notes (Signed)
Procedure Name: Intubation Date/Time: 01/28/2012 12:33 PM Performed by: Doran Clay Pre-anesthesia Checklist: Patient identified, Timeout performed, Emergency Drugs available, Suction available and Patient being monitored Patient Re-evaluated:Patient Re-evaluated prior to inductionOxygen Delivery Method: Circle system utilized Preoxygenation: Pre-oxygenation with 100% oxygen Intubation Type: IV induction Ventilation: Mask ventilation without difficulty Laryngoscope Size: Mac and 4 Grade View: Grade I Tube type: Oral Tube size: 7.0 mm Number of attempts: 1 Airway Equipment and Method: Stylet Placement Confirmation: ETT inserted through vocal cords under direct vision,  breath sounds checked- equal and bilateral and positive ETCO2 Secured at: 22 cm Tube secured with: Tape Dental Injury: Teeth and Oropharynx as per pre-operative assessment

## 2012-01-28 NOTE — Anesthesia Preprocedure Evaluation (Addendum)
Anesthesia Evaluation  Patient identified by MRN, date of birth, ID band Patient awake  General Assessment Comment:Difficult IV access  Reviewed: Allergy & Precautions, H&P , NPO status , Patient's Chart, lab work & pertinent test results, reviewed documented beta blocker date and time   Airway Mallampati: II  Neck ROM: Full    Dental  (+) Teeth Intact and Dental Advisory Given   Pulmonary neg pulmonary ROS,  breath sounds clear to auscultation        Cardiovascular hypertension, Pt. on medications Rhythm:Regular Rate:Normal  Denies cardiac symptoms   Neuro/Psych T5 paraplegia negative psych ROS   GI/Hepatic negative GI ROS, Neg liver ROS,   Endo/Other  Morbid obesitySteroid coverage  Renal/GU Kidney stone  negative genitourinary   Musculoskeletal negative musculoskeletal ROS (+)   Abdominal   Peds negative pediatric ROS (+)  Hematology negative hematology ROS (+)   Anesthesia Other Findings   Reproductive/Obstetrics negative OB ROS                          Anesthesia Physical Anesthesia Plan  ASA: III  Anesthesia Plan: General   Post-op Pain Management:    Induction: Intravenous  Airway Management Planned: Oral ETT  Additional Equipment:   Intra-op Plan:   Post-operative Plan: Extubation in OR  Informed Consent: I have reviewed the patients History and Physical, chart, labs and discussed the procedure including the risks, benefits and alternatives for the proposed anesthesia with the patient or authorized representative who has indicated his/her understanding and acceptance.   Dental advisory given  Plan Discussed with: CRNA and Surgeon  Anesthesia Plan Comments:         Anesthesia Quick Evaluation

## 2012-01-28 NOTE — Brief Op Note (Signed)
01/28/2012  2:35 PM  PATIENT:  [name]  51 y.o. female  PRE-OPERATIVE DIAGNOSIS:  left nephrolithiasis  POST-OPERATIVE DIAGNOSIS:  same  PROCEDURE:  Procedure(s) (LRB): CYSTOSCOPY WITH URETEROSCOPY (Left) HOLMIUM LASER APPLICATION (Left) CYSTOSCOPY WITH STENT REPLACEMENT (Left)  SURGEON:  Surgeon(s) and Role:    * Milford Cage, MD - Primary  PHYSICIAN ASSISTANT:   ASSISTANTS: none   ANESTHESIA:   general  EBL: None   Total I/O In: 1400 [I.V.:1400] Out: 575 [Urine:575]  BLOOD ADMINISTERED:none  DRAINS: Urinary Catheter (Foley)   LOCAL MEDICATIONS USED:  NONE  SPECIMEN:  Source of Specimen:  left renal stone fragments.  DISPOSITION OF SPECIMEN:  Provided to patient.  COUNTS:  YES  TOURNIQUET:  * No tourniquets in log *  DICTATION: .Other Dictation: Dictation Number [id number]  PLAN OF CARE: Discharge to home after PACU  PATIENT DISPOSITION:  PACU - hemodynamically stable.   Delay start of Pharmacological VTE agent (>24hrs) due to surgical blood loss or risk of bleeding: no

## 2012-01-28 NOTE — Progress Notes (Signed)
Spoke to [name] in [place] and informed her of unable to obtain blood specimen and pt states she is a very difficult stick.

## 2012-01-29 ENCOUNTER — Encounter (HOSPITAL_COMMUNITY): Payer: Self-pay | Admitting: Urology

## 2012-01-29 NOTE — Op Note (Signed)
NAMESOO, [name] NO.:  [id number]  MEDICAL RECORD NO.:  [id number]  LOCATION:  [facility]                         FACILITY:  [facility]  PHYSICIAN:  Natalia Leatherwood, MD    DATE OF BIRTH:  1960/07/04  DATE OF PROCEDURE: DATE OF DISCHARGE:  01/28/2012                              OPERATIVE REPORT   SURGEON:  Natalia Leatherwood, MD  ASSISTANT:  None.  PREOPERATIVE DIAGNOSIS:  Left nephrolithiasis.  POSTOP DIAGNOSIS:  Left nephrolithiasis.  PROCEDURE PERFORMED: 1. Cystoscopy. 2. Left ureteroscopy. 3. Laser lithotripsy. 4. Basket stone retrieval. 5. Left ureteral stent removal. 6. Left ureteral stent placement. 7. Fluoroscopy with interpretation less than 1 hour.  COMPLICATIONS:  None.  ESTIMATED BLOOD LOSS:  Minimal.  SPECIMEN:  Left kidney stones were discarded.  DRAINS:  An 18-French Foley catheter.  FINDINGS:  Large left mid lower pole stone and a 5-mm mid pole stone.  HISTORY OF PRESENT ILLNESS:  This is a 51 year old female with paraplegia and bilateral below-the-knee amputations.  She has history of bilateral nephrolithiasis.  She had a left percutaneous nephrostolithotomy to remove the large stone burden, however, there remained approximately 15 mm stone in her left mid to lower pole kidney and a 5 mm stone.  These were difficult to access through percutaneous approach due to the position, and therefore, ureteroscopy would have been more successful.  The patient presents today for that procedure.  PROCEDURE IN DETAIL:  After informed consent was obtained, the patient was taken to the operating room.  She was placed in a supine position. IV antibiotics were infused and general anesthesia was induced.  She was then placed in dorsal lithotomy position making sure to pad all pertinent neurovascular pressure points appropriately.  After this was performed her genitals were prepped and draped in usual sterile fashion after her Foley catheter was removed.   Time-out was performed, which the correct patient, surgical site, and procedure were identified and agreed upon by the team.  Rigid cystoscope was advanced through the urethra into the bladder.  A Sensor tip wire was placed up along the stent and into the left renal pelvis on fluoroscopy.  The stent grasper was used to grasp the end of the ureteral stent on the left and was brought to urethral meatus.  Due to incrustation I was unable to pass a Sensor wire up along this, and therefore, I removed the stent and placed a sensor tip wire along the first sensor tip wire.  One wire was used as a working wire.  The other was used as a safety wire and secured to the drape.  The bladder was drained.  The scope was removed.  A 12-14 ureteral access sheath was placed over the working wire up into the renal pelvis.  The obturator and working wire were removed. Flexible digital ureteroscope was passed up this and to the left renal pelvis.  It was evaluated in a systematic fashion.  There was found to be a stone in the midpole, which was approximately 5 mm in size as well as a very large stone which correspond with a 15 mm stone in the mid lower pole.  She has a  200 micron holmium laser filament with a round  tip was placed and lithotripsy carried out at 0.5 joules and 20 Hz. Because of the position of the stone the lithotripsy took over 1 hour.  After this was done, the ZeroTip Nitinol basket was used to grasp and remove pieces of the stone fragments.  Due to the long lithotripsy time, there was some bleeding from the mucosa making it difficult to visualize and remove further pieces.  It was felt that leaving a stent would allow many of the fragments to pass and then we could reassess at a later time whether repeat ureteroscopy will be needed. Therefore, the ureteroscope and ureteral access sheath were removed and the entirety of the ureter was visualized.  There were no injuries noted to the ureter  mucosa.  Next, a rigid cystoscope was loaded over the safety wire and a 7 x 26 double-J ureteral stent was placed over the wire with a good curl in the left renal pelvis.  There was good curl in the bladder on direct visualization.  The string was removed from the stent.  The cystoscope was removed and 18-French Foley catheter was placed into the patient's bladder with 10 mL sterile water placed into the balloon.  This completed the procedure.  The patient was placed back in supine position.  Anesthesia was reversed and she was taken to the PACU in stable condition.          ______________________________ Natalia Leatherwood, MD     DW/MEDQ  D:  01/28/2012  T:  01/29/2012  Job:  [id number]

## 2012-11-10 ENCOUNTER — Other Ambulatory Visit (HOSPITAL_COMMUNITY): Payer: Self-pay | Admitting: Urology

## 2012-11-10 DIAGNOSIS — N200 Calculus of kidney: Secondary | ICD-10-CM

## 2012-11-23 ENCOUNTER — Encounter (HOSPITAL_COMMUNITY): Payer: Self-pay

## 2012-11-23 ENCOUNTER — Emergency Department (HOSPITAL_COMMUNITY): Payer: Managed Care, Other (non HMO)

## 2012-11-23 ENCOUNTER — Emergency Department (HOSPITAL_COMMUNITY)
Admission: EM | Admit: 2012-11-23 | Discharge: 2012-11-23 | Disposition: A | Discharge status: Home / Self Care | Payer: Managed Care, Other (non HMO) | Attending: Emergency Medicine | Admitting: Emergency Medicine

## 2012-11-23 DIAGNOSIS — R51 Headache: Secondary | ICD-10-CM | POA: Insufficient documentation

## 2012-11-23 DIAGNOSIS — N200 Calculus of kidney: Secondary | ICD-10-CM | POA: Insufficient documentation

## 2012-11-23 DIAGNOSIS — N390 Urinary tract infection, site not specified: Secondary | ICD-10-CM | POA: Insufficient documentation

## 2012-11-23 DIAGNOSIS — G839 Paralytic syndrome, unspecified: Secondary | ICD-10-CM | POA: Insufficient documentation

## 2012-11-23 DIAGNOSIS — Z9089 Acquired absence of other organs: Secondary | ICD-10-CM | POA: Insufficient documentation

## 2012-11-23 DIAGNOSIS — I10 Essential (primary) hypertension: Secondary | ICD-10-CM | POA: Insufficient documentation

## 2012-11-23 DIAGNOSIS — Z87891 Personal history of nicotine dependence: Secondary | ICD-10-CM | POA: Insufficient documentation

## 2012-11-23 DIAGNOSIS — S88919A Complete traumatic amputation of unspecified lower leg, level unspecified, initial encounter: Secondary | ICD-10-CM | POA: Insufficient documentation

## 2012-11-23 LAB — CBC WITH DIFFERENTIAL/PLATELET
Basophils Relative: 0 % (ref 0–1)
Eosinophils Absolute: 0.1 10*3/uL (ref 0.0–0.7)
MCH: 25.3 pg — ABNORMAL LOW (ref 26.0–34.0)
MCHC: 31.9 g/dL (ref 30.0–36.0)
Neutrophils Relative %: 79 % — ABNORMAL HIGH (ref 43–77)
Platelets: 355 10*3/uL (ref 150–400)
RBC: 4.43 MIL/uL (ref 3.87–5.11)

## 2012-11-23 LAB — URINALYSIS, ROUTINE W REFLEX MICROSCOPIC
Nitrite: POSITIVE — AB
Protein, ur: 100 mg/dL — AB
Urobilinogen, UA: 1 mg/dL (ref 0.0–1.0)

## 2012-11-23 LAB — URINE MICROSCOPIC-ADD ON

## 2012-11-23 MED ORDER — ONDANSETRON HCL 4 MG PO TABS: 4.0000 mg | ORAL_TABLET | Freq: Four times a day (QID) | ORAL | Status: DC

## 2012-11-23 MED ORDER — ONDANSETRON HCL 4 MG/2ML IJ SOLN
4.0000 mg | Freq: Once | INTRAMUSCULAR | Status: AC
  Administered 2012-11-23: 4 mg via INTRAVENOUS
  Filled 2012-11-23: qty 2

## 2012-11-23 MED ORDER — CEPHALEXIN 250 MG PO CAPS
250.0000 mg | ORAL_CAPSULE | Freq: Once | ORAL | Status: AC
  Administered 2012-11-23: 250 mg via ORAL
  Filled 2012-11-23: qty 1

## 2012-11-23 MED ORDER — CEPHALEXIN 250 MG PO CAPS: 500.0000 mg | ORAL_CAPSULE | Freq: Three times a day (TID) | ORAL | Status: DC

## 2012-11-23 MED ORDER — HYDROCODONE-ACETAMINOPHEN 5-325 MG PO TABS: 2.0000 | ORAL_TABLET | ORAL | Status: DC | PRN

## 2012-11-23 MED ORDER — MORPHINE SULFATE 4 MG/ML IJ SOLN
4.0000 mg | Freq: Once | INTRAMUSCULAR | Status: AC
  Administered 2012-11-23: 4 mg via INTRAVENOUS
  Filled 2012-11-23: qty 1

## 2012-11-23 NOTE — ED Provider Notes (Signed)
Medical screening examination/treatment/procedure(s) were performed by non-physician practitioner and as supervising physician I was immediately available for consultation/collaboration.  Jasmine Awe, MD 11/23/12 [phone]
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
## 2012-11-23 NOTE — ED Notes (Signed)
Pt complains of abd pain and a possible UTI for three weeks, unable to sleep and complains of a headache

## 2012-11-24 LAB — URINE CULTURE: Colony Count: >=100000

## 2012-11-25 ENCOUNTER — Other Ambulatory Visit (HOSPITAL_COMMUNITY): Payer: Self-pay | Admitting: Urology

## 2012-11-25 ENCOUNTER — Other Ambulatory Visit: Payer: Self-pay | Admitting: Urology

## 2012-11-25 DIAGNOSIS — L0291 Cutaneous abscess, unspecified: Secondary | ICD-10-CM

## 2012-11-25 DIAGNOSIS — T148XXA Other injury of unspecified body region, initial encounter: Secondary | ICD-10-CM

## 2012-11-25 DIAGNOSIS — L089 Local infection of the skin and subcutaneous tissue, unspecified: Secondary | ICD-10-CM

## 2012-11-25 DIAGNOSIS — L03319 Cellulitis of trunk, unspecified: Secondary | ICD-10-CM

## 2012-12-03 ENCOUNTER — Inpatient Hospital Stay (HOSPITAL_COMMUNITY)
Admission: EM | Admit: 2012-12-03 | Discharge: 2012-12-09 | DRG: 690 | Disposition: A | Discharge status: Home Health | Payer: Managed Care, Other (non HMO) | Attending: Family Medicine | Admitting: Family Medicine

## 2012-12-03 ENCOUNTER — Inpatient Hospital Stay (HOSPITAL_COMMUNITY): Payer: Managed Care, Other (non HMO)

## 2012-12-03 ENCOUNTER — Emergency Department (HOSPITAL_COMMUNITY): Payer: Managed Care, Other (non HMO)

## 2012-12-03 ENCOUNTER — Encounter (HOSPITAL_COMMUNITY): Payer: Self-pay | Admitting: Emergency Medicine

## 2012-12-03 DIAGNOSIS — I10 Essential (primary) hypertension: Secondary | ICD-10-CM

## 2012-12-03 DIAGNOSIS — M549 Dorsalgia, unspecified: Secondary | ICD-10-CM | POA: Diagnosis present

## 2012-12-03 DIAGNOSIS — T798XXA Other early complications of trauma, initial encounter: Secondary | ICD-10-CM

## 2012-12-03 DIAGNOSIS — T8183XA Persistent postprocedural fistula, initial encounter: Secondary | ICD-10-CM

## 2012-12-03 DIAGNOSIS — Z113 Encounter for screening for infections with a predominantly sexual mode of transmission: Secondary | ICD-10-CM

## 2012-12-03 DIAGNOSIS — M542 Cervicalgia: Secondary | ICD-10-CM | POA: Diagnosis present

## 2012-12-03 DIAGNOSIS — S88119A Complete traumatic amputation at level between knee and ankle, unspecified lower leg, initial encounter: Secondary | ICD-10-CM

## 2012-12-03 DIAGNOSIS — S78119A Complete traumatic amputation at level between unspecified hip and knee, initial encounter: Secondary | ICD-10-CM

## 2012-12-03 DIAGNOSIS — R109 Unspecified abdominal pain: Secondary | ICD-10-CM

## 2012-12-03 DIAGNOSIS — G8220 Paraplegia, unspecified: Secondary | ICD-10-CM | POA: Diagnosis present

## 2012-12-03 DIAGNOSIS — R10A1 Flank pain, right side: Secondary | ICD-10-CM

## 2012-12-03 DIAGNOSIS — K458 Other specified abdominal hernia without obstruction or gangrene: Secondary | ICD-10-CM | POA: Diagnosis present

## 2012-12-03 DIAGNOSIS — E440 Moderate protein-calorie malnutrition: Secondary | ICD-10-CM | POA: Diagnosis present

## 2012-12-03 DIAGNOSIS — Z6841 Body Mass Index (BMI) 40.0 and over, adult: Secondary | ICD-10-CM

## 2012-12-03 DIAGNOSIS — D638 Anemia in other chronic diseases classified elsewhere: Secondary | ICD-10-CM | POA: Diagnosis present

## 2012-12-03 DIAGNOSIS — L0291 Cutaneous abscess, unspecified: Secondary | ICD-10-CM

## 2012-12-03 DIAGNOSIS — R7402 Elevation of levels of lactic acid dehydrogenase (LDH): Secondary | ICD-10-CM | POA: Diagnosis present

## 2012-12-03 DIAGNOSIS — Z91199 Patient's noncompliance with other medical treatment and regimen due to unspecified reason: Secondary | ICD-10-CM

## 2012-12-03 DIAGNOSIS — R7401 Elevation of levels of liver transaminase levels: Secondary | ICD-10-CM | POA: Diagnosis present

## 2012-12-03 DIAGNOSIS — R10A0 Flank pain, unspecified side: Secondary | ICD-10-CM

## 2012-12-03 DIAGNOSIS — Z87891 Personal history of nicotine dependence: Secondary | ICD-10-CM

## 2012-12-03 DIAGNOSIS — E669 Obesity, unspecified: Secondary | ICD-10-CM

## 2012-12-03 DIAGNOSIS — N151 Renal and perinephric abscess: Principal | ICD-10-CM | POA: Diagnosis present

## 2012-12-03 DIAGNOSIS — N390 Urinary tract infection, site not specified: Secondary | ICD-10-CM

## 2012-12-03 DIAGNOSIS — R627 Adult failure to thrive: Secondary | ICD-10-CM | POA: Diagnosis present

## 2012-12-03 DIAGNOSIS — L089 Local infection of the skin and subcutaneous tissue, unspecified: Secondary | ICD-10-CM | POA: Diagnosis present

## 2012-12-03 DIAGNOSIS — Z9119 Patient's noncompliance with other medical treatment and regimen: Secondary | ICD-10-CM

## 2012-12-03 DIAGNOSIS — N200 Calculus of kidney: Secondary | ICD-10-CM

## 2012-12-03 LAB — URINALYSIS, ROUTINE W REFLEX MICROSCOPIC
Bilirubin Urine: NEGATIVE
Glucose, UA: NEGATIVE mg/dL
Specific Gravity, Urine: 1.012 (ref 1.005–1.030)
pH: 7 (ref 5.0–8.0)

## 2012-12-03 LAB — COMPREHENSIVE METABOLIC PANEL
ALT: 72 U/L — ABNORMAL HIGH (ref 0–35)
AST: 145 U/L — ABNORMAL HIGH (ref 0–37)
Albumin: 3.1 g/dL — ABNORMAL LOW (ref 3.5–5.2)
Alkaline Phosphatase: 125 U/L — ABNORMAL HIGH (ref 39–117)
Alkaline Phosphatase: 118 U/L — ABNORMAL HIGH (ref 39–117)
BUN: 9 mg/dL (ref 6–23)
Calcium: 8.8 mg/dL (ref 8.4–10.5)
Creatinine, Ser: 0.59 mg/dL (ref 0.50–1.10)
GFR calc Af Amer: >90 mL/min (ref >90)
GFR calc Af Amer: >90 mL/min (ref >90)
Glucose, Bld: 83 mg/dL (ref 70–99)
Glucose, Bld: 101 mg/dL — ABNORMAL HIGH (ref 70–99)
Potassium: 4.4 mEq/L (ref 3.5–5.1)
Sodium: 140 mEq/L (ref 135–145)
Total Protein: 7.6 g/dL (ref 6.0–8.3)
Total Protein: 7.1 g/dL (ref 6.0–8.3)

## 2012-12-03 LAB — CBC WITH DIFFERENTIAL/PLATELET
Eosinophils Absolute: 0.2 10*3/uL (ref 0.0–0.7)
Eosinophils Absolute: 0.2 10*3/uL (ref 0.0–0.7)
Eosinophils Relative: 3 % (ref 0–5)
Lymphs Abs: 1.6 10*3/uL (ref 0.7–4.0)
Lymphs Abs: 1.6 10*3/uL (ref 0.7–4.0)
MCH: 25.4 pg — ABNORMAL LOW (ref 26.0–34.0)
MCH: 25.4 pg — ABNORMAL LOW (ref 26.0–34.0)
MCV: 81.5 fL (ref 78.0–100.0)
Monocytes Relative: 6 % (ref 3–12)
Neutrophils Relative %: 52 % (ref 43–77)
Platelets: 475 10*3/uL — ABNORMAL HIGH (ref 150–400)
Platelets: 482 10*3/uL — ABNORMAL HIGH (ref 150–400)
RBC: 4.96 MIL/uL (ref 3.87–5.11)
RBC: 4.76 MIL/uL (ref 3.87–5.11)
WBC: 4.4 10*3/uL (ref 4.0–10.5)

## 2012-12-03 LAB — PHOSPHORUS: Phosphorus: 2.7 mg/dL (ref 2.3–4.6)

## 2012-12-03 LAB — APTT: aPTT: 42 seconds — ABNORMAL HIGH (ref 24–37)

## 2012-12-03 LAB — MAGNESIUM: Magnesium: 1.8 mg/dL (ref 1.5–2.5)

## 2012-12-03 MED ORDER — DEXTROSE 5 % IV SOLN
1.0000 g | INTRAVENOUS | Status: DC
  Administered 2012-12-04 – 2012-12-06 (×3): 1 g via INTRAVENOUS
  Filled 2012-12-03 (×4): qty 10

## 2012-12-03 MED ORDER — ONDANSETRON HCL 4 MG/2ML IJ SOLN
4.0000 mg | Freq: Four times a day (QID) | INTRAMUSCULAR | Status: DC | PRN
  Administered 2012-12-04 – 2012-12-08 (×6): 4 mg via INTRAVENOUS
  Filled 2012-12-03 (×6): qty 2

## 2012-12-03 MED ORDER — DEXTROSE 5 % IV SOLN: 1.0000 g | Freq: Once | INTRAVENOUS | Status: DC

## 2012-12-03 MED ORDER — ONDANSETRON HCL 4 MG PO TABS
4.0000 mg | ORAL_TABLET | Freq: Four times a day (QID) | ORAL | Status: DC | PRN
  Administered 2012-12-03: 4 mg via ORAL
  Filled 2012-12-03: qty 1

## 2012-12-03 MED ORDER — ACETAMINOPHEN 325 MG PO TABS: 650.0000 mg | ORAL_TABLET | Freq: Four times a day (QID) | ORAL | Status: DC | PRN

## 2012-12-03 MED ORDER — HYDROCODONE-ACETAMINOPHEN 5-325 MG PO TABS
1.0000 | ORAL_TABLET | ORAL | Status: DC | PRN
  Filled 2012-12-03: qty 2

## 2012-12-03 MED ORDER — SODIUM CHLORIDE 0.9 % IV SOLN
INTRAVENOUS | Status: AC
  Administered 2012-12-03: 19:00:00 via INTRAVENOUS

## 2012-12-03 MED ORDER — MORPHINE SULFATE 2 MG/ML IJ SOLN
1.0000 mg | INTRAMUSCULAR | Status: DC | PRN
  Administered 2012-12-03 – 2012-12-04 (×3): 1 mg via INTRAVENOUS
  Filled 2012-12-03 (×3): qty 1

## 2012-12-03 MED ORDER — SODIUM CHLORIDE 0.9 % IV BOLUS (SEPSIS)
1000.0000 mL | Freq: Once | INTRAVENOUS | Status: AC
  Administered 2012-12-03: 1000 mL via INTRAVENOUS

## 2012-12-03 MED ORDER — HYDRALAZINE HCL 20 MG/ML IJ SOLN
10.0000 mg | Freq: Three times a day (TID) | INTRAMUSCULAR | Status: DC
  Administered 2012-12-03 – 2012-12-09 (×11): 10 mg via INTRAVENOUS
  Filled 2012-12-03 (×22): qty 0.5

## 2012-12-03 MED ORDER — ACETAMINOPHEN 650 MG RE SUPP: 650.0000 mg | Freq: Four times a day (QID) | RECTAL | Status: DC | PRN
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
## 2012-12-03 NOTE — ED Notes (Addendum)
Per EMS: Pt was on home health and nurse reports that the patient has a "pocket above right kidney" that keeps getting infected and has had several laparoscopic procedures to drain. Pt/HH RN states that the area has been draining pus for over a week. Pt denies fever.  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 Foley catheter in place. Pt states that her urologist stated that they were concerned if urine was leaking into wound area and causing the swelling. Pt states she was scheduled for an MRI tomorrow for this.

## 2012-12-03 NOTE — Procedures (Signed)
Left DL brachial vein PICC placed . Length 53 cm. Tip SVC/RA junction. No immediate complications. Ok to use.
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
## 2012-12-03 NOTE — ED Notes (Signed)
Patient transported to IR 

## 2012-12-03 NOTE — ED Notes (Signed)
[id number]<AV> Expected date:<BR> Expected time:<BR> Means of arrival:<BR> Comments:<BR> Patient in IR, will return

## 2012-12-03 NOTE — ED Notes (Signed)
Pt returned from IR.

## 2012-12-04 ENCOUNTER — Other Ambulatory Visit: Payer: Managed Care, Other (non HMO)

## 2012-12-04 DIAGNOSIS — I10 Essential (primary) hypertension: Secondary | ICD-10-CM

## 2012-12-04 LAB — CBC
Platelets: 399 10*3/uL (ref 150–400)
RDW: 17.7 % — ABNORMAL HIGH (ref 11.5–15.5)
WBC: 5.5 10*3/uL (ref 4.0–10.5)

## 2012-12-04 LAB — URINE CULTURE: Colony Count: 95000

## 2012-12-04 LAB — COMPREHENSIVE METABOLIC PANEL
Alkaline Phosphatase: 109 U/L (ref 39–117)
BUN: 11 mg/dL (ref 6–23)
CO2: 27 mEq/L (ref 19–32)
Chloride: 107 mEq/L (ref 96–112)
GFR calc Af Amer: >90 mL/min (ref >90)
Glucose, Bld: 103 mg/dL — ABNORMAL HIGH (ref 70–99)
Potassium: 4 mEq/L (ref 3.5–5.1)
Total Bilirubin: 0.4 mg/dL (ref 0.3–1.2)

## 2012-12-04 MED ORDER — SENNOSIDES-DOCUSATE SODIUM 8.6-50 MG PO TABS
1.0000 | ORAL_TABLET | Freq: Two times a day (BID) | ORAL | Status: DC
  Filled 2012-12-04 (×12): qty 1

## 2012-12-04 MED ORDER — HYDROMORPHONE HCL PF 1 MG/ML IJ SOLN
1.0000 mg | INTRAMUSCULAR | Status: DC | PRN
  Administered 2012-12-04 – 2012-12-09 (×16): 1 mg via INTRAVENOUS
  Filled 2012-12-04 (×17): qty 1

## 2012-12-04 MED ORDER — LORAZEPAM 1 MG PO TABS
1.0000 mg | ORAL_TABLET | Freq: Once | ORAL | Status: AC
  Administered 2012-12-05: 1 mg via ORAL
  Filled 2012-12-04: qty 1

## 2012-12-04 MED ORDER — ENOXAPARIN SODIUM 30 MG/0.3ML ~~LOC~~ SOLN
30.0000 mg | SUBCUTANEOUS | Status: DC
  Administered 2012-12-04 – 2012-12-05 (×2): 30 mg via SUBCUTANEOUS
  Filled 2012-12-04 (×3): qty 0.3

## 2012-12-04 MED ORDER — POLYETHYLENE GLYCOL 3350 17 G PO PACK
17.0000 g | PACK | Freq: Every day | ORAL | Status: DC
  Filled 2012-12-04 (×6): qty 1

## 2012-12-04 MED ORDER — DIAZEPAM 5 MG PO TABS: 10.0000 mg | ORAL_TABLET | Freq: Once | ORAL | Status: AC | PRN

## 2012-12-04 MED ORDER — DIAZEPAM 5 MG PO TABS: 10.0000 mg | ORAL_TABLET | Freq: Once | ORAL | Status: DC | PRN

## 2012-12-04 NOTE — Progress Notes (Addendum)
Patient ID: [name], female   DOB: 04/03/1961, 52 y.o.   MRN: [id number] [facility] PROGRESS NOTE  TRACYANN DUFFELL NFA:[id number] DOB: 01-04-1961 DOA: 12/03/2012 PCP: User Centricity, MD  Brief narrative:  52 year old female with past medical history of paraplegia, bilateral above knee amputation, chronic indwelling urethral catheter, apparently non compliant with mediations who presented to [facility] ED 12/03/2012 with worsening right flank pain as well as abscess formation at the site of nephrostomy tube. Patient reported having 10/10 pain in the right flank area not relieved with home analgesics. No associated fevers or chills. She does report nausea but no vomiting. No reports of chest pain, no shortness of breath and no palpitations.  In ED, vital signs are as follows: BP 188/84, HR 60, Tmax 98.5 F. Her CBC and BMP were essentially unremarkable. Abdominal US showed right flank fluid collection significantly smaller than that seen on the recent CT scan due to recent drainage.   Assessment and Plan:  Principal Problem:  Right flank pain  - will follow up on MRI findings  - follow up urine culture results  - continue rocephin IV Q 24 hours day #2 - pain management with dilaudid  - continue IV fluids and antiemetics  - appreciate urology consultation and recommendations  Active Problems:  HTN (hypertension)  - started hydralazine 10 mg IV Q 8 hours  - will monitor closely as BP this AM on soft side  Transaminitis - unclear etiology, ? Alcohol use - will monitor   Consultants:  Urology   Procedures/Studies:  US Abdomen Limited 12/03/2012   Right flank fluid collection is significantly smaller than that seen on the recent CT scan due to recent drainage.    Antibiotics:  Rocephin 06/13 -->   Code Status: Full Family Communication: Pt at bedside Disposition Plan: Home when medically stable  HPI/Subjective: No events overnight.   Objective: Filed Vitals:   12/03/12 2119  12/03/12 2158 12/04/12 0231 12/04/12 0515  BP: 164/80 176/73 123/65 106/73  Pulse: 81   92  Temp: 97.4 F (36.3 C)   97.7 F (36.5 C)  TempSrc: Oral   Oral  Resp: 20   16  Height:      Weight:      SpO2: 100%   98%    Intake/Output Summary (Last 24 hours) at 12/04/12 0727 Last data filed at 12/04/12 0604  Gross per 24 hour  Intake    480 ml  Output   1050 ml  Net   -570 ml    Exam:   General:  Pt is alert, follows commands appropriately, not in acute distress  Cardiovascular: Regular rate and rhythm, S1/S2, no murmurs, no rubs, no gallops  Respiratory: Clear to auscultation bilaterally, no wheezing, no crackles, no rhonchi  Abdomen: Soft, non tender, non distended, bowel sounds present, no guarding  Neuro: Grossly nonfocal  Data Reviewed: Basic Metabolic Panel:  Recent Labs Lab 12/03/12 1421 12/03/12 1930 12/04/12 0520  NA 140 141 140  K 4.4 3.7 4.0  CL 105 107 107  CO2 28 26 27   GLUCOSE 83 101* 103*  BUN 10 9 11   CREATININE 0.69 0.59 0.69  CALCIUM 9.3 8.8 8.7  MG  --  1.8  --   PHOS  --  2.7  --    Liver Function Tests:  Recent Labs Lab 12/03/12 1421 12/03/12 1930 12/04/12 0520  AST 145* 164* 152*  ALT 72* 75* 73*  ALKPHOS 125* 118* 109  BILITOT 0.4 0.4  0.4  PROT 7.6 7.1 6.4  ALBUMIN 3.1* 2.9* 2.7*    Recent Labs Lab 12/03/12 1421  LIPASE 33  CBC:  Recent Labs Lab 12/03/12 1421 12/03/12 1930 12/04/12 0520  WBC 4.4 5.7 5.5  NEUTROABS 2.3 3.6  --   HGB 12.6 12.1 11.0*  HCT 41.0 38.8 36.7  MCV 82.7 81.5 81.7  PLT 475* 482* 399    Scheduled Meds: . cefTRIAXone (ROCEPHIN)  IV  1 g Intravenous Q24H  . hydrALAZINE  10 mg Intravenous Q8H   Continuous Infusions:    Debbora Presto, MD  [facility] Pager [phone]  If 7PM-7AM, please contact night-coverage [email or web address] Password [email or web address] 12/04/2012, 7:27 AM   LOS: 1 day

## 2012-12-05 ENCOUNTER — Inpatient Hospital Stay (HOSPITAL_COMMUNITY): Payer: Managed Care, Other (non HMO)

## 2012-12-05 LAB — COMPREHENSIVE METABOLIC PANEL
ALT: 83 U/L — ABNORMAL HIGH (ref 0–35)
AST: 154 U/L — ABNORMAL HIGH (ref 0–37)
Alkaline Phosphatase: 102 U/L (ref 39–117)
CO2: 26 mEq/L (ref 19–32)
GFR calc Af Amer: >90 mL/min (ref >90)
Glucose, Bld: 101 mg/dL — ABNORMAL HIGH (ref 70–99)
Potassium: 3.8 mEq/L (ref 3.5–5.1)
Sodium: 140 mEq/L (ref 135–145)
Total Protein: 6.1 g/dL (ref 6.0–8.3)

## 2012-12-05 LAB — CBC
Hemoglobin: 10.6 g/dL — ABNORMAL LOW (ref 12.0–15.0)
MCHC: 29.4 g/dL — ABNORMAL LOW (ref 30.0–36.0)
Platelets: 344 10*3/uL (ref 150–400)
RBC: 4.33 MIL/uL (ref 3.87–5.11)

## 2012-12-05 MED ORDER — ALTEPLASE 2 MG IJ SOLR
2.0000 mg | Freq: Once | INTRAMUSCULAR | Status: AC
  Administered 2012-12-05: 2 mg
  Filled 2012-12-05 (×2): qty 2

## 2012-12-05 MED ORDER — GADOBENATE DIMEGLUMINE 529 MG/ML IV SOLN
20.0000 mL | Freq: Once | INTRAVENOUS | Status: AC | PRN
  Administered 2012-12-05: 20 mL via INTRAVENOUS

## 2012-12-05 MED ORDER — DIAZEPAM 5 MG PO TABS
10.0000 mg | ORAL_TABLET | Freq: Once | ORAL | Status: AC
  Administered 2012-12-05: 10 mg via ORAL

## 2012-12-05 MED ORDER — DIAZEPAM 5 MG PO TABS
10.0000 mg | ORAL_TABLET | Freq: Once | ORAL | Status: AC | PRN
  Filled 2012-12-05: qty 2

## 2012-12-05 NOTE — Progress Notes (Signed)
Patient ID: [name], female   DOB: 02/23/1961, 52 y.o.   MRN: [id number] [facility] PROGRESS NOTE  KHIANA CAMINO ION:[id number] DOB: Nov 27, 1960 DOA: 12/03/2012 PCP: User Centricity, MD  Brief narrative:  52 year old female with past medical history of paraplegia, bilateral above knee amputation, chronic indwelling urethral catheter, apparently non compliant with mediations who presented to [facility] ED 12/03/2012 with worsening right flank pain as well as abscess formation at the site of nephrostomy tube. Patient reported having 10/10 pain in the right flank area not relieved with home analgesics. No associated fevers or chills. She does report nausea but no vomiting. No reports of chest pain, no shortness of breath and no palpitations.  In ED, vital signs are as follows: BP 188/84, HR 60, Tmax 98.5 F. Her CBC and BMP were essentially unremarkable. Abdominal US showed right flank fluid collection significantly smaller than that seen on the recent CT scan due to recent drainage.  Assessment and Plan:  Principal Problem:  Right flank pain  - will follow up on MRI findings, MRI is scheduled for today at 11 am  - follow up urine culture results  - continue rocephin IV Q 24 hours day #3 - pain management with dilaudid  - appreciate urology consultation and recommendations  Active Problems:  HTN (hypertension)  - continue hydralazine 10 mg IV Q 8 hours  Transaminitis  - unclear etiology, ? Alcohol use  - will monitor  Moderate malnutrition, morbid obesity, BMI > 40  - secondary to progressive failure to thrive, pt is paraplegic - nutritionist consultation ordered   Consultants:  Urology  Procedures/Studies:  US Abdomen Limited 12/03/2012 Right flank fluid collection is significantly smaller than that seen on the recent CT scan due to recent drainage.  Antibiotics:  Rocephin 06/13 -->   Code Status: Full  Family Communication: Pt at bedside  Disposition Plan: Home when medically stable    HPI/Subjective: No events overnight.   Objective: Filed Vitals:   12/04/12 1405 12/04/12 1611 12/04/12 2132 12/05/12 0503  BP: 156/70 152/74 121/62 130/63  Pulse: 69  85 94  Temp: 97.4 F (36.3 C)  98.6 F (37 C) 99.1 F (37.3 C)  TempSrc: Oral  Oral Oral  Resp: 20  18 18   Height:      Weight:      SpO2: 100%  96% 95%    Intake/Output Summary (Last 24 hours) at 12/05/12 2440 Last data filed at 12/04/12 1814  Gross per 24 hour  Intake    840 ml  Output    350 ml  Net    490 ml    Exam:   General:  Pt is alert, follows commands appropriately, not in acute distress  Cardiovascular: Regular rate and rhythm, S1/S2, no murmurs, no rubs, no gallops  Respiratory: Clear to auscultation bilaterally, no wheezing, no crackles, no rhonchi  Abdomen: Soft, tender in suprapubic area, non distended, bowel sounds present, no guarding  Data Reviewed: Basic Metabolic Panel:  Recent Labs Lab 12/03/12 1421 12/03/12 1930 12/04/12 0520  NA 140 141 140  K 4.4 3.7 4.0  CL 105 107 107  CO2 28 26 27   GLUCOSE 83 101* 103*  BUN 10 9 11   CREATININE 0.69 0.59 0.69  CALCIUM 9.3 8.8 8.7  MG  --  1.8  --   PHOS  --  2.7  --    Liver Function Tests:  Recent Labs Lab 12/03/12 1421 12/03/12 1930 12/04/12 0520  AST 145* 164* 152*  ALT 72* 75* 73*  ALKPHOS 125* 118* 109  BILITOT 0.4 0.4 0.4  PROT 7.6 7.1 6.4  ALBUMIN 3.1* 2.9* 2.7*    Recent Labs Lab 12/03/12 1421  LIPASE 33   CBC:  Recent Labs Lab 12/03/12 1421 12/03/12 1930 12/04/12 0520  WBC 4.4 5.7 5.5  NEUTROABS 2.3 3.6  --   HGB 12.6 12.1 11.0*  HCT 41.0 38.8 36.7  MCV 82.7 81.5 81.7  PLT 475* 482* 399    Recent Results (from the past 240 hour(s))  URINE CULTURE     Status: None   Collection Time    12/03/12  2:16 PM      Result Value Range Status   Specimen Description URINE, CLEAN CATCH   Final   Special Requests NONE   Final   Culture  Setup Time 12/03/2012 22:41   Final   Colony Count 95,000  COLONIES/ML   Final   Culture     Final   Value: Multiple bacterial morphotypes present, none predominant. Suggest appropriate recollection if clinically indicated.   Report Status 12/04/2012 FINAL   Final     Scheduled Meds: . cefTRIAXone (ROCEPHIN)  IV  1 g Intravenous Q24H  . enoxaparin (LOVENOX) injection  30 mg Subcutaneous Q24H  . hydrALAZINE  10 mg Intravenous Q8H  . polyethylene glycol  17 g Oral Daily  . senna-docusate  1 tablet Oral BID   Continuous Infusions:    Debbora Presto, MD  [facility] Pager [phone]  If 7PM-7AM, please contact night-coverage [email or web address] Password [email or web address] 12/05/2012, 6:25 AM   LOS: 2 days

## 2012-12-05 NOTE — Progress Notes (Signed)
12/05/12 0811 Patient refused finger stick for blood sugar check. On today's morning labs at 0600 glucose level was 101.

## 2012-12-06 ENCOUNTER — Inpatient Hospital Stay (HOSPITAL_COMMUNITY): Payer: Managed Care, Other (non HMO) | Admitting: Anesthesiology

## 2012-12-06 ENCOUNTER — Ambulatory Visit: Payer: Managed Care, Other (non HMO) | Admitting: Internal Medicine

## 2012-12-06 ENCOUNTER — Encounter (HOSPITAL_COMMUNITY): Payer: Self-pay | Admitting: Anesthesiology

## 2012-12-06 ENCOUNTER — Encounter (HOSPITAL_COMMUNITY)
Admission: EM | Disposition: A | Discharge status: Home Health | Payer: Self-pay | Source: Home / Self Care | Attending: Internal Medicine

## 2012-12-06 ENCOUNTER — Encounter (HOSPITAL_COMMUNITY): Payer: Self-pay

## 2012-12-06 ENCOUNTER — Inpatient Hospital Stay (HOSPITAL_COMMUNITY): Payer: Managed Care, Other (non HMO)

## 2012-12-06 ENCOUNTER — Other Ambulatory Visit: Payer: Self-pay | Admitting: Urology

## 2012-12-06 HISTORY — PX: CYSTOSCOPY W/ URETERAL STENT PLACEMENT: SHX1429

## 2012-12-06 LAB — BASIC METABOLIC PANEL
Calcium: 8.7 mg/dL (ref 8.4–10.5)
Creatinine, Ser: 0.74 mg/dL (ref 0.50–1.10)
GFR calc Af Amer: >90 mL/min (ref >90)
GFR calc non Af Amer: >90 mL/min (ref >90)

## 2012-12-06 LAB — CBC
MCH: 25.7 pg — ABNORMAL LOW (ref 26.0–34.0)
MCV: 83.9 fL (ref 78.0–100.0)
Platelets: 354 10*3/uL (ref 150–400)
RDW: 18.7 % — ABNORMAL HIGH (ref 11.5–15.5)
WBC: 5.7 10*3/uL (ref 4.0–10.5)

## 2012-12-06 SURGERY — CYSTOSCOPY, WITH RETROGRADE PYELOGRAM AND URETERAL STENT INSERTION
Anesthesia: General | Laterality: Right | Wound class: Clean Contaminated

## 2012-12-06 MED ORDER — FENTANYL CITRATE 0.05 MG/ML IJ SOLN
INTRAMUSCULAR | Status: DC | PRN
  Administered 2012-12-06 (×2): 50 ug via INTRAVENOUS

## 2012-12-06 MED ORDER — PROPOFOL 10 MG/ML IV BOLUS
INTRAVENOUS | Status: DC | PRN
  Administered 2012-12-06: 150 mg via INTRAVENOUS

## 2012-12-06 MED ORDER — MIDAZOLAM HCL 5 MG/5ML IJ SOLN
INTRAMUSCULAR | Status: DC | PRN
  Administered 2012-12-06: 2 mg via INTRAVENOUS

## 2012-12-06 MED ORDER — STERILE WATER FOR IRRIGATION IR SOLN
Status: DC | PRN
  Administered 2012-12-06: 500 mL

## 2012-12-06 MED ORDER — LACTATED RINGERS IV SOLN: INTRAVENOUS | Status: DC

## 2012-12-06 MED ORDER — PROMETHAZINE HCL 25 MG/ML IJ SOLN: 6.2500 mg | INTRAMUSCULAR | Status: DC | PRN

## 2012-12-06 MED ORDER — ENOXAPARIN SODIUM 60 MG/0.6ML ~~LOC~~ SOLN
60.0000 mg | SUBCUTANEOUS | Status: DC
  Administered 2012-12-07 – 2012-12-09 (×3): 60 mg via SUBCUTANEOUS
  Filled 2012-12-06 (×4): qty 0.6

## 2012-12-06 MED ORDER — LACTATED RINGERS IV SOLN
INTRAVENOUS | Status: DC | PRN
  Administered 2012-12-06: 14:00:00 via INTRAVENOUS

## 2012-12-06 MED ORDER — LIDOCAINE HCL 1 % IJ SOLN
INTRAMUSCULAR | Status: DC | PRN
  Administered 2012-12-06: 40 mg via INTRADERMAL

## 2012-12-06 MED ORDER — FENTANYL CITRATE 0.05 MG/ML IJ SOLN: 25.0000 ug | INTRAMUSCULAR | Status: DC | PRN

## 2012-12-06 MED ORDER — LACTATED RINGERS IV SOLN
INTRAVENOUS | Status: DC
  Administered 2012-12-06: 1000 mL via INTRAVENOUS

## 2012-12-06 MED ORDER — MEPERIDINE HCL 50 MG/ML IJ SOLN: 6.2500 mg | INTRAMUSCULAR | Status: DC | PRN

## 2012-12-06 MED ORDER — ONDANSETRON HCL 4 MG/2ML IJ SOLN
INTRAMUSCULAR | Status: DC | PRN
  Administered 2012-12-06: 4 mg via INTRAVENOUS

## 2012-12-06 MED ORDER — IOHEXOL 300 MG/ML  SOLN
INTRAMUSCULAR | Status: DC | PRN
  Administered 2012-12-06: 10 mL

## 2012-12-06 SURGICAL SUPPLY — 21 items
ADAPTER CATH URET PLST 4-6FR (CATHETERS) ×1 IMPLANT
ADPR CATH URET STRL DISP 4-6FR (CATHETERS)
BAG URINE DRAINAGE (UROLOGICAL SUPPLIES) ×1 IMPLANT
BAG URO CATCHER STRL LF (DRAPE) ×2 IMPLANT
CATH FOLEY 2WAY SLVR  5CC 18FR (CATHETERS) ×1
CATH FOLEY 2WAY SLVR 5CC 18FR (CATHETERS) IMPLANT
CATH INTERMIT  6FR 70CM (CATHETERS) ×1 IMPLANT
CATH URET 5FR 28IN OPEN ENDED (CATHETERS) ×1 IMPLANT
CLOTH BEACON ORANGE TIMEOUT ST (SAFETY) ×2 IMPLANT
DRAPE CAMERA CLOSED 9X96 (DRAPES) ×2 IMPLANT
GLOVE BIOGEL M 7.0 STRL (GLOVE) ×2 IMPLANT
GOWN PREVENTION PLUS XLARGE (GOWN DISPOSABLE) ×2 IMPLANT
GOWN STRL NON-REIN LRG LVL3 (GOWN DISPOSABLE) ×2 IMPLANT
GUIDEWIRE STR DUAL SENSOR (WIRE) ×2 IMPLANT
MANIFOLD NEPTUNE II (INSTRUMENTS) ×2 IMPLANT
PACK CYSTO (CUSTOM PROCEDURE TRAY) ×2 IMPLANT
SCRUB PCMX 4 OZ (MISCELLANEOUS) ×2 IMPLANT
STENT CONTOUR 6FRX24X.038 (STENTS) ×1 IMPLANT
SYR CONTROL 10ML LL (SYRINGE) ×1 IMPLANT
SYRINGE 10CC LL (SYRINGE) ×1 IMPLANT
TUBING CONNECTING 10 (TUBING) ×1 IMPLANT
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
## 2012-12-06 NOTE — Anesthesia Preprocedure Evaluation (Signed)
Anesthesia Evaluation  Patient identified by MRN, date of birth, ID band Patient awake  General Assessment Comment:Difficult IV access  Reviewed: Allergy & Precautions, H&P , NPO status , Patient's Chart, lab work & pertinent test results, reviewed documented beta blocker date and time   Airway Mallampati: II  Neck ROM: Full    Dental  (+) Teeth Intact and Dental Advisory Given   Pulmonary neg pulmonary ROS,  breath sounds clear to auscultation        Cardiovascular hypertension, Pt. on medications Rhythm:Regular Rate:Normal  Denies cardiac symptoms   Neuro/Psych T5 paraplegia negative psych ROS   GI/Hepatic negative GI ROS, Neg liver ROS,   Endo/Other  Morbid obesity  Renal/GU Kidney stone  negative genitourinary   Musculoskeletal negative musculoskeletal ROS (+)   Abdominal   Peds negative pediatric ROS (+)  Hematology negative hematology ROS (+)   Anesthesia Other Findings   Reproductive/Obstetrics negative OB ROS                           Anesthesia Physical  Anesthesia Plan  ASA: III  Anesthesia Plan: General   Post-op Pain Management:    Induction: Intravenous  Airway Management Planned: LMA  Additional Equipment:   Intra-op Plan:   Post-operative Plan:   Informed Consent: I have reviewed the patients History and Physical, chart, labs and discussed the procedure including the risks, benefits and alternatives for the proposed anesthesia with the patient or authorized representative who has indicated his/her understanding and acceptance.   Dental advisory given  Plan Discussed with: CRNA and Surgeon  Anesthesia Plan Comments:         Anesthesia Quick Evaluation

## 2012-12-06 NOTE — Progress Notes (Addendum)
Patient ID: [name], female   DOB: [birth year], 52 y.o.   MRN: [id number] [facility] PROGRESS NOTE  MINTIE WITHERINGTON NWG:[id number] DOB: August 09, 1960 DOA: 12/03/2012 PCP: User Centricity, MD  Brief narrative: 52 year old female with past medical history of paraplegia, bilateral above knee amputation, chronic indwelling urethral catheter, apparently non compliant with mediations who presented to [facility] ED 12/03/2012 with worsening right flank pain as well as abscess formation at the site of nephrostomy tube. Patient reported having 10/10 pain in the right flank area not relieved with home analgesics. No associated fevers or chills. She does report nausea but no vomiting. No reports of chest pain, no shortness of breath and no palpitations.   In ED, vital signs are as follows: BP 188/84, HR 60, Tmax 98.5 F. Her CBC and BMP were essentially unremarkable. Abdominal US showed right flank fluid collection significantly smaller than that seen on the recent CT scan due to recent drainage.   Assessment and Plan:  Principal Problem:  Right flank pain  - MRI findings consistent with resolution of previously seen right posterior abdominal wall fluid collection, nephro cutaneous fistula noted as well - Plan per urology is to take patient to OR for cystoscopy with right retrograde pyelogram, right ureter stent placement - We'll continue to keep n.p.o. except medications - Urine culture with multiple morphotypes present, patient still with low-grade fever of 99.5 F - Continue Rocephin IV every 24 hours day #4  - pain management with dilaudid  - appreciate urology consultation and recommendations  Active Problems:  HTN (hypertension)  - continue hydralazine 10 mg IV Q 8 hours  - Reasonable inpatient control on hydralazine Transaminitis  - unclear etiology, stable but elevated AST and ALT Moderate malnutrition, morbid obesity, BMI > 40  - secondary to progressive failure to thrive, pt is paraplegic  -  nutritionist consultation ordered   Consultants:  Urology  Procedures/Studies:   Mr Abdomen W Wo Contrast 12/05/2012   1.  Resolution of previously seen right posterior abdominal wall fluid collection.  2.  Persistent thin fluid filled tract extending from the posterior right kidney to the posterior abdominal wall skin surface.  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  3.  Stable large right lumbar hernia containing descending colon.    US Abdomen Limited 12/03/2012  1.  Right flank fluid collection is significantly smaller than that seen on the recent CT scan due to recent drainage.  Antibiotics:  Rocephin 06/13 -->   Code Status: Full  Family Communication: Pt at bedside  Disposition Plan: Home when medically stable   HPI/Subjective: No events overnight.   Objective: Filed Vitals:   12/05/12 0503 12/05/12 1300 12/05/12 2017 12/06/12 0540  BP: 130/63 104/58 135/61 120/54  Pulse: 94 92 100 103  Temp: 99.1 F (37.3 C)  98.8 F (37.1 C) 98.4 F (36.9 C)  TempSrc: Oral  Oral Oral  Resp: 18  16 16   Height:      Weight:      SpO2: 95%  95% 98%    Intake/Output Summary (Last 24 hours) at 12/06/12 1135 Last data filed at 12/06/12 0540  Gross per 24 hour  Intake    770 ml  Output    550 ml  Net    220 ml    Exam:   General:  Pt is alert, follows commands appropriately, not in acute distress  Cardiovascular: Regular rate and rhythm, S1/S2, no murmurs, no rubs, no gallops  Respiratory: Clear to auscultation bilaterally, no wheezing, decreased breath sounds at bases  Abdomen: Soft, non tender, non distended, bowel sounds present, no guarding  Neuro: Grossly nonfocal  Data Reviewed: Basic Metabolic Panel:  Recent Labs Lab 12/03/12 1421 12/03/12 1930 12/04/12 0520 12/05/12 0600 12/06/12 0800  NA 140 141 140 140 137  K 4.4 3.7 4.0 3.8 4.1  CL 105 107 107 107 106  CO2 28 26 27 26 25   GLUCOSE 83 101* 103* 101* 98   BUN 10 9 11 12 12   CREATININE 0.69 0.59 0.69 0.78 0.74  CALCIUM 9.3 8.8 8.7 8.7 8.7  MG  --  1.8  --   --   --   PHOS  --  2.7  --   --   --    Liver Function Tests:  Recent Labs Lab 12/03/12 1421 12/03/12 1930 12/04/12 0520 12/05/12 0600  AST 145* 164* 152* 154*  ALT 72* 75* 73* 83*  ALKPHOS 125* 118* 109 102  BILITOT 0.4 0.4 0.4 0.3  PROT 7.6 7.1 6.4 6.1  ALBUMIN 3.1* 2.9* 2.7* 2.6*    Recent Labs Lab 12/03/12 1421  LIPASE 33   CBC:  Recent Labs Lab 12/03/12 1421 12/03/12 1930 12/04/12 0520 12/05/12 0600 12/06/12 0800  WBC 4.4 5.7 5.5 4.6 5.7  NEUTROABS 2.3 3.6  --   --   --   HGB 12.6 12.1 11.0* 10.6* 11.0*  HCT 41.0 38.8 36.7 36.0 35.9*  MCV 82.7 81.5 81.7 83.1 83.9  PLT 475* 482* 399 344 354    Recent Results (from the past 240 hour(s))  URINE CULTURE     Status: None   Collection Time    12/03/12  2:16 PM      Result Value Range Status   Specimen Description URINE, CLEAN CATCH   Final   Special Requests NONE   Final   Culture  Setup Time 12/03/2012 22:41   Final   Colony Count 95,000 COLONIES/ML   Final   Culture     Final   Value: Multiple bacterial morphotypes present, none predominant. Suggest appropriate recollection if clinically indicated.   Report Status 12/04/2012 FINAL   Final     Scheduled Meds: . cefTRIAXone  IV  1 g Intravenous Q24H  . enoxaparin  injection  30 mg Subcutaneous Q24H  . hydrALAZINE  10 mg Intravenous Q8H  . polyethylene glycol  17 g Oral Daily  . senna-docusate  1 tablet Oral BID   Continuous Infusions:  Debbora Presto, MD  [facility] Pager [phone]  If 7PM-7AM, please contact night-coverage [email or web address] Password [facility] 12/06/2012, 11:35 AM   LOS: 3 days
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
## 2012-12-06 NOTE — Anesthesia Postprocedure Evaluation (Signed)
  Anesthesia Post-op Note  Patient: [name]  Procedure(s) Performed: Procedure(s) (LRB): CYSTOSCOPY WITH RETROGRADE PYELOGRAM/URETERAL STENT PLACEMENT (Right)  Patient Location: PACU  Anesthesia Type: General  Level of Consciousness: awake and alert   Airway and Oxygen Therapy: Patient Spontanous Breathing  Post-op Pain: mild  Post-op Assessment: Post-op Vital signs reviewed, Patient's Cardiovascular Status Stable, Respiratory Function Stable, Patent Airway and No signs of Nausea or vomiting  Last Vitals:  Filed Vitals:   12/06/12 1615  BP: 127/71  Pulse: 64  Temp: 36.7 C  Resp: 16    Post-op Vital Signs: stable   Complications: No apparent anesthesia complications

## 2012-12-06 NOTE — Transfer of Care (Signed)
Immediate Anesthesia Transfer of Care Note  Patient: [name]  Procedure(s) Performed: Procedure(s): CYSTOSCOPY WITH RETROGRADE PYELOGRAM/URETERAL STENT PLACEMENT (Right)  Patient Location: PACU  Anesthesia Type:General  Level of Consciousness: awake, alert , oriented and patient cooperative  Airway & Oxygen Therapy: Patient Spontanous Breathing and Patient connected to face mask oxygen  Post-op Assessment: Report given to PACU RN and Post -op Vital signs reviewed and stable  Post vital signs: Reviewed and stable  Complications: No apparent anesthesia complications

## 2012-12-06 NOTE — Op Note (Signed)
Urology Operative Report  Date of Procedure: 12/06/12  Surgeon: Natalia Leatherwood, MD Assistant:  None  Preoperative Diagnosis: Right perinephric abscess Postoperative Diagnosis:  Same  Procedure(s): Cystoscopy Rightt retrograde pyelogram Right ureter stent placement  Estimated blood loss: None  Specimen: None  Drains: Foley catheter  Complications: None  Findings: Negative extravasation of urine on right retrograde pyelogram. Negative hydronephrosis or hydroureter.  History of present illness: 52 year old female presented to the ER with a right perinephric abscess extending to her skin. She had a history of percutaneous renal surgery one year ago for stones. 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 I recommended going to the operating room to see if this was the case and for stent placement on the right side.   Procedure in detail: After informed consent was obtained, the patient was taken to the operating room. They were placed in the supine position. SCDs were turned on and in place. IV antibiotics were infused, and general anesthesia was induced. A timeout was performed in which the correct patient, surgical site, and procedure were identified and agreed upon by the team.  The patient was placed in a dorsolithotomy position, making sure to pad all pertinent neurovascular pressure points. Her Foley catheter was removed. The genitals were prepped and draped in the usual sterile fashion.  I advanced a rigid cystoscope into the bladder. Attention was turned to the right ureter orifice. This was cannulated with a 5 [place] ureter catheter. I injected contrast to obtain a retrograde pyelogram. I purposefully distended the urinary system and did not see any evidence of extravasation of contrast from her kidney. There was no hydronephrosis and no obstruction of the ureter.  I elected to go ahead and place a right ureter stent to ensure maximum drainage from her kidney. I placed  a sensor wire up the right ureter orifice and into the right renal pelvis. I then placed a 6 x 24 double-J ureter stent over this without the tethering strings intact. This was deployed with a good curl in her right renal pelvis on fluoroscopy and a good curl in her bladder. I then removed the cystoscope and placed an 18 French Foley catheter with 10 cc of water into the balloon.  He was placed back in a supine position, anesthesia was reversed, she was taken to the [facility] in stable condition. She'll be readmitted to the hospital floor.  All counts were correct at the end of the case.

## 2012-12-06 NOTE — Progress Notes (Signed)
Performed sterile  Dressing change to pt rt flank , pt tolerated well.  No problem reported at this time,

## 2012-12-06 NOTE — Progress Notes (Signed)
[facility]  Patient Status: Active (receiving services up to time of hospitalization)  [facility] is providing the following services: RN  If patient discharges after hours, please call [phone].   [facility] 12/06/2012, 11:05 AM

## 2012-12-07 ENCOUNTER — Encounter (HOSPITAL_COMMUNITY): Payer: Self-pay | Admitting: Urology

## 2012-12-07 DIAGNOSIS — N151 Renal and perinephric abscess: Principal | ICD-10-CM

## 2012-12-07 LAB — BASIC METABOLIC PANEL
BUN: 11 mg/dL (ref 6–23)
CO2: 26 mEq/L (ref 19–32)
Chloride: 106 mEq/L (ref 96–112)
GFR calc non Af Amer: >90 mL/min (ref >90)
Glucose, Bld: 97 mg/dL (ref 70–99)
Potassium: 3.7 mEq/L (ref 3.5–5.1)
Sodium: 138 mEq/L (ref 135–145)

## 2012-12-07 LAB — CBC
HCT: 35.1 % — ABNORMAL LOW (ref 36.0–46.0)
Hemoglobin: 10.4 g/dL — ABNORMAL LOW (ref 12.0–15.0)
RBC: 4.16 MIL/uL (ref 3.87–5.11)
WBC: 5.4 10*3/uL (ref 4.0–10.5)

## 2012-12-07 MED ORDER — AMPICILLIN-SULBACTAM SODIUM 3 (2-1) G IJ SOLR
3.0000 g | Freq: Four times a day (QID) | INTRAMUSCULAR | Status: DC
  Administered 2012-12-07 – 2012-12-09 (×8): 3 g via INTRAVENOUS
  Filled 2012-12-07 (×9): qty 3

## 2012-12-07 MED ORDER — PROMETHAZINE HCL 25 MG/ML IJ SOLN
12.5000 mg | Freq: Four times a day (QID) | INTRAMUSCULAR | Status: DC | PRN
  Administered 2012-12-07: 12.5 mg via INTRAVENOUS
  Filled 2012-12-07: qty 1

## 2012-12-07 MED FILL — Belladonna Alkaloids & Opium Suppos 16.2-60 MG: RECTAL | Qty: 1 | Status: AC

## 2012-12-07 NOTE — Progress Notes (Signed)
Patient ID: [name], female   DOB: 1960-08-08, 52 y.o.   MRN: [id number] [facility] PROGRESS NOTE  [name] NWG:[id number] DOB: 12-04-1960 DOA: 12/03/2012 PCP: User Centricity, MD  Brief narrative:  52 year old female with past medical history of paraplegia, bilateral above knee amputation, chronic indwelling urethral catheter, apparently non compliant with mediations who presented to [facility] ED 12/03/2012 with worsening right flank pain as well as abscess formation at the site of nephrostomy tube. Patient reported having 10/10 pain in the right flank area not relieved with home analgesics. No associated fevers or chills. She does report nausea but no vomiting. No reports of chest pain, no shortness of breath and no palpitations.  In ED, vital signs are as follows: BP 188/84, HR 60, Tmax 98.5 F. Her CBC and BMP were essentially unremarkable. Abdominal US showed right flank fluid collection significantly smaller than that seen on the recent CT scan due to recent drainage.  Assessment and Plan:  Principal Problem:  Right flank pain  - MRI findings consistent with resolution of previously seen right posterior abdominal wall fluid collection, nephro cutaneous fistula noted as well  - pt is status post Cystoscopy, Right retrograde pyelogram, Right ureter stent placement 12/06/2012, post op day #1, clinically stable  - no evidence of urine leak - recommendation per urology - need ID consultation to determined adequate ABX coverage, will follow upon ID recommendations  - pain management with dilaudid  - appreciate urology consultation and recommendations  Active Problems:  HTN (hypertension)  - continue hydralazine 10 mg IV Q 8 hours  - Reasonable inpatient control on hydralazine  Transaminitis  - unclear etiology, CMET stable but elevated AST and ALT  Moderate malnutrition, morbid obesity, BMI > 40  - secondary to progressive failure to thrive, pt is paraplegic  - nutritionist  consultation ordered  Anemia of chronic disease - Hg and Hct stable and at pt's baseline - CBC in AM  Consultants:  Urology  ID Procedures/Studies:  Cystoscopy, Right retrograde pyelogram, Right ureter stent placement 12/06/2012  Negative extravasation of urine on right retrograde pyelogram. Negative hydronephrosis or hydrourete Dg Retrograde Pyelogram 12/06/2012    No convincing urine leak identified by retrograde pyelography.  The upper pole collecting system is capacious, which may relate to prior surgical intervention.  Proximal aspect of the ureteral stent appears well positioned at the level of the renal pelvis.   Mr Abdomen W Wo Contrast 12/05/2012  1. Resolution of previously seen right posterior abdominal wall fluid collection.  2. Persistent thin fluid filled tract extending from the posterior right kidney to the posterior abdominal wall skin surface. 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  3. Stable large right lumbar hernia containing descending colon.  US Abdomen Limited 12/03/2012  1. Right flank fluid collection is significantly smaller than that seen on the recent CT scan due to recent drainage.  Antibiotics:  Rocephin 06/13 --> 12/07/2012 Unasyn 06/17 -->  Code Status: Full  Family Communication: Pt at bedside  Disposition Plan: Home when medically stable   HPI/Subjective: No events overnight.   Objective: Filed Vitals:   12/06/12 1615 12/06/12 1653 12/06/12 2127 12/07/12 0630  BP: 127/71 151/90 110/59 141/89  Pulse: 64 85 92 95  Temp: 98 F (36.7 C) 97.9 F (36.6 C) 98.6 F (37 C) 98.8 F (37.1 C)  TempSrc:   Oral Oral  Resp: 16 16 20 20   Height:      Weight:  SpO2: 100% 100% 97% 94%    Intake/Output Summary (Last 24 hours) at 12/07/12 1219 Last data filed at 12/07/12 0630  Gross per 24 hour  Intake    880 ml  Output    750 ml  Net    130 ml    Exam:   General:  Pt is alert, follows commands  appropriately, not in acute distress  Cardiovascular: Regular rate and rhythm, S1/S2, no murmurs, no rubs, no gallops  Respiratory: Clear to auscultation bilaterally, no wheezing, no crackles, no rhonchi  Abdomen: Soft, non tender, non distended, bowel sounds present, no guarding  Neuro: Grossly nonfocal  Data Reviewed: Basic Metabolic Panel:  Recent Labs Lab 12/03/12 1421 12/03/12 1930 12/04/12 0520 12/05/12 0600 12/06/12 0800 12/07/12 0430  NA 140 141 140 140 137 138  K 4.4 3.7 4.0 3.8 4.1 3.7  CL 105 107 107 107 106 106  CO2 28 26 27 26 25 26   GLUCOSE 83 101* 103* 101* 98 97  BUN 10 9 11 12 12 11   CREATININE 0.69 0.59 0.69 0.78 0.74 0.78  CALCIUM 9.3 8.8 8.7 8.7 8.7 8.5  MG  --  1.8  --   --   --   --   PHOS  --  2.7  --   --   --   --    Liver Function Tests:  Recent Labs Lab 12/03/12 1421 12/03/12 1930 12/04/12 0520 12/05/12 0600  AST 145* 164* 152* 154*  ALT 72* 75* 73* 83*  ALKPHOS 125* 118* 109 102  BILITOT 0.4 0.4 0.4 0.3  PROT 7.6 7.1 6.4 6.1  ALBUMIN 3.1* 2.9* 2.7* 2.6*    Recent Labs Lab 12/03/12 1421  LIPASE 33   CBC:  Recent Labs Lab 12/03/12 1421 12/03/12 1930 12/04/12 0520 12/05/12 0600 12/06/12 0800 12/07/12 0430  WBC 4.4 5.7 5.5 4.6 5.7 5.4  NEUTROABS 2.3 3.6  --   --   --   --   HGB 12.6 12.1 11.0* 10.6* 11.0* 10.4*  HCT 41.0 38.8 36.7 36.0 35.9* 35.1*  MCV 82.7 81.5 81.7 83.1 83.9 84.4  PLT 475* 482* 399 344 354 276    Recent Results (from the past 240 hour(s))  URINE CULTURE     Status: None   Collection Time    12/03/12  2:16 PM      Result Value Range Status   Specimen Description URINE, CLEAN CATCH   Final   Special Requests NONE   Final   Culture  Setup Time 12/03/2012 22:41   Final   Colony Count 95,000 COLONIES/ML   Final   Culture     Final   Value: Multiple bacterial morphotypes present, none predominant. Suggest appropriate recollection if clinically indicated.   Report Status 12/04/2012 FINAL   Final      Scheduled Meds: . UNASYN IV  3 g Intravenous Q6H  . enoxaparin injection  60 mg Subcutaneous Q24H  . hydrALAZINE  10 mg Intravenous Q8H  . polyethylene glycol  17 g Oral Daily  . senna-docusate  1 tablet Oral BID   Continuous Infusions:   Debbora Presto, MD  [facility] Pager [phone]  If 7PM-7AM, please contact night-coverage [email or web address] Password [email or web address] 12/07/2012, 12:19 PM   LOS: 4 days
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
## 2012-12-07 NOTE — Progress Notes (Signed)
Urology Progress Note  Subjective:     No acute urologic events overnight.  I explained the course of the surgery the surgical findings with the patient last evening and again today. The retrograde pyelogram showed no extravasation of contrast indicating that there is not a urinary leakage. I decided to leave a right ureter stent in place to insure maximum drainage from the kidney. Without any leakage from the kidney, I do not know what the cause of this abscess would be. I recommend involvement of infectious disease.  ROS: Positive: neck pain Negative: SOB  Objective:  Patient Vitals for the past 24 hrs:  BP Temp Temp src Pulse Resp SpO2  12/07/12 0630 141/89 mmHg 98.8 F (37.1 C) Oral 95 20 94 %  12/06/12 2127 110/59 mmHg 98.6 F (37 C) Oral 92 20 97 %  12/06/12 1653 151/90 mmHg 97.9 F (36.6 C) - 85 16 100 %  12/06/12 1615 127/71 mmHg 98 F (36.7 C) - 64 16 100 %  12/06/12 1600 121/65 mmHg - - 64 15 100 %  12/06/12 1545 130/78 mmHg - - 63 14 100 %  12/06/12 1530 115/77 mmHg - - 69 16 100 %  12/06/12 1519 130/62 mmHg 98 F (36.7 C) - 76 12 100 %  12/06/12 1339 152/73 mmHg 98.5 F (36.9 C) Oral 77 18 97 %    Physical Exam: General:  No acute distress, awake Cardiovascular:    [x]   S1/S2 present, RRR  []   Irregularly irregular Chest:  CTA-B Abdomen:               []  Soft, appropriately TTP  [x]  Soft, NTTP  []  Soft, appropriately TTP, incision(s) clean/dry/intact  Back: Right flank dressed without soakage Genitourinary: Foley in place. Foley:  Draining clear yellow urine.    I/O last 3 completed shifts: In: 1600 [P.O.:1200; I.V.:400] Out: 1050 [Urine:1050]  Recent Labs     12/06/12  0800  12/07/12  0430  HGB  11.0*  10.4*  WBC  5.7  5.4  PLT  354  276    Recent Labs     12/06/12  0800  12/07/12  0430  NA  137  138  K  4.1  3.7  CL  106  106  CO2  25  26  BUN  12  11  CREATININE  0.74  0.78  CALCIUM  8.7  8.5  GFRNONAA  >90  >90  GFRAA  >90  >90      No results found for this basename: PT, INR, APTT,  in the last 72 hours   No components found with this basename: ABG,   Wound Culture from 11/25/12 in clinic: Gram Stain: abundant WBC- PMN & mononuclear Rare Gram Positive cocci in pairs.  Final report: Multiple organisms present, none predominant. No Staphylococcus aureus isolated. No Group A Strep (S. Pyogenes) isolated.    Length of stay: 4 days.  Assessment: Right flank/perinephric abscess. Negative urinary leak.    Plan: -Consult Infectious Disease regarding antibiotic treatment and duration. -Continue IV antibiotics. -Continue daily wound packing changes.   Natalia Leatherwood, MD [phone]

## 2012-12-07 NOTE — Clinical Documentation Improvement (Signed)
GENERIC DOCUMENTATION CLARIFICATION QUERY  THIS DOCUMENT IS NOT A PERMANENT PART OF THE MEDICAL RECORD  TO RESPOND TO THE THIS QUERY, FOLLOW THE INSTRUCTIONS BELOW:  1. If needed, update documentation for the patient's encounter via the notes activity.  2. Access this query again and click edit on the In [employer].  3. After updating, or not, click F2 to complete all highlighted (required) fields concerning your review. Select "additional documentation in the medical record" OR "no additional documentation provided".  4. Click Sign note button.  5. The deficiency will fall out of your In Basket *Please let us know if you are not able to complete this workflow by phone or e-mail (listed below).  Please update your documentation within the medical record to reflect your response to this query.                                                                                        12/07/12   To Dorothea Ogle, MD / Associates,  In a better effort to capture your patient's severity of illness, reflect appropriate length of stay and utilization of resources, a review of the patient medical record has revealed the following indicators.    Based on your clinical judgment, please clarify and document in a progress note and/or discharge summary the clinical condition associated with the following supporting information:  In responding to this query please exercise your independent judgment.  The fact that a query is asked, does not imply that any particular answer is desired or expected.   Please clarify the radiologist documentation.  Per the procedure note for the PICC placement reason is due to "UTI", urine culture showing 95, 000 multiple bacterial morphotypes present. Is this an appropriate secondary diagnosis?  Is the "UTI" due to chronic indwelling foley catheter?  Please document in notes if appropriate. Thank you    Possible Clinical Conditions?  Bacturia  UTI due to indwelling  foley catheter   Other Condition  Cannot Clinically Determine    Treatment: Rocephin 1 gm q 24 hours, Unasyn 3 g IV q 6 hours   You may use possible, probable, or suspect with inpatient documentation. possible, probable, suspected diagnoses MUST be documented at the time of discharge  Reviewed: q withdrawn  Thank You,  Lavonda Jumbo, BSN,  Clinical Documentation Specialist: Pager [phone]  Health Information Management [place]

## 2012-12-07 NOTE — Progress Notes (Signed)
ANTIBIOTIC CONSULT NOTE - INITIAL  Pharmacy Consult for Unasyn Indication: Perinephric Abscess  Allergies  Allergen Reactions  . Iohexol Rash   Patient Measurements: Height: 5\' 5"  (165.1 cm) Weight: 240 lb (108.863 kg) IBW/kg (Calculated) : 57  Vital Signs: Temp: 98.8 F (37.1 C) (06/17 0630) Temp src: Oral (06/17 0630) BP: 141/89 mmHg (06/17 0630) Pulse Rate: 95 (06/17 0630) Intake/Output from previous day: 06/16 0701 - 06/17 0700 In: 880 [P.O.:480; I.V.:400] Out: 750 [Urine:750] Intake/Output from this shift:    Labs:  Recent Labs  12/05/12 0600 12/06/12 0800 12/07/12 0430  WBC 4.6 5.7 5.4  HGB 10.6* 11.0* 10.4*  PLT 344 354 276  CREATININE 0.78 0.74 0.78   Estimated Creatinine Clearance: 103.3 ml/min (by C-G formula based on Cr of 0.78). No results found for this basename: VANCOTROUGH, Leodis Binet, VANCORANDOM, GENTTROUGH, GENTPEAK, GENTRANDOM, TOBRATROUGH, TOBRAPEAK, TOBRARND, AMIKACINPEAK, AMIKACINTROU, AMIKACIN,  in the last 72 hours   Microbiology: Recent Results (from the past 720 hour(s))  URINE CULTURE     Status: None   Collection Time    11/23/12  2:45 AM      Result Value Range Status   Specimen Description URINE, CATHETERIZED   Final   Special Requests NONE   Final   Culture  Setup Time 11/23/2012 09:15   Final   Colony Count >=100,000 COLONIES/ML   Final   Culture     Final   Value: Multiple bacterial morphotypes present, none predominant. Suggest appropriate recollection if clinically indicated.   Report Status 11/24/2012 FINAL   Final  URINE CULTURE     Status: None   Collection Time    12/03/12  2:16 PM      Result Value Range Status   Specimen Description URINE, CLEAN CATCH   Final   Special Requests NONE   Final   Culture  Setup Time 12/03/2012 22:41   Final   Colony Count 95,000 COLONIES/ML   Final   Culture     Final   Value: Multiple bacterial morphotypes present, none predominant. Suggest appropriate recollection if clinically  indicated.   Report Status 12/04/2012 FINAL   Final   Medical History: Past Medical History  Diagnosis Date  . Amputation, leg, bilateral, traumatic   . Paralysis   . Kidney stone   . Hypertension 09/2011    had in hospital  . Blood transfusion     several over yrs.   Medications:  Anti-infectives   Start     Dose/Rate Route Frequency Ordered Stop   12/07/12 1215  Ampicillin-Sulbactam (UNASYN) 3 g in sodium chloride 0.9 % 100 mL IVPB     3 g 100 mL/hr over 60 Minutes Intravenous 4 times per day 12/07/12 1200     12/04/12 1400  cefTRIAXone (ROCEPHIN) 1 g in dextrose 5 % 50 mL IVPB  Status:  Discontinued     1 g 100 mL/hr over 30 Minutes Intravenous Every 24 hours 12/03/12 1634 12/07/12 1151   12/03/12 1545  cefTRIAXone (ROCEPHIN) 1 g in dextrose 5 % 50 mL IVPB  Status:  Discontinued     1 g 100 mL/hr over 30 Minutes Intravenous  Once 12/03/12 1538 12/03/12 1634     Assessment: 52 yoF with R perinephric abscess. Paraplegic x 65 y with chronic indwelling foley, multiple stents placed, bilateral AKA.  Admit 6/13 with abscess; nephro-cutaneous fistula vs nephrostomy tube tract. Urine cx with multiple bacteria.  Pyelogram 6/16: R ureteral stent placed, no urine leak noted.  ER visit 6/3 with  c/o UTI, renal stones. Keflex given in ED, on Augmentin PTA 6/13.  ID consult, begin Unasyn per pharmacy  Goal of Therapy:  Appropriate Abx, dose and schedule   Plan:  Unasyn 3gm q6hr  Otho Bellows PharmD Pager [phone] 12/07/2012, 12:22 PM

## 2012-12-07 NOTE — Progress Notes (Signed)
Pt. Refused 0800 glucose check,

## 2012-12-08 LAB — CBC
HCT: 35.9 % — ABNORMAL LOW (ref 36.0–46.0)
Hemoglobin: 10.5 g/dL — ABNORMAL LOW (ref 12.0–15.0)
MCH: 24.7 pg — ABNORMAL LOW (ref 26.0–34.0)
MCHC: 29.2 g/dL — ABNORMAL LOW (ref 30.0–36.0)
MCV: 84.5 fL (ref 78.0–100.0)
RBC: 4.25 MIL/uL (ref 3.87–5.11)

## 2012-12-08 LAB — BASIC METABOLIC PANEL
BUN: 10 mg/dL (ref 6–23)
CO2: 27 mEq/L (ref 19–32)
Calcium: 8.6 mg/dL (ref 8.4–10.5)
Glucose, Bld: 110 mg/dL — ABNORMAL HIGH (ref 70–99)
Sodium: 140 mEq/L (ref 135–145)

## 2012-12-08 NOTE — Progress Notes (Signed)
Pt. Refused 0800 CBG check

## 2012-12-08 NOTE — Progress Notes (Signed)
Urology Progress Note  Subjective:     No acute urologic events overnight.  Has back and neck pain.  ROS: Positive: neck pain Negative: SOB  Objective:  Patient Vitals for the past 24 hrs:  BP Temp Temp src Pulse Resp SpO2  12/08/12 0431 122/61 mmHg 98.7 F (37.1 C) Oral 96 20 95 %  12/07/12 2109 141/61 mmHg 97.9 F (36.6 C) Oral 84 20 99 %  12/07/12 1340 119/55 mmHg 98.8 F (37.1 C) Oral 90 20 96 %    Physical Exam: General:  No acute distress, awake Cardiovascular:    [x]   S1/S2 present, RRR  []   Irregularly irregular Chest:  CTA-B Abdomen:               []  Soft, appropriately TTP  [x]  Soft, NTTP  []  Soft, appropriately TTP, incision(s) clean/dry/intact  Back: Right flank dressed without soakage. Wound without erythema or purulent drainage. Packing in place. Negative crepitus. Genitourinary: Foley in place. Foley:  Draining clear yellow urine.    I/O last 3 completed shifts: In: 1200 [P.O.:1200] Out: 1125 [Urine:1125]  Recent Labs     12/07/12  0430  12/08/12  0430  HGB  10.4*  10.5*  WBC  5.4  5.2  PLT  276  287    Recent Labs     12/07/12  0430  12/08/12  0430  NA  138  140  K  3.7  4.1  CL  106  108  CO2  26  27  BUN  11  10  CREATININE  0.78  0.73  CALCIUM  8.5  8.6  GFRNONAA  >90  >90  GFRAA  >90  >90     No results found for this basename: PT, INR, APTT,  in the last 72 hours   No components found with this basename: ABG,   Wound Culture from 11/25/12 in clinic: Gram Stain: abundant WBC- PMN & mononuclear Rare Gram Positive cocci in pairs.  Final report: Multiple organisms present, none predominant. No Staphylococcus aureus isolated. No Group A Strep (S. Pyogenes) isolated.    Length of stay: 5 days.  Assessment: Right flank/perinephric abscess. Negative urinary leak.    Plan: -Appreciate Infectious Disease input. Will defer duration and type of antibiotics to there recommendations. -No further surgical intervention at this  time. -Follow up with me in clinic 12/23/12 at 11:30 (information placed in discharge section). -Continue daily wound packing and resume home health care wound care upon discharge. -Call with questions.    Natalia Leatherwood, MD [phone]

## 2012-12-08 NOTE — Progress Notes (Signed)
Patient ID: [name], female   DOB: 12-03-1960, 52 y.o.   MRN: [id number] [facility] PROGRESS NOTE  GIZELLE WHETSEL WUJ:[id number] DOB: 01-23-1961 DOA: 12/03/2012 PCP: User [facility]ricity, MD  Brief narrative:  52 year old female with past medical history of paraplegia, bilateral above knee amputation, chronic indwelling urethral catheter, apparently non compliant with mediations who presented to [facility] ED 12/03/2012 with worsening right flank pain as well as abscess formation at the site of nephrostomy tube. Patient reported having 10/10 pain in the right flank area not relieved with home analgesics. No associated fevers or chills. She does report nausea but no vomiting. No reports of chest pain, no shortness of breath and no palpitations.  In ED, vital signs are as follows: BP 188/84, HR 60, Tmax 98.5 F. Her CBC and BMP were essentially unremarkable. Abdominal US showed right flank fluid collection significantly smaller than that seen on the recent CT scan due to recent drainage.  Assessment and Plan:   Right flank pain  - MRI findings consistent with resolution of previously seen right posterior abdominal wall fluid collection, nephro cutaneous fistula noted as well  - pt is status post Cystoscopy, Right retrograde pyelogram, Right ureter stent placement 12/06/2012, post op day #1, clinically stable  - no evidence of urine leak -ID recommends to continue with IV unasyn - pain management with dilaudid  - appreciate urology consultation and recommendations    HTN (hypertension)  - continue hydralazine 10 mg IV Q 8 hours  - Reasonable inpatient control on hydralazine   Transaminitis  - unclear etiology, - elevated AST and ALT  - Will follow LFT's in am.  Moderate malnutrition, morbid obesity, BMI > 40  - secondary to progressive failure to thrive, pt is paraplegic  - nutritionist consultation ordered   Anemia of chronic disease - Hg and Hct stable and at pt's baseline - CBC in  AM  Consultants:  Urology  ID  Procedures/Studies:  Cystoscopy, Right retrograde pyelogram, Right ureter stent placement 12/06/2012  Negative extravasation of urine on right retrograde pyelogram. Negative hydronephrosis or hydrourete Dg Retrograde Pyelogram 12/06/2012    No convincing urine leak identified by retrograde pyelography.  The upper pole collecting system is capacious, which may relate to prior surgical intervention.  Proximal aspect of the ureteral stent appears well positioned at the level of the renal pelvis.   Mr Abdomen W Wo Contrast 12/05/2012  1. Resolution of previously seen right posterior abdominal wall fluid collection.  2. Persistent thin fluid filled tract extending from the posterior right kidney to the posterior abdominal wall skin surface. 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  3. Stable large right lumbar hernia containing descending colon.  US Abdomen Limited 12/03/2012  1. Right flank fluid collection is significantly smaller than that seen on the recent CT scan due to recent drainage.  Antibiotics:  Rocephin 06/13 --> 12/07/2012 Unasyn 06/17 -->  Code Status: Full  Family Communication: Pt at bedside  Disposition Plan: Home when medically stable   HPI/Subjective: Patient seen, no new complaints.  Objective: Filed Vitals:   12/07/12 1340 12/07/12 2109 12/08/12 0431 12/08/12 1351  BP: 119/55 141/61 122/61 107/51  Pulse: 90 84 96 60  Temp: 98.8 F (37.1 C) 97.9 F (36.6 C) 98.7 F (37.1 C)   TempSrc: Oral Oral Oral   Resp: 20 20 20    Height:      Weight:      SpO2: 96% 99% 95%  Intake/Output Summary (Last 24 hours) at 12/08/12 1436 Last data filed at 12/08/12 1410  Gross per 24 hour  Intake    960 ml  Output    525 ml  Net    435 ml    Exam:   General:  Appear in no acute distress  Cardiovascular: S1S2  Regular rate and rhythm,  Respiratory: Clear to auscultation bilaterally, no  wheezing, no crackles  Abdomen: Soft, non tender, non distended, bowel sounds present, no guarding  Neuro: Grossly nonfocal  Data Reviewed: Basic Metabolic Panel:  Recent Labs Lab 12/03/12 1421 12/03/12 1930 12/04/12 0520 12/05/12 0600 12/06/12 0800 12/07/12 0430 12/08/12 0430  NA 140 141 140 140 137 138 140  K 4.4 3.7 4.0 3.8 4.1 3.7 4.1  CL 105 107 107 107 106 106 108  CO2 28 26 27 26 25 26 27   GLUCOSE 83 101* 103* 101* 98 97 110*  BUN 10 9 11 12 12 11 10   CREATININE 0.69 0.59 0.69 0.78 0.74 0.78 0.73  CALCIUM 9.3 8.8 8.7 8.7 8.7 8.5 8.6  MG  --  1.8  --   --   --   --   --   PHOS  --  2.7  --   --   --   --   --    Liver Function Tests:  Recent Labs Lab 12/03/12 1421 12/03/12 1930 12/04/12 0520 12/05/12 0600  AST 145* 164* 152* 154*  ALT 72* 75* 73* 83*  ALKPHOS 125* 118* 109 102  BILITOT 0.4 0.4 0.4 0.3  PROT 7.6 7.1 6.4 6.1  ALBUMIN 3.1* 2.9* 2.7* 2.6*    Recent Labs Lab 12/03/12 1421  LIPASE 33   CBC:  Recent Labs Lab 12/03/12 1421 12/03/12 1930 12/04/12 0520 12/05/12 0600 12/06/12 0800 12/07/12 0430 12/08/12 0430  WBC 4.4 5.7 5.5 4.6 5.7 5.4 5.2  NEUTROABS 2.3 3.6  --   --   --   --   --   HGB 12.6 12.1 11.0* 10.6* 11.0* 10.4* 10.5*  HCT 41.0 38.8 36.7 36.0 35.9* 35.1* 35.9*  MCV 82.7 81.5 81.7 83.1 83.9 84.4 84.5  PLT 475* 482* 399 344 354 276 287    Recent Results (from the past 240 hour(s))  URINE CULTURE     Status: None   Collection Time    12/03/12  2:16 PM      Result Value Range Status   Specimen Description URINE, CLEAN CATCH   Final   Special Requests NONE   Final   Culture  Setup Time 12/03/2012 22:41   Final   Colony Count 95,000 COLONIES/ML   Final   Culture     Final   Value: Multiple bacterial morphotypes present, none predominant. Suggest appropriate recollection if clinically indicated.   Report Status 12/04/2012 FINAL   Final     Scheduled Meds: . UNASYN IV  3 g Intravenous Q6H  . enoxaparin injection  60 mg  Subcutaneous Q24H  . hydrALAZINE  10 mg Intravenous Q8H  . polyethylene glycol  17 g Oral Daily  . senna-docusate  1 tablet Oral BID   Continuous Infusions:   Meredeth Ide, MD  [facility] Pager [phone]  If 7PM-7AM, please contact night-coverage [email or web address] Password [email or web address] 12/08/2012, 2:36 PM   LOS: 5 days

## 2012-12-08 NOTE — Progress Notes (Signed)
[facility]    Subjective: Feels better   Antibiotics:  Anti-infectives   Start     Dose/Rate Route Frequency Ordered Stop   12/07/12 1215  Ampicillin-Sulbactam (UNASYN) 3 g in sodium chloride 0.9 % 100 mL IVPB     3 g 100 mL/hr over 60 Minutes Intravenous 4 times per day 12/07/12 1200     12/04/12 1400  cefTRIAXone (ROCEPHIN) 1 g in dextrose 5 % 50 mL IVPB  Status:  Discontinued     1 g 100 mL/hr over 30 Minutes Intravenous Every 24 hours 12/03/12 1634 12/07/12 1151   12/03/12 1545  cefTRIAXone (ROCEPHIN) 1 g in dextrose 5 % 50 mL IVPB  Status:  Discontinued     1 g 100 mL/hr over 30 Minutes Intravenous  Once 12/03/12 1538 12/03/12 1634      Medications: Scheduled Meds: . ampicillin-sulbactam (UNASYN) IV  3 g Intravenous Q6H  . enoxaparin (LOVENOX) injection  60 mg Subcutaneous Q24H  . hydrALAZINE  10 mg Intravenous Q8H  . polyethylene glycol  17 g Oral Daily  . senna-docusate  1 tablet Oral BID   Continuous Infusions:  PRN Meds:.acetaminophen, acetaminophen, HYDROcodone-acetaminophen, HYDROmorphone (DILAUDID) injection, ondansetron (ZOFRAN) IV, ondansetron, promethazine   Objective: Weight change:   Intake/Output Summary (Last 24 hours) at 12/08/12 1524 Last data filed at 12/08/12 1410  Gross per 24 hour  Intake    960 ml  Output    525 ml  Net    435 ml   Blood pressure 111/62, pulse 85, temperature 98.1 F (36.7 C), temperature source Oral, resp. rate 20, height 5\' 5"  (1.651 m), weight 240 lb (108.863 kg), SpO2 95.00%. Temp:  [97.9 F (36.6 C)-98.7 F (37.1 C)] 98.1 F (36.7 C) (06/18 1517) Pulse Rate:  [60-96] 85 (06/18 1517) Resp:  [20] 20 (06/18 1517) BP: (107-141)/(51-62) 111/62 mmHg (06/18 1517) SpO2:  [95 %-99 %] 95 % (06/18 1517)  Physical Exam: General: Alert and awake, oriented x3, not in any acute distress.  HEENT: anicteric sclera,  EOMI, oropharynx clear and without exudate  CVS regular rate, normal r, no  murmur rubs or gallops  Chest: clear to auscultation bilaterally, no wheezing, rales or rhonchi  Abdomen: soft nontender, nondistended, normal bowel sounds,  MSK, skin: back wound not examined today Neuro: paraplegic   Lab Results:  Recent Labs  12/07/12 0430 12/08/12 0430  WBC 5.4 5.2  HGB 10.4* 10.5*  HCT 35.1* 35.9*  PLT 276 287    BMET  Recent Labs  12/07/12 0430 12/08/12 0430  NA 138 140  K 3.7 4.1  CL 106 108  CO2 26 27  GLUCOSE 97 110*  BUN 11 10  CREATININE 0.78 0.73  CALCIUM 8.5 8.6    Micro Results: Recent Results (from the past 240 hour(s))  URINE CULTURE     Status: None   Collection Time    12/03/12  2:16 PM      Result Value Range Status   Specimen Description URINE, CLEAN CATCH   Final   Special Requests NONE   Final   Culture  Setup Time 12/03/2012 22:41   Final   Colony Count 95,000 COLONIES/ML   Final   Culture     Final   Value: Multiple bacterial morphotypes present, none predominant. Suggest appropriate recollection if clinically indicated.   Report Status 12/04/2012 FINAL   Final    Studies/Results: No results found.    Assessment/Plan: [name] is a 52 y.o.  female  with hx of PCNL in 2013 and abscess that developed at site of nephrostomy tube tract, with persistence of tract to kidney sp I and D by Dr. 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  Please call with further questions.    LOS: 5 days   Acey Lav 12/08/2012, 3:24 PM

## 2012-12-09 DIAGNOSIS — T8183XA Persistent postprocedural fistula, initial encounter: Secondary | ICD-10-CM

## 2012-12-09 MED ORDER — AMLODIPINE BESYLATE 10 MG PO TABS
10.0000 mg | ORAL_TABLET | Freq: Every day | ORAL | Status: DC
  Administered 2012-12-09: 10 mg via ORAL
  Filled 2012-12-09: qty 1

## 2012-12-09 MED ORDER — AMLODIPINE BESYLATE 10 MG PO TABS: 10.0000 mg | ORAL_TABLET | Freq: Every day | ORAL | Status: AC

## 2012-12-09 MED ORDER — AMOXICILLIN-POT CLAVULANATE 875-125 MG PO TABS
1.0000 | ORAL_TABLET | Freq: Two times a day (BID) | ORAL | Status: DC
  Administered 2012-12-09: 1 via ORAL
  Filled 2012-12-09 (×2): qty 1

## 2012-12-09 MED ORDER — HYDROCODONE-ACETAMINOPHEN 5-325 MG PO TABS: 2.0000 | ORAL_TABLET | ORAL | Status: DC | PRN

## 2012-12-09 MED ORDER — POLYETHYLENE GLYCOL 3350 17 G PO PACK: 17.0000 g | PACK | Freq: Every day | ORAL | Status: DC

## 2012-12-09 MED ORDER — SENNOSIDES-DOCUSATE SODIUM 8.6-50 MG PO TABS: 1.0000 | ORAL_TABLET | Freq: Two times a day (BID) | ORAL | Status: DC

## 2012-12-09 MED ORDER — AMOXICILLIN-POT CLAVULANATE 875-125 MG PO TABS: 1.0000 | ORAL_TABLET | Freq: Two times a day (BID) | ORAL | Status: DC

## 2012-12-09 NOTE — Discharge Summary (Signed)
Physician Discharge Summary  [name]:[id number] DOB: Oct 16, 1960 DOA: 12/03/2012  PCP: User [facility]ricity, MD  Admit date: 12/03/2012 Discharge date: 12/09/2012  Time spent: 50* minutes  Recommendations for Outpatient Follow-up:  1. Follow up ID in 2 weeks 2. Follow up Urology in 2 weeks 3. Repeat liver enzymes as outpatient per PCP  Discharge Diagnoses:  Principal Problem:   Flank pain Active Problems:   HTN (hypertension)   Discharge Condition: Stable  Diet recommendation: Low salt diet  Filed Weights   12/03/12 1852  Weight: 108.863 kg (240 lb)    History of present illness:  52 year old female with past medical history of paraplegia, bilateral above knee amputation, chronic indwelling urethral catheter, apparently non compliant with mediations who presented to [facility] ED 12/03/2012 with worsening right flank pain as well as abscess formation at the site of nephrostomy tube. Patient reported having 10/10 pain in the right flank area not relieved with home analgesics. No associated fevers or chills. She does report nausea but no vomiting. No reports of chest pain, no shortness of breath and no palpitations.  In ED, vital signs are as follows: BP 188/84, HR 60, Tmax 98.5 F. Her CBC and BMP were essentially unremarkable. Abdominal US showed right flank fluid collection significantly smaller than that seen on the recent CT scan due to recent drainage.   Hospital Course:  Right flank pain  - MRI findings consistent with resolution of previously seen right posterior abdominal wall fluid collection, nephro cutaneous fistula noted as well  - pt is status post Cystoscopy, Right retrograde pyelogram, Right ureter stent placement 12/06/2012, post op day #1, clinically stable  - no evidence of urine leak  -ID recommends to continue Augmentin 875 mg po BID for months, and ID will decide the stop date.  HTN (hypertension)  - Was given IV hydralazine in the hospital -Will start amlodipine  10 mg po daily  Transaminitis  - unclear etiology,  - elevated AST and ALT  - will need repeat labs as outpatient in 4 weeks  Anemia of chronic disease  - Hg and Hct stable and at pt's baseline     Procedures: Procedures/Studies:  Cystoscopy, Right retrograde pyelogram, Right ureter stent placement 12/06/2012  Negative extravasation of urine on right retrograde pyelogram. Negative hydronephrosis or hydrourete  Dg Retrograde Pyelogram 12/06/2012  No convincing urine leak identified by retrograde pyelography. The upper pole collecting system is capacious, which may relate to prior surgical intervention. Proximal aspect of the ureteral stent appears well positioned at the level of the renal pelvis.  Mr Abdomen W Wo Contrast 12/05/2012  1. Resolution of previously seen right posterior abdominal wall fluid collection.  2. Persistent thin fluid filled tract extending from the posterior right kidney to the posterior abdominal wall skin surface. 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  3. Stable large right lumbar hernia containing descending colon.  US Abdomen Limited 12/03/2012  1. Right flank fluid collection is significantly smaller than that seen on the recent CT scan due to recent drainage.     Consultations:  ID  urology  Discharge Exam: Filed Vitals:   12/08/12 1351 12/08/12 1517 12/08/12 2041 12/09/12 0518  BP: 107/51 111/62 125/50 123/70  Pulse: 60 85 87 74  Temp:  98.1 F (36.7 C) 98 F (36.7 C) 98.1 F (36.7 C)  TempSrc:  Oral Axillary Oral  Resp:  20 20 18   Height:      Weight:  SpO2:  95% 97% 98%    General: Appear in no acute distress Cardiovascular: s1s2 RRR Respiratory: Clear bilaterally Ext: No edema  Discharge Instructions  Discharge Orders   Future Orders Complete By Expires     Diet - low sodium heart healthy  As directed     Increase activity slowly  As directed         Medication List    TAKE  these medications       amoxicillin-clavulanate 875-125 MG per tablet  Commonly known as:  AUGMENTIN  Take 1 tablet by mouth 2 (two) times daily. Take twice daily, ID will determine the stop date     HYDROcodone-acetaminophen 5-325 MG per tablet  Commonly known as:  NORCO/VICODIN  Take 2 tablets by mouth every 4 (four) hours as needed for pain.     polyethylene glycol packet  Commonly known as:  MIRALAX / GLYCOLAX  Take 17 g by mouth daily.     senna-docusate 8.6-50 MG per tablet  Commonly known as:  Senokot-S  Take 1 tablet by mouth 2 (two) times daily.       Allergies  Allergen Reactions  . Iohexol Rash       Follow-up Information   Follow up with Milford Cage, MD On 12/23/2012. (11:30 am)    Contact information:   [address] FLO[address] [place] [address] [phone]       Follow up with Acey Lav, MD In 2 weeks.   Contact information:   [address] [place] [address] [phone]        The results of significant diagnostics from this hospitalization (including imaging, microbiology, ancillary and laboratory) are listed below for reference.    Significant Diagnostic Studies: Ct Abdomen Pelvis Wo Contrast  11/23/2012   *RADIOLOGY REPORT*  Clinical Data: Decreased urine output.  Abdominal pain.  CT ABDOMEN AND PELVIS WITHOUT CONTRAST  Technique:  Multidetector CT imaging of the abdomen and pelvis was performed following the standard protocol without intravenous contrast.  Comparison: CT of the abdomen and pelvis 12/17/2011.  Findings:  Lung Bases: Linear opacity in the right lower lobe, similar to the prior study, compatible with chronic scarring.  There are several areas of ground-glass attenuation within the visualized lung bases, the largest of which is in the lateral aspect of the left lower lobe measuring approximately 2.7 x 1.3 cm.  This larger lesion is slightly larger than  the prior examination dated 12/17/2011.  No central solid component is identified at this time. Atherosclerotic calcifications are noted within the left anterior descending coronary artery.  Calcifications of the mitral annulus.  Abdomen/Pelvis:  In the right flank soft tissues there is a large low attenuation collection measuring approximately 7.2 x 8.9 cm, which appears to extend to the overlying skin surface, and there appears associated with the underlying right kidney.  This is suspicious for potential abscess, or draining urinary fistula. Numerous calcifications within the collecting systems of the kidneys bilaterally, largest of which is in the interpolar region of the left kidney measuring 12 mm in diameter, compatible with nonobstructive calculi.  No additional calcifications are noted along the course of either ureter.  No hydroureteronephrosis at this time to suggest urinary tract obstruction.  A Foley balloon catheter is present within the lumen of the urinary bladder which is completely decompressed.  Status post cholecystectomy.  The unenhanced appearance of the visualized liver, pancreas, spleen and bilateral  adrenal glands is unremarkable.  Large right-sided lumbar hernia containing a short segment of the colon.  No evidence of associated bowel incarceration or obstruction at this time.  No significant volume of ascites.  No pneumoperitoneum.  No pathologic distension of small bowel.  Numerous reactive sized retroperitoneal lymph nodes are noted.  An IVC filter is in position with tip terminating just beneath the level of the renal veins.  Uterus and bilateral ovaries are unremarkable.  Marked thickening of the perirectal soft tissues, with distortion of the perianal soft tissues posteriorly, which could suggest the presence of a perirectal abscess or draining sinus tract.  Musculoskeletal: There are no aggressive appearing lytic or blastic lesions noted in the visualized portions of the skeleton.   IMPRESSION:  1.  Large low attenuation collection extending from the right kidney to the skin surface in the right flank, suspicious for either a large abscess or potentially a draining the urinary fistula.  Clinical correlation is recommended. 2.  Multiple nonobstructive calculi within the collecting systems of the kidneys bilaterally, largest of which measures 12 mm in the interpolar region of the left kidney. 3.  Markedly thickened and perirectal soft tissues which appear very irregular posterior to the rectum and in the perianal area. The possibility of a small perirectal abscess or draining sinus tract in this region is not excluded. 4.  Large right lumbar hernia.  This contains a short segment of the cecum and proximal ascending colon.  No associated bowel incarceration or obstruction at this time. 5.  Persistence of a nodular appearing 2.7 x 1.3 cm area of ground- glass attenuation in the left lower lobe is concerning for a slow- growing indolent neoplasm such as a low grade adenocarcinoma. Continued attention on future follow up studies is suggested. Repeat chest CT in 1 year is recommended at this time.   This recommendation follows the consensus statement: Recommendations for the Management of Subsolid Pulmonary Nodules Detected at CT:  A Statement from the Fleischner Society as published in Radiology 2013; 266:304-317.   Original Report Authenticated By: Trudie Reed, M.D.   Mr Abdomen W Wo Contrast  12/05/2012   *RADIOLOGY REPORT*  Clinical Data: Right flank pain.  Right perinephric fluid collection.  Previous nephrolithotomy.  MRI ABDOMEN WITH AND WITHOUT CONTRAST  Technique:  Multiplanar multisequence MR imaging of the abdomen was performed both before and after administration of intravenous contrast.  Contrast: 20mL MULTIHANCE GADOBENATE DIMEGLUMINE 529 MG/ML IV SOLN  Comparison: CT on 11/23/2012  Findings: Bilateral renal parenchymal scarring is noted. There is no evidence of complex cystic or  solid renal masses.  No evidence of hydronephrosis.  A thin fluid-filled tract with surrounding soft tissue density is seen extending from the posterior right kidney through the subcutaneous tissues to the skin surface.  This is consistent with a nephro-cutaneous fistula or patent nephrostomy tract.  The previously seen fluid collection in the right posterior abdominal wall soft tissues along the tract is now resolved.  A large right lumbar hernia containing the descending colon is again seen lateral to the tract.  The liver, spleen, pancreas, and adrenal glands are normal appearance.  No soft tissue masses or lymphadenopathy identified.  IMPRESSION:  1.  Resolution of previously seen right posterior abdominal wall fluid collection. 2.  Persistent thin fluid filled tract extending from the posterior right kidney to the posterior abdominal wall skin surface.  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 3.  Stable large right lumbar hernia  containing descending colon.   Original Report Authenticated By: Myles Rosenthal, M.D.   Dg Retrograde Pyelogram  12/06/2012   *RADIOLOGY REPORT*  Clinical data:   Right flank abscess and history of prior percutaneous nephrolithotomy.  INTRAOPERATIVE RIGHT RETROGRADE UROGRAPHY  Comparison:  CT on 11/23/2012  Technique:  Images were obtained with the C-arm fluoroscopic device intraoperatively and submitted for interpretation post-operatively. Please see the procedural report for the amount of contrast and the fluoroscopy time utilized.  Findings:  Intraoperative images were obtained with a C-arm.  After cannulation of the right ureter, contrast injection shows normal patency of the right ureter and opacification of the renal collecting system.  There is a somewhat capacious appearance to the upper pole collecting system which may relate to prior intervention.  However, this does not appear to represent leak of contrast material and no overt  urinary leak is identified.  A completion image shows placement of a ureteral stent which is formed at the level of the right renal pelvis.  IMPRESSION: No convincing urine leak identified by retrograde pyelography.  The upper pole collecting system is capacious, which may relate to prior surgical intervention.  Proximal aspect of the ureteral stent appears well positioned at the level of the renal pelvis.   Original Report Authenticated By: Irish Lack, M.D.   US Abdomen Limited  12/03/2012   *RADIOLOGY REPORT*  Clinical Data: Perinephric abscess  LIMITED ABDOMINAL ULTRASOUND  Comparison:  CT 11/23/2012  Findings: Ultrasound scanning was limited to the right flank. Complex fluid collection in the right posterior lateral flank measures approximately 15 x 26 mm and is significantly smaller compared with the prior study. Interval drainage of this collection.  This may represent a urinoma or abscess based on its location which is contiguous  with the posterior right kidney.  The right kidney is only partially imaged but does not show significant hydronephrosis.  IMPRESSION: Right flank fluid collection is significantly smaller than that seen on the recent CT scan due to recent drainage.   Original Report Authenticated By: Janeece Riggers, M.D.    Microbiology: Recent Results (from the past 240 hour(s))  URINE CULTURE     Status: None   Collection Time    12/03/12  2:16 PM      Result Value Range Status   Specimen Description URINE, CLEAN CATCH   Final   Special Requests NONE   Final   Culture  Setup Time 12/03/2012 22:41   Final   Colony Count 95,000 COLONIES/ML   Final   Culture     Final   Value: Multiple bacterial morphotypes present, none predominant. Suggest appropriate recollection if clinically indicated.   Report Status 12/04/2012 FINAL   Final     Labs: Basic Metabolic Panel:  Recent Labs Lab 12/03/12 1421 12/03/12 1930 12/04/12 0520 12/05/12 0600 12/06/12 0800 12/07/12 0430  12/08/12 0430  NA 140 141 140 140 137 138 140  K 4.4 3.7 4.0 3.8 4.1 3.7 4.1  CL 105 107 107 107 106 106 108  CO2 28 26 27 26 25 26 27   GLUCOSE 83 101* 103* 101* 98 97 110*  BUN 10 9 11 12 12 11 10   CREATININE 0.69 0.59 0.69 0.78 0.74 0.78 0.73  CALCIUM 9.3 8.8 8.7 8.7 8.7 8.5 8.6  MG  --  1.8  --   --   --   --   --   PHOS  --  2.7  --   --   --   --   --  Liver Function Tests:  Recent Labs Lab 12/03/12 1421 12/03/12 1930 12/04/12 0520 12/05/12 0600  AST 145* 164* 152* 154*  ALT 72* 75* 73* 83*  ALKPHOS 125* 118* 109 102  BILITOT 0.4 0.4 0.4 0.3  PROT 7.6 7.1 6.4 6.1  ALBUMIN 3.1* 2.9* 2.7* 2.6*    Recent Labs Lab 12/03/12 1421  LIPASE 33   No results found for this basename: AMMONIA,  in the last 168 hours CBC:  Recent Labs Lab 12/03/12 1421 12/03/12 1930 12/04/12 0520 12/05/12 0600 12/06/12 0800 12/07/12 0430 12/08/12 0430  WBC 4.4 5.7 5.5 4.6 5.7 5.4 5.2  NEUTROABS 2.3 3.6  --   --   --   --   --   HGB 12.6 12.1 11.0* 10.6* 11.0* 10.4* 10.5*  HCT 41.0 38.8 36.7 36.0 35.9* 35.1* 35.9*  MCV 82.7 81.5 81.7 83.1 83.9 84.4 84.5  PLT 475* 482* 399 344 354 276 287   Cardiac Enzymes: No results found for this basename: CKTOTAL, CKMB, CKMBINDEX, TROPONINI,  in the last 168 hours BNP: BNP (last 3 results) No results found for this basename: PROBNP,  in the last 8760 hours CBG: No results found for this basename: GLUCAP,  in the last 168 hours     Signed:  Myna Freimark S  [facility] 12/09/2012, 10:30 AM

## 2012-12-09 NOTE — Progress Notes (Signed)
Patient has a foley and she said she used it at home,Dr. [place] notified and said patient can go home with foley catheter.- Hulda Marin RN

## 2012-12-09 NOTE — Progress Notes (Signed)
Patient refused CBG checks this am.- Hulda Marin RN

## 2012-12-09 NOTE — Progress Notes (Signed)
Patient d/c home with husband, foley bag changed to leg bag,stable.Hulda Marin RN

## 2012-12-09 NOTE — Progress Notes (Signed)
D/c instructions given to patient, verbalized understanding,MD gave her prescriptions earlier,stable.Hulda Marin RN

## 2012-12-09 NOTE — Care Management Note (Signed)
Md order for [facility] for daily dressing changes. Pt offered choice of HH agencies. Per pt currently active with [facility]. [facility] rep Lanae Crumbly notified of pt discharge 12/09/12. Pt states spouse will assist in home care and provide tx home with specialty Zenaida Niece. No other needs specified.   Roxy Manns Kennita Pavlovich,RN,BSN [phone]

## 2012-12-10 NOTE — Telephone Encounter (Signed)
e

## 2012-12-16 ENCOUNTER — Ambulatory Visit (INDEPENDENT_AMBULATORY_CARE_PROVIDER_SITE_OTHER): Payer: Managed Care, Other (non HMO) | Admitting: Internal Medicine

## 2012-12-16 ENCOUNTER — Encounter: Payer: Self-pay | Admitting: Internal Medicine

## 2012-12-16 VITALS — BP 188/121 | HR 84 | Temp 99.8°F

## 2012-12-16 DIAGNOSIS — N200 Calculus of kidney: Secondary | ICD-10-CM | POA: Insufficient documentation

## 2012-12-16 DIAGNOSIS — L02219 Cutaneous abscess of trunk, unspecified: Secondary | ICD-10-CM

## 2012-12-16 DIAGNOSIS — R197 Diarrhea, unspecified: Secondary | ICD-10-CM | POA: Insufficient documentation

## 2012-12-16 DIAGNOSIS — L02211 Cutaneous abscess of abdominal wall: Secondary | ICD-10-CM

## 2012-12-16 MED ORDER — AMOXICILLIN 500 MG PO CAPS: 500.0000 mg | ORAL_CAPSULE | Freq: Three times a day (TID) | ORAL | Status: DC

## 2012-12-16 MED ORDER — METRONIDAZOLE 500 MG PO TABS: 500.0000 mg | ORAL_TABLET | Freq: Three times a day (TID) | ORAL | Status: DC

## 2012-12-16 NOTE — Progress Notes (Signed)
Patient ID: [name], female   DOB: 14-Jun-1961, 52 y.o.   MRN: [id number]         [facility]  Patient Active Problem List   Diagnosis Date Noted  . Abscess of flank 12/16/2012  . Nephrolithiasis 12/16/2012  . Diarrhea 12/16/2012  . HTN (hypertension) 12/03/2012  . DECUBITUS ULCER 08/20/2006    Patient's Medications  New Prescriptions   AMOXICILLIN (AMOXIL) 500 MG CAPSULE    Take 1 capsule (500 mg total) by mouth 3 (three) times daily.   METRONIDAZOLE (FLAGYL) 500 MG TABLET    Take 1 tablet (500 mg total) by mouth 3 (three) times daily.  Previous Medications   AMLODIPINE (NORVASC) 10 MG TABLET    Take 1 tablet (10 mg total) by mouth daily.   HYDROCODONE-ACETAMINOPHEN (NORCO/VICODIN) 5-325 MG PER TABLET    Take 2 tablets by mouth every 4 (four) hours as needed for pain.   POLYETHYLENE GLYCOL (MIRALAX / GLYCOLAX) PACKET    Take 17 g by mouth daily.   SENNA-DOCUSATE (SENOKOT-S) 8.6-50 MG PER TABLET    Take 1 tablet by mouth 2 (two) times daily.  Modified Medications   No medications on file  Discontinued Medications   AMOXICILLIN-CLAVULANATE (AUGMENTIN) 875-125 MG PER TABLET    Take 1 tablet by mouth 2 (two) times daily. Take twice daily, ID will determine the stop date    Subjective: Ms. Lazard is in for her hospital followup visit with her husband. She has a history of nephrolithiasis and underwent right percutaneous nephrostomy one year ago. Apparently she developed some right flank infection after that procedure. Group F. streptococcus was grown from culture and she was treated with amoxicillin and apparently cured. However late last month she developed right flank pain and was found to have recurrent right flank abscess. There is no apparent urinary leak. She underwent incision and drainage on June 5. Cultures were reported only has multiple organisms with no staph or strep. She was hospitalized for repeat drainage on June 16. She was treated in the  hospital with Unasyn then changed to oral Augmentin by my partner, Dr. Daiva Eves. She is now completed 10 days of postoperative therapy. Her flank pain has resolved. Overall she is feeling better but she says that she is having a great deal of difficulty tolerating the antibiotic. She says that it "tears up my stomach" and has been causing her to have diarrhea daily for the last 4 days. She has not had any fever, nausea or vomiting.  Review of Systems: Pertinent items are noted in HPI.  Past Medical History  Diagnosis Date  . Amputation, leg, bilateral, traumatic   . Paralysis   . Kidney stone   . Hypertension 09/2011    had in hospital  . Blood transfusion     several over yrs.    History  Substance Use Topics  . Smoking status: Former Smoker -- 0 years    Types: Cigarettes    Quit date: 11/04/2001  . Smokeless tobacco: Never Used  . Alcohol Use: No    No family history on file.  Allergies  Allergen Reactions  . Iohexol Rash    Objective: Temp: 99.8 F (37.7 C) (06/26 1358) Temp src: Oral (06/26 1358) BP: 188/121 mmHg (06/26 1358) Pulse Rate: 84 (06/26 1358)  General: He is in no distress. She is seated in her wheelchair Skin: No rash Lungs: Clear Cor: Regular S1 and S2 and no murmurs Abdomen: Obese, soft and nontender  Right posterior flank: She has about a 12 cm open wound that is packed to the depth of about 1-1/2 inches. Her some yellow-green drainage on the gauze dressing. There is no surrounding cellulitis or fluctuance. There is no odor.   Assessment: She has recurrent right flank abscess is now polymicrobial. She seems to be improving but is not tolerating Augmentin well. Her husband seems to think that the diarrhea is not too different than normal for her. However I will try to obtain a stool sample to make sure she does not have C. difficile colitis. She tolerated amoxicillin well last year. I will treat her now with a combination of amoxicillin and  metronidazole.  Plan: 1. Change Augmentin to amoxicillin plus metronidazole 2. Stool for C. difficile colitis if she can bring back a sample 3. Followup in 2 weeks   Cliffton Asters, MD [facility] [phone] pager   [phone] cell 12/16/2012, 3:45 PM

## 2012-12-30 ENCOUNTER — Ambulatory Visit (INDEPENDENT_AMBULATORY_CARE_PROVIDER_SITE_OTHER): Payer: Managed Care, Other (non HMO) | Admitting: Internal Medicine

## 2012-12-30 ENCOUNTER — Encounter: Payer: Self-pay | Admitting: Internal Medicine

## 2012-12-30 VITALS — BP 125/80 | HR 82 | Temp 97.6°F

## 2012-12-30 DIAGNOSIS — L02211 Cutaneous abscess of abdominal wall: Secondary | ICD-10-CM

## 2012-12-30 DIAGNOSIS — N2889 Other specified disorders of kidney and ureter: Secondary | ICD-10-CM

## 2012-12-30 DIAGNOSIS — L02219 Cutaneous abscess of trunk, unspecified: Secondary | ICD-10-CM

## 2012-12-30 NOTE — Progress Notes (Signed)
[facility] NOTE  RFV: follow up for perinephric polymicrobial abscess Subjective:    Patient ID: [name], female    DOB: December 17, 1960, 52 y.o.   MRN: [id number]  HPI Ms. Deats who has a history of nephrolithiasis and underwent right percutaneous nephrostomy in 2013 c/b right flank infection by Group F. streptococcus  treated with amoxicillin and apparently cured. In late May/early June she developed right flank pain and was found to have recurrent right flank abscess. There is no apparent urinary leak. She underwent incision and drainage on June 5. Cultures were reported only has multiple organisms with no staph or strep. She was treated with amp/sub an changed to amox/clav as an outpatient for 10 days after discharge. She was hospitalized for repeat drainage/urinary stent placement on June 16. Marland Kitchen Her flank pain has resolved. Overall she is feeling better after switching back amoxicillin plus metronidazole. She says that augmentin  "tears up my stomach" and has been causing her to have diarrhea daily for many days. She has not had any fever, nausea or vomiting. She states that the metronidazole tablets are difficult to swallow due to her prior trach surgery/scar tissue.   Current Outpatient Prescriptions on File Prior to Visit  Medication Sig Dispense Refill  . amLODipine (NORVASC) 10 MG tablet Take 1 tablet (10 mg total) by mouth daily.  30 tablet  0  . amoxicillin (AMOXIL) 500 MG capsule Take 1 capsule (500 mg total) by mouth 3 (three) times daily.  42 capsule  1  . HYDROcodone-acetaminophen (NORCO/VICODIN) 5-325 MG per tablet Take 2 tablets by mouth every 4 (four) hours as needed for pain.  30 tablet  0  . polyethylene glycol (MIRALAX / GLYCOLAX) packet Take 17 g by mouth daily.  14 each  0  . senna-docusate (SENOKOT-S) 8.6-50 MG per tablet Take 1 tablet by mouth 2 (two) times daily.  30 tablet  1  . metroNIDAZOLE (FLAGYL) 500 MG tablet Take 1 tablet (500 mg total) by mouth 3 (three) times daily.   42 tablet  1   Current Facility-Administered Medications on File Prior to Visit  Medication Dose Route Frequency Provider Last Rate Last Dose  . sodium chloride 0.9 % injection 10-40 mL  10-40 mL Intracatheter PRN Milford Cage, MD   10 mL at 12/22/11 1122   Active Ambulatory Problems    Diagnosis Date Noted  . DECUBITUS ULCER 08/20/2006  . HTN (hypertension) 12/03/2012  . Abscess of flank 12/16/2012  . Nephrolithiasis 12/16/2012  . Diarrhea 12/16/2012   Resolved Ambulatory Problems    Diagnosis Date Noted  . OBESITY, NOS 08/20/2006  . Nephrolithiasis 12/16/2011  . Flank pain 12/03/2012   Past Medical History  Diagnosis Date  . Amputation, leg, bilateral, traumatic   . Paralysis   . Kidney stone   . Hypertension 09/2011  . Blood transfusion      Review of Systems 12 point ROS is otherwise negative    Objective:   Physical Exam BP 125/80  Pulse 82  Temp(Src) 97.6 F (36.4 C) (Oral) Gen= a xo by 3 in NAD, wheelchair bound HEENT = [place]/AT, PERRLA, no thrush Back/skin = small 1.5cm opening good granulation tissue, able to place head of Qtip into wound. 1.2cm deep. + blood on dressing, no purulent discharge no erythema in surrounding area      Assessment & Plan:  Perinephric abscess = continue with amoxicillin 500mg  TID and metronidazole 500mg  TID. Recommend to crush metronidazole to improve ease of swallowing. We will  see her back in 4 wks and coordinate with Dr. Loyal Jacobson when is is best to repeat iamging to decide when to stop antibiotics. 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 Once the fistula track is healed, can stop antibiotics. Continue with daily wet to dray packing of wound. It has healed considerably by other reports

## 2012-12-31 ENCOUNTER — Encounter: Payer: Self-pay | Admitting: Urology

## 2013-01-27 ENCOUNTER — Ambulatory Visit: Payer: Managed Care, Other (non HMO) | Admitting: Internal Medicine

## 2013-02-01 ENCOUNTER — Ambulatory Visit: Payer: Managed Care, Other (non HMO) | Admitting: Internal Medicine

## 2013-02-03 ENCOUNTER — Ambulatory Visit: Payer: Managed Care, Other (non HMO) | Admitting: Internal Medicine

## 2013-02-08 ENCOUNTER — Ambulatory Visit (INDEPENDENT_AMBULATORY_CARE_PROVIDER_SITE_OTHER): Payer: Managed Care, Other (non HMO) | Admitting: Internal Medicine

## 2013-02-08 ENCOUNTER — Encounter: Payer: Self-pay | Admitting: Internal Medicine

## 2013-02-08 VITALS — BP 169/92 | HR 68 | Temp 98.2°F | Ht 62.0 in | Wt 240.0 lb

## 2013-02-08 DIAGNOSIS — L02211 Cutaneous abscess of abdominal wall: Secondary | ICD-10-CM

## 2013-02-08 DIAGNOSIS — L02219 Cutaneous abscess of trunk, unspecified: Secondary | ICD-10-CM

## 2013-02-08 NOTE — Progress Notes (Signed)
Patient ID: [name], female   DOB: [birth year], 52 y.o.   MRN: [id number]         [facility]  Patient Active Problem List   Diagnosis Date Noted  . Abscess of flank 12/16/2012  . Nephrolithiasis 12/16/2012  . Diarrhea 12/16/2012  . HTN (hypertension) 12/03/2012  . DECUBITUS ULCER 08/20/2006    Patient's Medications  New Prescriptions   No medications on file  Previous Medications   AMLODIPINE (NORVASC) 10 MG TABLET    Take 1 tablet (10 mg total) by mouth daily.   HYDROCODONE-ACETAMINOPHEN (NORCO/VICODIN) 5-325 MG PER TABLET    Take 2 tablets by mouth every 4 (four) hours as needed for pain.   POLYETHYLENE GLYCOL (MIRALAX / GLYCOLAX) PACKET    Take 17 g by mouth daily.   SENNA-DOCUSATE (SENOKOT-S) 8.6-50 MG PER TABLET    Take 1 tablet by mouth 2 (two) times daily.  Modified Medications   No medications on file  Discontinued Medications   AMOXICILLIN (AMOXIL) 500 MG CAPSULE    Take 1 capsule (500 mg total) by mouth 3 (three) times daily.   METRONIDAZOLE (FLAGYL) 500 MG TABLET    Take 1 tablet (500 mg total) by mouth 3 (three) times daily.    Subjective: [name] is in for her routine visit. She completed 6 weeks of antibiotic therapy for her polymicrobic right flank abscess 3 weeks ago. She is feeling much better. Review of Systems: Pertinent items are noted in HPI.  Past Medical History  Diagnosis Date  . Amputation, leg, bilateral, traumatic   . Paralysis   . Kidney stone   . Hypertension 09/2011    had in hospital  . Blood transfusion     several over yrs.    History  Substance Use Topics  . Smoking status: Former Smoker -- 0 years    Types: Cigarettes    Quit date: 11/04/2001  . Smokeless tobacco: Never Used  . Alcohol Use: No    No family history on file.  Allergies  Allergen Reactions  . Iohexol Rash    Objective: Temp: 98.2 F (36.8 C) (08/19 1338) Temp src: Oral (08/19 1338) BP: 169/92 mmHg (08/19 1338) Pulse  Rate: 68 (08/19 1338)  General: She is in no distress seated in her electric wheelchair Abdomen: Soft and obese Right flank: Her open wound has healed. There is no change in her large lumbar hernia.   Assessment: She's responded nicely to antibiotic therapy for her polymicrobial right flank abscess. She does not want to undergo a followup scan at this time. She says she is allergic to dye but she had an MRI with contrast in June without any difficulty. She is also reluctant to have further procedures that would require IV sticks.  Plan: 1. Continue observation off of antibiotics 2. Followup in one month   Cliffton Asters, MD [facility] Infect[facility] Disease [facility] [phone] pager   [phone] cell 02/08/2013, 1:55 PM

## 2013-02-22 ENCOUNTER — Other Ambulatory Visit (HOSPITAL_COMMUNITY): Payer: Self-pay | Admitting: Urology

## 2013-02-22 DIAGNOSIS — N151 Renal and perinephric abscess: Secondary | ICD-10-CM

## 2013-03-08 ENCOUNTER — Ambulatory Visit: Payer: Managed Care, Other (non HMO) | Admitting: Internal Medicine

## 2013-03-08 ENCOUNTER — Telehealth: Payer: Self-pay | Admitting: *Deleted

## 2013-03-08 NOTE — Telephone Encounter (Signed)
Unable to contact pt re:  No Show appt.

## 2013-04-28 ENCOUNTER — Other Ambulatory Visit: Payer: Self-pay

## 2013-09-05 ENCOUNTER — Emergency Department (HOSPITAL_COMMUNITY): Payer: Managed Care, Other (non HMO)

## 2013-09-05 ENCOUNTER — Encounter (HOSPITAL_COMMUNITY): Payer: Self-pay | Admitting: Emergency Medicine

## 2013-09-05 ENCOUNTER — Emergency Department (HOSPITAL_COMMUNITY)
Admission: EM | Admit: 2013-09-05 | Discharge: 2013-09-05 | Disposition: A | Discharge status: Home / Self Care | Payer: Managed Care, Other (non HMO) | Attending: Emergency Medicine | Admitting: Emergency Medicine

## 2013-09-05 DIAGNOSIS — Z87891 Personal history of nicotine dependence: Secondary | ICD-10-CM | POA: Insufficient documentation

## 2013-09-05 DIAGNOSIS — S88919A Complete traumatic amputation of unspecified lower leg, level unspecified, initial encounter: Secondary | ICD-10-CM | POA: Insufficient documentation

## 2013-09-05 DIAGNOSIS — I10 Essential (primary) hypertension: Secondary | ICD-10-CM | POA: Insufficient documentation

## 2013-09-05 DIAGNOSIS — J40 Bronchitis, not specified as acute or chronic: Secondary | ICD-10-CM

## 2013-09-05 DIAGNOSIS — Z79899 Other long term (current) drug therapy: Secondary | ICD-10-CM | POA: Insufficient documentation

## 2013-09-05 DIAGNOSIS — Z936 Other artificial openings of urinary tract status: Secondary | ICD-10-CM | POA: Insufficient documentation

## 2013-09-05 DIAGNOSIS — Z87442 Personal history of urinary calculi: Secondary | ICD-10-CM | POA: Insufficient documentation

## 2013-09-05 DIAGNOSIS — J209 Acute bronchitis, unspecified: Secondary | ICD-10-CM | POA: Insufficient documentation

## 2013-09-05 DIAGNOSIS — N390 Urinary tract infection, site not specified: Secondary | ICD-10-CM

## 2013-09-05 DIAGNOSIS — Z8669 Personal history of other diseases of the nervous system and sense organs: Secondary | ICD-10-CM | POA: Insufficient documentation

## 2013-09-05 DIAGNOSIS — R109 Unspecified abdominal pain: Secondary | ICD-10-CM | POA: Insufficient documentation

## 2013-09-05 LAB — CBC WITH DIFFERENTIAL/PLATELET
BASOS ABS: 0 10*3/uL (ref 0.0–0.1)
Basophils Relative: 1 % (ref 0–1)
EOS ABS: 0.2 10*3/uL (ref 0.0–0.7)
EOS PCT: 5 % (ref 0–5)
HCT: 37.7 % (ref 36.0–46.0)
Hemoglobin: 12.1 g/dL (ref 12.0–15.0)
Lymphocytes Relative: 20 % (ref 12–46)
Lymphs Abs: 0.9 10*3/uL (ref 0.7–4.0)
MCH: 27.1 pg (ref 26.0–34.0)
MCHC: 32.1 g/dL (ref 30.0–36.0)
MCV: 84.5 fL (ref 78.0–100.0)
Monocytes Absolute: 0.6 10*3/uL (ref 0.1–1.0)
Monocytes Relative: 12 % (ref 3–12)
NEUTROS PCT: 62 % (ref 43–77)
Neutro Abs: 2.9 10*3/uL (ref 1.7–7.7)
PLATELETS: 334 10*3/uL (ref 150–400)
RBC: 4.46 MIL/uL (ref 3.87–5.11)
RDW: 14.5 % (ref 11.5–15.5)
WBC: 4.6 10*3/uL (ref 4.0–10.5)

## 2013-09-05 LAB — BASIC METABOLIC PANEL
BUN: 9 mg/dL (ref 6–23)
CALCIUM: 8.9 mg/dL (ref 8.4–10.5)
CO2: 28 mEq/L (ref 19–32)
Chloride: 99 mEq/L (ref 96–112)
Creatinine, Ser: 0.71 mg/dL (ref 0.50–1.10)
Glucose, Bld: 88 mg/dL (ref 70–99)
Potassium: 3.3 mEq/L — ABNORMAL LOW (ref 3.7–5.3)
SODIUM: 140 meq/L (ref 137–147)

## 2013-09-05 LAB — URINALYSIS, ROUTINE W REFLEX MICROSCOPIC
BILIRUBIN URINE: NEGATIVE
Glucose, UA: NEGATIVE mg/dL
Ketones, ur: 15 mg/dL — AB
NITRITE: NEGATIVE
PROTEIN: 100 mg/dL — AB
Specific Gravity, Urine: 1.009 (ref 1.005–1.030)
UROBILINOGEN UA: 1 mg/dL (ref 0.0–1.0)
pH: 7.5 (ref 5.0–8.0)

## 2013-09-05 LAB — URINE MICROSCOPIC-ADD ON

## 2013-09-05 MED ORDER — MORPHINE SULFATE 4 MG/ML IJ SOLN
6.0000 mg | Freq: Once | INTRAMUSCULAR | Status: AC
  Administered 2013-09-05: 6 mg via INTRAVENOUS
  Filled 2013-09-05: qty 2

## 2013-09-05 MED ORDER — OXYCODONE-ACETAMINOPHEN 5-325 MG PO TABS
1.0000 | ORAL_TABLET | Freq: Once | ORAL | Status: AC
  Administered 2013-09-05: 1 via ORAL
  Filled 2013-09-05: qty 1

## 2013-09-05 MED ORDER — LEVOFLOXACIN 500 MG PO TABS
500.0000 mg | ORAL_TABLET | Freq: Once | ORAL | Status: DC
  Filled 2013-09-05: qty 1

## 2013-09-05 MED ORDER — LEVOFLOXACIN 500 MG PO TABS: 500.0000 mg | ORAL_TABLET | Freq: Every day | ORAL | Status: DC

## 2013-09-05 MED ORDER — HEPARIN SOD (PORK) LOCK FLUSH 100 UNIT/ML IV SOLN
500.0000 [IU] | Freq: Once | INTRAVENOUS | Status: AC
  Administered 2013-09-05: 500 [IU]
  Filled 2013-09-05: qty 5

## 2013-09-05 MED ORDER — DEXTROSE 5 % IV SOLN
1.0000 g | Freq: Once | INTRAVENOUS | Status: AC
  Administered 2013-09-05: 1 g via INTRAVENOUS
  Filled 2013-09-05: qty 10

## 2013-09-05 MED ORDER — CEPHALEXIN 500 MG PO CAPS: 500.0000 mg | ORAL_CAPSULE | Freq: Four times a day (QID) | ORAL | Status: DC

## 2013-09-05 MED ORDER — ONDANSETRON HCL 4 MG/2ML IJ SOLN
INTRAMUSCULAR | Status: AC
  Filled 2013-09-05: qty 2

## 2013-09-05 MED ORDER — ONDANSETRON HCL 4 MG/2ML IJ SOLN
4.0000 mg | Freq: Once | INTRAMUSCULAR | Status: AC
  Administered 2013-09-05: 4 mg via INTRAVENOUS
  Filled 2013-09-05: qty 2

## 2013-09-05 MED ORDER — HYDROCODONE-ACETAMINOPHEN 7.5-325 MG/15ML PO SOLN: 15.0000 mL | Freq: Four times a day (QID) | ORAL | Status: DC | PRN
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
## 2013-09-05 NOTE — ED Notes (Signed)
EKG performed because patient complains of left upper back pain radiating into shoulder with nausea.

## 2013-09-05 NOTE — ED Notes (Signed)
Bed: WA20 Expected date:  Expected time:  Means of arrival:  Comments: ems kidney stone

## 2013-09-05 NOTE — ED Notes (Signed)
Pt has PICC line to right chest

## 2013-09-05 NOTE — ED Notes (Signed)
Patient refused to take order levaquin because her doctor is going to decide which antibiotic she should be taking.

## 2014-04-07 ENCOUNTER — Other Ambulatory Visit: Payer: Self-pay

## 2014-05-31 ENCOUNTER — Emergency Department (HOSPITAL_COMMUNITY): Payer: Managed Care, Other (non HMO)

## 2014-05-31 ENCOUNTER — Inpatient Hospital Stay (HOSPITAL_COMMUNITY)
Admission: EM | Admit: 2014-05-31 | Discharge: 2014-06-10 | DRG: 871 | Disposition: A | Discharge status: Home Health | Payer: Managed Care, Other (non HMO) | Attending: Internal Medicine | Admitting: Internal Medicine

## 2014-05-31 ENCOUNTER — Encounter (HOSPITAL_COMMUNITY): Payer: Self-pay

## 2014-05-31 DIAGNOSIS — D649 Anemia, unspecified: Secondary | ICD-10-CM | POA: Diagnosis present

## 2014-05-31 DIAGNOSIS — R509 Fever, unspecified: Secondary | ICD-10-CM

## 2014-05-31 DIAGNOSIS — G8929 Other chronic pain: Secondary | ICD-10-CM | POA: Diagnosis present

## 2014-05-31 DIAGNOSIS — A419 Sepsis, unspecified organism: Secondary | ICD-10-CM | POA: Diagnosis not present

## 2014-05-31 DIAGNOSIS — R0902 Hypoxemia: Secondary | ICD-10-CM | POA: Diagnosis not present

## 2014-05-31 DIAGNOSIS — G4733 Obstructive sleep apnea (adult) (pediatric): Secondary | ICD-10-CM | POA: Diagnosis present

## 2014-05-31 DIAGNOSIS — R911 Solitary pulmonary nodule: Secondary | ICD-10-CM | POA: Diagnosis present

## 2014-05-31 DIAGNOSIS — Z89611 Acquired absence of right leg above knee: Secondary | ICD-10-CM

## 2014-05-31 DIAGNOSIS — D696 Thrombocytopenia, unspecified: Secondary | ICD-10-CM | POA: Diagnosis not present

## 2014-05-31 DIAGNOSIS — Z87440 Personal history of urinary (tract) infections: Secondary | ICD-10-CM

## 2014-05-31 DIAGNOSIS — K439 Ventral hernia without obstruction or gangrene: Secondary | ICD-10-CM | POA: Diagnosis present

## 2014-05-31 DIAGNOSIS — D638 Anemia in other chronic diseases classified elsewhere: Secondary | ICD-10-CM | POA: Diagnosis present

## 2014-05-31 DIAGNOSIS — J9811 Atelectasis: Secondary | ICD-10-CM | POA: Diagnosis present

## 2014-05-31 DIAGNOSIS — Z7901 Long term (current) use of anticoagulants: Secondary | ICD-10-CM

## 2014-05-31 DIAGNOSIS — Z452 Encounter for adjustment and management of vascular access device: Secondary | ICD-10-CM

## 2014-05-31 DIAGNOSIS — Z87891 Personal history of nicotine dependence: Secondary | ICD-10-CM

## 2014-05-31 DIAGNOSIS — N200 Calculus of kidney: Secondary | ICD-10-CM

## 2014-05-31 DIAGNOSIS — E669 Obesity, unspecified: Secondary | ICD-10-CM | POA: Diagnosis present

## 2014-05-31 DIAGNOSIS — Z86711 Personal history of pulmonary embolism: Secondary | ICD-10-CM

## 2014-05-31 DIAGNOSIS — I480 Paroxysmal atrial fibrillation: Secondary | ICD-10-CM | POA: Diagnosis present

## 2014-05-31 DIAGNOSIS — G8220 Paraplegia, unspecified: Secondary | ICD-10-CM | POA: Diagnosis present

## 2014-05-31 DIAGNOSIS — N390 Urinary tract infection, site not specified: Secondary | ICD-10-CM | POA: Diagnosis present

## 2014-05-31 DIAGNOSIS — R6521 Severe sepsis with septic shock: Secondary | ICD-10-CM | POA: Diagnosis present

## 2014-05-31 DIAGNOSIS — L309 Dermatitis, unspecified: Secondary | ICD-10-CM | POA: Diagnosis present

## 2014-05-31 DIAGNOSIS — E869 Volume depletion, unspecified: Secondary | ICD-10-CM | POA: Diagnosis present

## 2014-05-31 DIAGNOSIS — Z87442 Personal history of urinary calculi: Secondary | ICD-10-CM

## 2014-05-31 DIAGNOSIS — Z993 Dependence on wheelchair: Secondary | ICD-10-CM

## 2014-05-31 DIAGNOSIS — I10 Essential (primary) hypertension: Secondary | ICD-10-CM | POA: Diagnosis present

## 2014-05-31 DIAGNOSIS — IMO0001 Reserved for inherently not codable concepts without codable children: Secondary | ICD-10-CM | POA: Insufficient documentation

## 2014-05-31 DIAGNOSIS — Z6841 Body Mass Index (BMI) 40.0 and over, adult: Secondary | ICD-10-CM

## 2014-05-31 DIAGNOSIS — I517 Cardiomegaly: Secondary | ICD-10-CM | POA: Diagnosis present

## 2014-05-31 DIAGNOSIS — I2699 Other pulmonary embolism without acute cor pulmonale: Secondary | ICD-10-CM

## 2014-05-31 DIAGNOSIS — K5900 Constipation, unspecified: Secondary | ICD-10-CM | POA: Diagnosis not present

## 2014-05-31 DIAGNOSIS — I82411 Acute embolism and thrombosis of right femoral vein: Secondary | ICD-10-CM | POA: Diagnosis present

## 2014-05-31 DIAGNOSIS — Z91041 Radiographic dye allergy status: Secondary | ICD-10-CM

## 2014-05-31 DIAGNOSIS — E876 Hypokalemia: Secondary | ICD-10-CM | POA: Diagnosis present

## 2014-05-31 DIAGNOSIS — R6510 Systemic inflammatory response syndrome (SIRS) of non-infectious origin without acute organ dysfunction: Secondary | ICD-10-CM

## 2014-05-31 DIAGNOSIS — E872 Acidosis: Secondary | ICD-10-CM | POA: Diagnosis not present

## 2014-05-31 DIAGNOSIS — Z8249 Family history of ischemic heart disease and other diseases of the circulatory system: Secondary | ICD-10-CM

## 2014-05-31 DIAGNOSIS — Z89612 Acquired absence of left leg above knee: Secondary | ICD-10-CM

## 2014-05-31 LAB — COMPREHENSIVE METABOLIC PANEL
ALBUMIN: 2.5 g/dL — AB (ref 3.5–5.2)
ALK PHOS: 172 U/L — AB (ref 39–117)
ALT: 8 U/L (ref 0–35)
AST: 12 U/L (ref 0–37)
Anion gap: 16 — ABNORMAL HIGH (ref 5–15)
BUN: 18 mg/dL (ref 6–23)
CO2: 21 mEq/L (ref 19–32)
Calcium: 9.3 mg/dL (ref 8.4–10.5)
Chloride: 98 mEq/L (ref 96–112)
Creatinine, Ser: 0.76 mg/dL (ref 0.50–1.10)
GFR calc Af Amer: >90 mL/min (ref >90)
GFR calc non Af Amer: >90 mL/min (ref >90)
GLUCOSE: 104 mg/dL — AB (ref 70–99)
POTASSIUM: 3.7 meq/L (ref 3.7–5.3)
Sodium: 135 mEq/L — ABNORMAL LOW (ref 137–147)
TOTAL PROTEIN: 7.5 g/dL (ref 6.0–8.3)
Total Bilirubin: 3.3 mg/dL — ABNORMAL HIGH (ref 0.3–1.2)

## 2014-05-31 LAB — CBC WITH DIFFERENTIAL/PLATELET
BASOS PCT: 0 % (ref 0–1)
Basophils Absolute: 0 10*3/uL (ref 0.0–0.1)
EOS ABS: 0.2 10*3/uL (ref 0.0–0.7)
Eosinophils Relative: 2 % (ref 0–5)
HCT: 33.4 % — ABNORMAL LOW (ref 36.0–46.0)
Hemoglobin: 10.1 g/dL — ABNORMAL LOW (ref 12.0–15.0)
LYMPHS ABS: 1.2 10*3/uL (ref 0.7–4.0)
Lymphocytes Relative: 11 % — ABNORMAL LOW (ref 12–46)
MCH: 25.1 pg — ABNORMAL LOW (ref 26.0–34.0)
MCHC: 30.2 g/dL (ref 30.0–36.0)
MCV: 82.9 fL (ref 78.0–100.0)
Monocytes Absolute: 1.1 10*3/uL — ABNORMAL HIGH (ref 0.1–1.0)
Monocytes Relative: 10 % (ref 3–12)
NEUTROS PCT: 77 % (ref 43–77)
Neutro Abs: 9 10*3/uL — ABNORMAL HIGH (ref 1.7–7.7)
PLATELETS: 411 10*3/uL — AB (ref 150–400)
RBC: 4.03 MIL/uL (ref 3.87–5.11)
RDW: 15.9 % — ABNORMAL HIGH (ref 11.5–15.5)
WBC: 11.6 10*3/uL — ABNORMAL HIGH (ref 4.0–10.5)

## 2014-05-31 LAB — URINALYSIS, ROUTINE W REFLEX MICROSCOPIC
BILIRUBIN URINE: NEGATIVE
GLUCOSE, UA: NEGATIVE mg/dL
KETONES UR: 15 mg/dL — AB
Nitrite: NEGATIVE
PH: 8 (ref 5.0–8.0)
Protein, ur: 30 mg/dL — AB
Specific Gravity, Urine: 1.009 (ref 1.005–1.030)
Urobilinogen, UA: 1 mg/dL (ref 0.0–1.0)

## 2014-05-31 LAB — URINE MICROSCOPIC-ADD ON

## 2014-05-31 MED ORDER — CIPROFLOXACIN IN D5W 400 MG/200ML IV SOLN
400.0000 mg | Freq: Once | INTRAVENOUS | Status: AC
  Administered 2014-05-31: 400 mg via INTRAVENOUS
  Filled 2014-05-31: qty 200

## 2014-05-31 MED ORDER — ACETAMINOPHEN 500 MG PO TABS
1000.0000 mg | ORAL_TABLET | Freq: Once | ORAL | Status: AC
  Administered 2014-05-31: 1000 mg via ORAL
  Filled 2014-05-31: qty 2

## 2014-05-31 MED ORDER — DEXTROSE 5 % IV SOLN
1.0000 g | Freq: Once | INTRAVENOUS | Status: AC
  Administered 2014-06-01: 1 g via INTRAVENOUS
  Filled 2014-05-31: qty 1

## 2014-05-31 MED ORDER — SODIUM CHLORIDE 0.9 % IV BOLUS (SEPSIS)
500.0000 mL | Freq: Once | INTRAVENOUS | Status: AC
  Administered 2014-05-31: 500 mL via INTRAVENOUS
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
## 2014-05-31 NOTE — ED Notes (Signed)
Catheter bag has been changed per patient. Unable to pull bag urine at this time. Will attempt again shortly.

## 2014-05-31 NOTE — ED Notes (Signed)
Bed: WA17 Expected date:  Expected time:  Means of arrival:  Comments: Ems- double amputee, UTI, fever

## 2014-05-31 NOTE — ED Notes (Signed)
Per EMS- Pt c/o of UTI with fever and body aches. DX with UTI 3 days ago and started on antibiotic 3 days ago. Reports no change in symptoms. Denies N/V/D fever 100.8. And blood in urine. IV attempted without success. Pt 91% on RA. Hyperventilating on scene. 96% on 2l . NAD.

## 2014-05-31 NOTE — ED Notes (Addendum)
Per RN Tresa EndoKelly, patient wouldn't let her change catheter bag until an IV was obtained. IV has been obtained, will communicate with nurse about urine

## 2014-06-01 ENCOUNTER — Inpatient Hospital Stay (HOSPITAL_COMMUNITY): Payer: Managed Care, Other (non HMO)

## 2014-06-01 ENCOUNTER — Emergency Department (HOSPITAL_COMMUNITY): Payer: Managed Care, Other (non HMO)

## 2014-06-01 ENCOUNTER — Encounter (HOSPITAL_COMMUNITY): Payer: Self-pay | Admitting: Internal Medicine

## 2014-06-01 DIAGNOSIS — G8220 Paraplegia, unspecified: Secondary | ICD-10-CM | POA: Diagnosis present

## 2014-06-01 DIAGNOSIS — A419 Sepsis, unspecified organism: Principal | ICD-10-CM

## 2014-06-01 DIAGNOSIS — E876 Hypokalemia: Secondary | ICD-10-CM | POA: Diagnosis present

## 2014-06-01 DIAGNOSIS — D649 Anemia, unspecified: Secondary | ICD-10-CM

## 2014-06-01 DIAGNOSIS — Z87440 Personal history of urinary (tract) infections: Secondary | ICD-10-CM | POA: Diagnosis not present

## 2014-06-01 DIAGNOSIS — Z87891 Personal history of nicotine dependence: Secondary | ICD-10-CM | POA: Diagnosis not present

## 2014-06-01 DIAGNOSIS — Z91041 Radiographic dye allergy status: Secondary | ICD-10-CM | POA: Diagnosis not present

## 2014-06-01 DIAGNOSIS — Z87442 Personal history of urinary calculi: Secondary | ICD-10-CM | POA: Diagnosis not present

## 2014-06-01 DIAGNOSIS — N200 Calculus of kidney: Secondary | ICD-10-CM | POA: Diagnosis present

## 2014-06-01 DIAGNOSIS — Z993 Dependence on wheelchair: Secondary | ICD-10-CM | POA: Diagnosis not present

## 2014-06-01 DIAGNOSIS — E869 Volume depletion, unspecified: Secondary | ICD-10-CM | POA: Diagnosis present

## 2014-06-01 DIAGNOSIS — Z7901 Long term (current) use of anticoagulants: Secondary | ICD-10-CM | POA: Diagnosis not present

## 2014-06-01 DIAGNOSIS — Z8249 Family history of ischemic heart disease and other diseases of the circulatory system: Secondary | ICD-10-CM | POA: Diagnosis not present

## 2014-06-01 DIAGNOSIS — N390 Urinary tract infection, site not specified: Secondary | ICD-10-CM | POA: Diagnosis present

## 2014-06-01 DIAGNOSIS — D696 Thrombocytopenia, unspecified: Secondary | ICD-10-CM | POA: Diagnosis not present

## 2014-06-01 DIAGNOSIS — D638 Anemia in other chronic diseases classified elsewhere: Secondary | ICD-10-CM | POA: Diagnosis present

## 2014-06-01 DIAGNOSIS — R0902 Hypoxemia: Secondary | ICD-10-CM | POA: Diagnosis not present

## 2014-06-01 DIAGNOSIS — R6521 Severe sepsis with septic shock: Secondary | ICD-10-CM

## 2014-06-01 DIAGNOSIS — G4733 Obstructive sleep apnea (adult) (pediatric): Secondary | ICD-10-CM | POA: Diagnosis present

## 2014-06-01 DIAGNOSIS — G8929 Other chronic pain: Secondary | ICD-10-CM | POA: Diagnosis present

## 2014-06-01 DIAGNOSIS — I10 Essential (primary) hypertension: Secondary | ICD-10-CM

## 2014-06-01 DIAGNOSIS — L309 Dermatitis, unspecified: Secondary | ICD-10-CM | POA: Diagnosis present

## 2014-06-01 DIAGNOSIS — I82411 Acute embolism and thrombosis of right femoral vein: Secondary | ICD-10-CM | POA: Diagnosis present

## 2014-06-01 DIAGNOSIS — Z89611 Acquired absence of right leg above knee: Secondary | ICD-10-CM | POA: Diagnosis not present

## 2014-06-01 DIAGNOSIS — I517 Cardiomegaly: Secondary | ICD-10-CM | POA: Diagnosis present

## 2014-06-01 DIAGNOSIS — Z86711 Personal history of pulmonary embolism: Secondary | ICD-10-CM | POA: Diagnosis not present

## 2014-06-01 DIAGNOSIS — E872 Acidosis: Secondary | ICD-10-CM | POA: Diagnosis not present

## 2014-06-01 DIAGNOSIS — K439 Ventral hernia without obstruction or gangrene: Secondary | ICD-10-CM | POA: Diagnosis present

## 2014-06-01 DIAGNOSIS — J9811 Atelectasis: Secondary | ICD-10-CM | POA: Diagnosis present

## 2014-06-01 DIAGNOSIS — E669 Obesity, unspecified: Secondary | ICD-10-CM | POA: Diagnosis present

## 2014-06-01 DIAGNOSIS — I480 Paroxysmal atrial fibrillation: Secondary | ICD-10-CM

## 2014-06-01 DIAGNOSIS — Z89612 Acquired absence of left leg above knee: Secondary | ICD-10-CM | POA: Diagnosis not present

## 2014-06-01 DIAGNOSIS — R911 Solitary pulmonary nodule: Secondary | ICD-10-CM | POA: Diagnosis present

## 2014-06-01 DIAGNOSIS — Z6841 Body Mass Index (BMI) 40.0 and over, adult: Secondary | ICD-10-CM | POA: Diagnosis not present

## 2014-06-01 DIAGNOSIS — K5900 Constipation, unspecified: Secondary | ICD-10-CM | POA: Diagnosis not present

## 2014-06-01 LAB — CBC
HEMATOCRIT: 32.6 % — AB (ref 36.0–46.0)
Hemoglobin: 9.9 g/dL — ABNORMAL LOW (ref 12.0–15.0)
MCH: 25.5 pg — ABNORMAL LOW (ref 26.0–34.0)
MCHC: 30.4 g/dL (ref 30.0–36.0)
MCV: 84 fL (ref 78.0–100.0)
Platelets: 388 10*3/uL (ref 150–400)
RBC: 3.88 MIL/uL (ref 3.87–5.11)
RDW: 16 % — ABNORMAL HIGH (ref 11.5–15.5)
WBC: 11.9 10*3/uL — ABNORMAL HIGH (ref 4.0–10.5)

## 2014-06-01 LAB — URINE CULTURE: Colony Count: >=100000

## 2014-06-01 LAB — TROPONIN I
Troponin I: <0.3 ng/mL (ref <0.30)
Troponin I: 0.36 ng/mL (ref <0.30)

## 2014-06-01 LAB — COMPREHENSIVE METABOLIC PANEL
ALK PHOS: 153 U/L — AB (ref 39–117)
ALT: 7 U/L (ref 0–35)
AST: 9 U/L (ref 0–37)
Albumin: 2.4 g/dL — ABNORMAL LOW (ref 3.5–5.2)
Anion gap: 13 (ref 5–15)
BUN: 20 mg/dL (ref 6–23)
CO2: 23 mEq/L (ref 19–32)
Calcium: 9.1 mg/dL (ref 8.4–10.5)
Chloride: 104 mEq/L (ref 96–112)
Creatinine, Ser: 0.77 mg/dL (ref 0.50–1.10)
GFR calc Af Amer: >90 mL/min (ref >90)
GFR calc non Af Amer: >90 mL/min (ref >90)
GLUCOSE: 103 mg/dL — AB (ref 70–99)
POTASSIUM: 3.5 meq/L — AB (ref 3.7–5.3)
SODIUM: 140 meq/L (ref 137–147)
Total Bilirubin: 4 mg/dL — ABNORMAL HIGH (ref 0.3–1.2)
Total Protein: 6.9 g/dL (ref 6.0–8.3)

## 2014-06-01 LAB — TSH: TSH: 1.06 u[IU]/mL (ref 0.350–4.500)

## 2014-06-01 LAB — CARBOXYHEMOGLOBIN
Carboxyhemoglobin: 3.2 % — ABNORMAL HIGH (ref 0.5–1.5)
Carboxyhemoglobin: 2.8 % — ABNORMAL HIGH (ref 0.5–1.5)
METHEMOGLOBIN: 1.9 % — AB (ref 0.0–1.5)
METHEMOGLOBIN: 2 % — AB (ref 0.0–1.5)
O2 Saturation: 63.1 %
O2 Saturation: 64.3 %
TOTAL HEMOGLOBIN: 10 g/dL — AB (ref 12.0–16.0)
TOTAL HEMOGLOBIN: 10.4 g/dL — AB (ref 12.0–16.0)

## 2014-06-01 LAB — D-DIMER, QUANTITATIVE: D-Dimer, Quant: >20 ug/mL-FEU — ABNORMAL HIGH (ref 0.00–0.48)

## 2014-06-01 LAB — LACTIC ACID, PLASMA
LACTIC ACID, VENOUS: 0.6 mmol/L (ref 0.5–2.2)
LACTIC ACID, VENOUS: 0.9 mmol/L (ref 0.5–2.2)
Lactic Acid, Venous: 1 mmol/L (ref 0.5–2.2)

## 2014-06-01 LAB — PROCALCITONIN: Procalcitonin: 0.21 ng/mL

## 2014-06-01 LAB — PHOSPHORUS: Phosphorus: 2.1 mg/dL — ABNORMAL LOW (ref 2.3–4.6)

## 2014-06-01 LAB — MAGNESIUM: Magnesium: 2 mg/dL (ref 1.5–2.5)

## 2014-06-01 MED ORDER — HEPARIN BOLUS VIA INFUSION
2500.0000 [IU] | Freq: Once | INTRAVENOUS | Status: AC
  Administered 2014-06-01: 2500 [IU] via INTRAVENOUS
  Filled 2014-06-01: qty 2500

## 2014-06-01 MED ORDER — MORPHINE SULFATE 2 MG/ML IJ SOLN
2.0000 mg | INTRAMUSCULAR | Status: DC | PRN
  Administered 2014-06-01 – 2014-06-09 (×15): 2 mg via INTRAVENOUS
  Filled 2014-06-01 (×17): qty 1

## 2014-06-01 MED ORDER — HYDROCODONE-ACETAMINOPHEN 7.5-325 MG/15ML PO SOLN: 15.0000 mL | Freq: Four times a day (QID) | ORAL | Status: DC | PRN

## 2014-06-01 MED ORDER — VANCOMYCIN HCL IN DEXTROSE 1-5 GM/200ML-% IV SOLN
1000.0000 mg | Freq: Three times a day (TID) | INTRAVENOUS | Status: DC
  Administered 2014-06-01 – 2014-06-02 (×4): 1000 mg via INTRAVENOUS
  Filled 2014-06-01 (×3): qty 200

## 2014-06-01 MED ORDER — DILTIAZEM HCL 25 MG/5ML IV SOLN
5.0000 mg | Freq: Once | INTRAVENOUS | Status: AC
  Administered 2014-06-01: 5 mg via INTRAVENOUS
  Filled 2014-06-01: qty 5

## 2014-06-01 MED ORDER — MORPHINE SULFATE 2 MG/ML IJ SOLN
1.0000 mg | INTRAMUSCULAR | Status: DC | PRN
  Administered 2014-06-01: 1 mg via INTRAVENOUS
  Filled 2014-06-01: qty 1

## 2014-06-01 MED ORDER — SENNOSIDES-DOCUSATE SODIUM 8.6-50 MG PO TABS
1.0000 | ORAL_TABLET | Freq: Two times a day (BID) | ORAL | Status: DC
  Filled 2014-06-01: qty 1

## 2014-06-01 MED ORDER — HEPARIN (PORCINE) IN NACL 100-0.45 UNIT/ML-% IJ SOLN
1250.0000 [IU]/h | INTRAMUSCULAR | Status: DC
  Administered 2014-06-02: 1250 [IU]/h via INTRAVENOUS
  Filled 2014-06-01 (×2): qty 250

## 2014-06-01 MED ORDER — SODIUM CHLORIDE 0.9 % IV BOLUS (SEPSIS)
1500.0000 mL | Freq: Once | INTRAVENOUS | Status: AC
  Administered 2014-06-01: 1000 mL via INTRAVENOUS

## 2014-06-01 MED ORDER — DEXTROSE 5 % IV SOLN
1.0000 g | Freq: Three times a day (TID) | INTRAVENOUS | Status: DC
  Administered 2014-06-01 – 2014-06-05 (×13): 1 g via INTRAVENOUS
  Filled 2014-06-01 (×15): qty 1

## 2014-06-01 MED ORDER — FOLIC ACID 1 MG PO TABS
1.0000 mg | ORAL_TABLET | Freq: Every day | ORAL | Status: DC
  Filled 2014-06-01: qty 1

## 2014-06-01 MED ORDER — PANTOPRAZOLE SODIUM 40 MG PO TBEC
40.0000 mg | DELAYED_RELEASE_TABLET | Freq: Every day | ORAL | Status: DC
  Administered 2014-06-01 – 2014-06-08 (×4): 40 mg via ORAL
  Filled 2014-06-01 (×9): qty 1

## 2014-06-01 MED ORDER — ACETAMINOPHEN 650 MG RE SUPP: 650.0000 mg | Freq: Four times a day (QID) | RECTAL | Status: DC | PRN

## 2014-06-01 MED ORDER — SODIUM CHLORIDE 0.9 % IV SOLN
INTRAVENOUS | Status: AC
  Administered 2014-06-01: 03:00:00 via INTRAVENOUS

## 2014-06-01 MED ORDER — MIDAZOLAM HCL 2 MG/2ML IJ SOLN
0.5000 mg | Freq: Once | INTRAMUSCULAR | Status: AC
  Administered 2014-06-01: 0.5 mg via INTRAVENOUS

## 2014-06-01 MED ORDER — POTASSIUM CHLORIDE 10 MEQ/100ML IV SOLN
10.0000 meq | INTRAVENOUS | Status: AC
  Administered 2014-06-01 (×2): 10 meq via INTRAVENOUS
  Filled 2014-06-01: qty 100

## 2014-06-01 MED ORDER — ASPIRIN 81 MG PO CHEW
324.0000 mg | CHEWABLE_TABLET | Freq: Once | ORAL | Status: AC
  Administered 2014-06-01: 324 mg via ORAL
  Filled 2014-06-01: qty 4

## 2014-06-01 MED ORDER — VITAMIN B-1 100 MG PO TABS
100.0000 mg | ORAL_TABLET | Freq: Every day | ORAL | Status: DC
  Administered 2014-06-05 – 2014-06-09 (×3): 100 mg via ORAL
  Filled 2014-06-01 (×8): qty 1

## 2014-06-01 MED ORDER — SODIUM CHLORIDE 0.9 % IV SOLN
INTRAVENOUS | Status: DC
Start: 2014-06-01 — End: 2014-06-02
  Administered 2014-06-01: 14:00:00 via INTRAVENOUS
  Administered 2014-06-02: 1000 mL via INTRAVENOUS

## 2014-06-01 MED ORDER — SODIUM CHLORIDE 0.9 % IV SOLN
750.0000 mL | INTRAVENOUS | Status: DC | PRN
  Administered 2014-06-01: 750 mL via INTRAVENOUS

## 2014-06-01 MED ORDER — ONDANSETRON HCL 4 MG/2ML IJ SOLN
4.0000 mg | Freq: Four times a day (QID) | INTRAMUSCULAR | Status: DC | PRN
  Administered 2014-06-01 – 2014-06-09 (×9): 4 mg via INTRAVENOUS
  Filled 2014-06-01 (×10): qty 2

## 2014-06-01 MED ORDER — ASPIRIN 325 MG PO TABS
325.0000 mg | ORAL_TABLET | Freq: Once | ORAL | Status: DC
  Filled 2014-06-01: qty 1

## 2014-06-01 MED ORDER — MIDAZOLAM HCL 2 MG/2ML IJ SOLN
INTRAMUSCULAR | Status: AC
  Filled 2014-06-01: qty 2

## 2014-06-01 MED ORDER — SODIUM CHLORIDE 0.9 % IJ SOLN
3.0000 mL | Freq: Two times a day (BID) | INTRAMUSCULAR | Status: DC
  Administered 2014-06-02 – 2014-06-10 (×2): 3 mL via INTRAVENOUS

## 2014-06-01 MED ORDER — ENOXAPARIN SODIUM 40 MG/0.4ML ~~LOC~~ SOLN
40.0000 mg | SUBCUTANEOUS | Status: DC
  Administered 2014-06-01: 40 mg via SUBCUTANEOUS
  Filled 2014-06-01: qty 0.4

## 2014-06-01 MED ORDER — ONDANSETRON HCL 4 MG PO TABS: 4.0000 mg | ORAL_TABLET | Freq: Four times a day (QID) | ORAL | Status: DC | PRN

## 2014-06-01 MED ORDER — POLYETHYLENE GLYCOL 3350 17 G PO PACK
17.0000 g | PACK | Freq: Every day | ORAL | Status: DC
  Administered 2014-06-02 – 2014-06-04 (×3): 17 g via ORAL
  Filled 2014-06-01 (×4): qty 1

## 2014-06-01 MED ORDER — CIPROFLOXACIN IN D5W 400 MG/200ML IV SOLN
400.0000 mg | Freq: Two times a day (BID) | INTRAVENOUS | Status: DC
Start: 2014-06-01 — End: 2014-06-04
  Administered 2014-06-01 – 2014-06-04 (×7): 400 mg via INTRAVENOUS
  Filled 2014-06-01 (×8): qty 200

## 2014-06-01 MED ORDER — HYDROCODONE-ACETAMINOPHEN 5-325 MG PO TABS: 1.0000 | ORAL_TABLET | ORAL | Status: DC | PRN

## 2014-06-01 MED ORDER — ACETAMINOPHEN 325 MG PO TABS
650.0000 mg | ORAL_TABLET | Freq: Four times a day (QID) | ORAL | Status: DC | PRN
  Administered 2014-06-10: 650 mg via ORAL
  Filled 2014-06-01: qty 2

## 2014-06-01 MED ORDER — POTASSIUM CHLORIDE 10 MEQ/100ML IV SOLN
INTRAVENOUS | Status: AC
  Administered 2014-06-01: 10 meq
  Filled 2014-06-01: qty 100

## 2014-06-01 MED ORDER — DOCUSATE SODIUM 100 MG PO CAPS
100.0000 mg | ORAL_CAPSULE | Freq: Two times a day (BID) | ORAL | Status: DC
  Administered 2014-06-01: 100 mg via ORAL
  Filled 2014-06-01 (×3): qty 1

## 2014-06-01 MED ORDER — SODIUM CHLORIDE 0.9 % IV SOLN
INTRAVENOUS | Status: DC | PRN
  Administered 2014-06-07: 22:00:00 via INTRA_ARTERIAL

## 2014-06-01 MED ORDER — OXYCODONE HCL 5 MG PO TABS: 25.0000 mg | ORAL_TABLET | ORAL | Status: DC | PRN

## 2014-06-01 MED ORDER — DILTIAZEM HCL 100 MG IV SOLR
5.0000 mg/h | INTRAVENOUS | Status: DC
  Filled 2014-06-01: qty 100

## 2014-06-01 MED ORDER — SODIUM CHLORIDE 0.9 % IJ SOLN
10.0000 mL | INTRAMUSCULAR | Status: DC | PRN
  Administered 2014-06-02 – 2014-06-08 (×8): 10 mL
  Filled 2014-06-01 (×8): qty 40

## 2014-06-01 NOTE — Progress Notes (Signed)
  Echocardiogram 2D Echocardiogram has been performed.  [name] 06/01/2014, 1:03 PM
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
## 2014-06-01 NOTE — Progress Notes (Signed)
F/U resuscitation efforts:  svo2 trending up. Lactic acid normal. MS normal. BP normalized, CVP w/in goal.  Plan Cont current goal  CVP 8-12 MAP >65 (not needing pressors and LA nml) Cont abx and await culture data   Simonne MartinetPeter E Christianne Zacher ACNP-BC [facility] Pager # [phone] OR # [phone] if no answer

## 2014-06-01 NOTE — Progress Notes (Signed)
RT attempted Aline X's 1, Pt refusing to let anyone else try again.  NP karen aware, RT to monitor and assess as needed.
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
## 2014-06-01 NOTE — Progress Notes (Signed)
RN calling elink  D - dimer > 30 and Trop +ver  LAB REVIEW  PULMONARY  Recent Labs Lab 06/01/14 1057 06/01/14 1245  O2SAT 63.1 64.3    CBC  Recent Labs Lab 05/31/14 2101 06/01/14 0343  HGB 10.1* 9.9*  HCT 33.4* 32.6*  WBC 11.6* 11.9*  PLT 411* 388    COAGULATION No results for input(s): INR in the last 168 hours.  CARDIAC   Recent Labs Lab 06/01/14 0343 06/01/14 1630  TROPONINI <0.30 0.36*   No results for input(s): PROBNP in the last 168 hours.   CHEMISTRY  Recent Labs Lab 05/31/14 2101 06/01/14 0343  NA 135* 140  K 3.7 3.5*  CL 98 104  CO2 21 23  GLUCOSE 104* 103*  BUN 18 20  CREATININE 0.76 0.77  CALCIUM 9.3 9.1  MG  --  2.0  PHOS  --  2.1*   Estimated Creatinine Clearance: 96.4 mL/min (by C-G formula based on Cr of 0.77).   LIVER  Recent Labs Lab 05/31/14 2101 06/01/14 0343  AST 12 9  ALT 8 7  ALKPHOS 172* 153*  BILITOT 3.3* 4.0*  PROT 7.5 6.9  ALBUMIN 2.5* 2.4*     INFECTIOUS  Recent Labs Lab 06/01/14 0056 06/01/14 0058 06/01/14 0345 06/01/14 1000  LATICACIDVEN 0.6  --  1.0 0.9  PROCALCITON  --  0.21  --   --      ENDOCRINE CBG (last 3)  No results for input(s): GLUCAP in the last 72 hours.       IMAGING x48h Dg Chest 2 View  06/01/2014   CLINICAL DATA:  Urinary tract infection for 3 days. Fever and hyperventilated being this morning.  EXAM: CHEST  2 VIEW  COMPARISON:  09/05/2013  FINDINGS: Shallow inspiration with elevation of the right hemidiaphragm. Heart size and pulmonary vascularity appear normal for technique. Respiratory motion artifact. No focal airspace disease or consolidation. No blunting of costophrenic angles. No pneumothorax. Postoperative changes in the right humerus.  IMPRESSION: Shallow inspiration with elevation of right hemidiaphragm. No active pulmonary disease.   Electronically Signed   By: Burman NievesWilliam  Stevens M.D.   On: 06/01/2014 01:01   Dg Chest Port 1 View  06/01/2014   CLINICAL DATA:   Central line placement.  EXAM: PORTABLE CHEST - 1 VIEW 10:21 a.m.  COMPARISON:  06/01/2014 at 12:35 a.m.  FINDINGS: The right jugular vein catheter is been inserted and the tip is at the cavoatrial junction in good position. No pneumothorax. Heart size and vascularity are normal. Markings are accentuated due to a very shallow inspiration. Slight elevation of the right hemidiaphragm, chronic.  IMPRESSION: No acute abnormalities.  Central line appears in good position.   Electronically Signed   By: Geanie CooleyJim  Maxwell M.D.   On: 06/01/2014 10:40   Ct Renal Stone Study  05/31/2014   CLINICAL DATA:  Nephrolithiasis, UTI, fever  EXAM: CT ABDOMEN AND PELVIS WITHOUT CONTRAST  TECHNIQUE: Multidetector CT imaging of the abdomen and pelvis was performed following the standard protocol without IV contrast.  COMPARISON:  09/05/2013  FINDINGS: Sagittal images of the spine shows significant degenerative changes lower thoracic and lumbar spine. There are streak artifact from patient large body habitus.  There is pleural-based atelectasis or scarring in right lower lobe posteriorly. Again noted nodular ground-glass attenuation in left lower lobe laterally measures 2.6 by 1.3 cm. Patchy area of ground-glass attenuation in lingula also stable from prior exam.  The patient is status postcholecystectomy. Unenhanced pancreas, spleen and adrenal glands  are unremarkable. Unenhanced kidneys shows mild lobulated contour. There is cortical thinning right greater than left. At least 3 or 4 nonobstructive calcifications are noted within right kidney the largest in midpole measures 7 mm. At least 3 calcified nonobstructive calculi are noted within left kidney the largest measures 1 cm. IVC filter in place.  No hydronephrosis or hydroureter.  No calcified ureteral calculi are noted. Unenhanced uterus and adnexa are unremarkable.  In axial image 55 and 43 there is a right flank ventral hernia containing partial colon without evidence of acute  complication or colonic obstruction.  Moderate gas noted within distal colon. There is a Foley catheter within a decompressed urinary bladder.  There is skin thickening in right posterior perineum lateral to rectum. There is significant stranding of subcutaneous fat and and there is ill-defined collection just posterior to right ischium. This is best seen in axial image 102. Significant cellulitis, decubitus ulcer or subcutaneous abscess cannot be excluded. Clinical correlation is necessary. Measures about 3.5 cm.  IMPRESSION: 1. Stable ground-glass nodule in left lower lobe laterally measures 2.5 x 1.3 cm. Patchy ground-glass attenuation in lingula again noted. 2. Again noted bilateral lobulated renal contour. Bilateral nonobstructive nephrolithiasis. No hydronephrosis or hydroureter. No calcified ureteral calculi are noted. 3. Again noted right flank ventral hernia containing right colon without evidence of acute complication. 4. IVC filter in place. 5. No small bowel obstruction. 6. There is a Foley catheter in decompressed urinary bladder. 7. There is skin thickening in right posterior perineum lateral to rectum. There is significant stranding of subcutaneous fat and and there is ill-defined collection just posterior to right ischium. This is best seen in axial image 102. Significant cellulitis, decubitus ulcer or subcutaneous abscess cannot be excluded. Clinical correlation is necessary. Measures about 3.5 cm.   Electronically Signed   By: Natasha MeadLiviu  Pop M.D.   On: 05/31/2014 23:02       A Rule out PE/DVT Allergic to Iohexol - rash  P VQ scan Stat duplex LE Empiric IV  Heparin   Dr. Kalman ShanMurali Brianny Soulliere, M.D., [facility].C.C.P Pulmonary and Critical Care Medicine Staff Physician  System [place] [facility] Pager: [phone], If no answer or between  15:00h - 7:00h: call [phone]  06/01/2014 9:58 PM
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
## 2014-06-01 NOTE — Progress Notes (Signed)
Utilization review completed.  

## 2014-06-01 NOTE — Progress Notes (Signed)
CRITICAL VALUE ALERT  Critical value received: Troponin 0.36  Date of notification:  06/01/2014  Time of notification: 1715  Critical value read backyes Nurse who received alert: Volanda NapoleonStephanie Francesco Provencal RN MD notified (1st page): Dr Susie CassetteAbrol  Time of first page:  1715  MD notified (2nd page):  Time of second page:  Responding MD:  Time MD responded:

## 2014-06-01 NOTE — Progress Notes (Signed)
Received alert from CCMD that patient's HR in the 180's-200's. EKG obtained and showed A-fib with RVR. Patient in sinus rhythm on admission. Pt complaining of chest pain and back pain 8/10.  Morphine 1 mg IV given. Pt also complaining of SOB. Placed on 2L supplemental oxygen. Pt very anxious. Pt A&O X4. NP paged. NP at bedside. Unable to get accurate BP due obesity. New orders placed. Order to transfer to step down. Husband called to make aware of transfer. No answer, voicemail left.

## 2014-06-01 NOTE — Progress Notes (Signed)
Pt in A. Fib with RVR upon arrival to ICU around 0500. Pt converted back to Sinus Rhythm at 0531. Strip saved. Maren ReamerKaren Kirby, NP at bedside to confirm. Caridzem drip not started per NP. Pt is stable. Will continue to monitor.

## 2014-06-01 NOTE — Progress Notes (Signed)
Patient seen and examined  Discussed with cardiology and critical care  Patient not medically stable to be on [facility] service today  Transferring patient to PCCM  No charge for this visit

## 2014-06-01 NOTE — Procedures (Signed)
Central Venous Catheter Insertion Procedure Note [name] [name] April 07, 1961  Procedure: Insertion of Central Venous Catheter Indications: Assessment of intravascular volume, Drug and/or fluid administration and Frequent blood sampling  Procedure Details Consent: Risks of procedure as well as the alternatives and risks of each were explained to the (patient/caregiver).  Consent for procedure obtained. Time Out: Verified patient identification, verified procedure, site/side was marked, verified correct patient position, special equipment/implants available, medications/allergies/relevent history reviewed, required imaging and test results available.  Performed Real time US was used to ID and cannulate the vessel  Maximum sterile technique was used including antiseptics, cap, gloves, gown, hand hygiene, mask and sheet. Skin prep: Chlorhexidine; local anesthetic administered A antimicrobial bonded/coated triple lumen catheter was placed in the right internal jugular vein using the Seldinger technique.  Evaluation Blood flow good Complications: No apparent complications Patient did tolerate procedure well. Chest X-ray ordered to verify placement.  CXR: normal.  [name] 06/01/2014, 1:30 PM  Ultrasound used for site verification, live visualisation of needle entry & guidewire prior to dilation  Candace MilchALVA,RAKESH V. MD

## 2014-06-02 DIAGNOSIS — I2699 Other pulmonary embolism without acute cor pulmonale: Secondary | ICD-10-CM

## 2014-06-02 DIAGNOSIS — I82401 Acute embolism and thrombosis of unspecified deep veins of right lower extremity: Secondary | ICD-10-CM

## 2014-06-02 LAB — CBC
HEMATOCRIT: 29.4 % — AB (ref 36.0–46.0)
HEMOGLOBIN: 9.1 g/dL — AB (ref 12.0–15.0)
MCH: 26 pg (ref 26.0–34.0)
MCHC: 31 g/dL (ref 30.0–36.0)
MCV: 84 fL (ref 78.0–100.0)
Platelets: 416 10*3/uL — ABNORMAL HIGH (ref 150–400)
RBC: 3.5 MIL/uL — ABNORMAL LOW (ref 3.87–5.11)
RDW: 16.6 % — AB (ref 11.5–15.5)
WBC: 10.3 10*3/uL (ref 4.0–10.5)

## 2014-06-02 LAB — MRSA PCR SCREENING: MRSA by PCR: NEGATIVE

## 2014-06-02 LAB — PROTIME-INR
INR: 1.3 (ref 0.00–1.49)
PROTHROMBIN TIME: 16.4 s — AB (ref 11.6–15.2)

## 2014-06-02 LAB — HEPARIN LEVEL (UNFRACTIONATED): HEPARIN UNFRACTIONATED: 0.11 [IU]/mL — AB (ref 0.30–0.70)

## 2014-06-02 MED ORDER — TECHNETIUM TO 99M ALBUMIN AGGREGATED
4.9000 | Freq: Once | INTRAVENOUS | Status: AC | PRN
  Administered 2014-06-01: 5 via INTRAVENOUS

## 2014-06-02 MED ORDER — TECHNETIUM TC 99M DIETHYLENETRIAME-PENTAACETIC ACID
41.0000 | Freq: Once | INTRAVENOUS | Status: AC | PRN
  Administered 2014-06-01: 41 via INTRAVENOUS

## 2014-06-02 MED ORDER — LACTULOSE 10 GM/15ML PO SOLN
20.0000 g | Freq: Every day | ORAL | Status: DC | PRN
  Filled 2014-06-02: qty 30

## 2014-06-02 MED ORDER — MUSCLE RUB 10-15 % EX CREA
TOPICAL_CREAM | CUTANEOUS | Status: DC | PRN
  Administered 2014-06-02: 22:00:00 via TOPICAL
  Administered 2014-06-10: 1 via TOPICAL
  Filled 2014-06-02: qty 85

## 2014-06-02 MED ORDER — SODIUM CHLORIDE 0.9 % IV SOLN
INTRAVENOUS | Status: DC
  Administered 2014-06-02 – 2014-06-08 (×8): via INTRAVENOUS

## 2014-06-02 MED ORDER — RIVAROXABAN 15 MG PO TABS
15.0000 mg | ORAL_TABLET | Freq: Two times a day (BID) | ORAL | Status: DC
  Administered 2014-06-02 – 2014-06-10 (×17): 15 mg via ORAL
  Filled 2014-06-02 (×21): qty 1

## 2014-06-02 MED ORDER — RIVAROXABAN 20 MG PO TABS: 20.0000 mg | ORAL_TABLET | Freq: Every day | ORAL | Status: DC
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
## 2014-06-02 NOTE — Progress Notes (Signed)
eLink Physician-Brief Progress Note Patient Name: [name] DOB: 05-13-1961 MRN: [id number]   Date of Service  06/02/2014  HPI/Events of Note  Int prob vq  eICU Interventions  Keep hep Doppler legs     Intervention Category Intermediate Interventions: Diagnostic test evaluation  Nelda BucksFEINSTEIN,DANIEL J. 06/02/2014, 1:42 AM

## 2014-06-02 NOTE — Progress Notes (Signed)
Pharmacy Consult Note - IV Heparin lab draws  Note that patient refusing peripheral lab draw for heparin level. Had discussion with patient reeducating accuracy of heparin level when drawn peripherally as compared thru central line that heparin is also administering through. Patient still refusing peripheral stick. Will order lab draw thru central line and have heparin stopped, line flushed, wait 5-10 min, then draw level.  Hessie KnowsJustin M Wayne Wicklund, PharmD, BCPS Pager [phone] 06/02/2014 9:14 AM
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
## 2014-06-02 NOTE — Progress Notes (Signed)
Bilateral lower extremity venous duplex completed.  Right proximal thigh:  Partial DVT noted in the proximal femoral vein.  No evidence of superficial thrombosis.  No Baker's cyst.  Left proximal thigh:  No evidence of DVT, superficial thrombosis, or Baker's cyst.  Technically difficult study due to the patient's body habitus and bilateral below the knee amputation.

## 2014-06-02 NOTE — Plan of Care (Signed)
Problem: Phase I Progression Outcomes Goal: OOB as tolerated unless otherwise ordered Outcome: Not Applicable Date Met:  16/24/46 Pt bil amputee Goal: Voiding-avoid urinary catheter unless indicated Outcome: Not Applicable Date Met:  95/07/22 Patient has chronic foley catheter

## 2014-06-03 LAB — COMPREHENSIVE METABOLIC PANEL
ALT: 6 U/L (ref 0–35)
ANION GAP: 12 (ref 5–15)
AST: 9 U/L (ref 0–37)
Albumin: 1.9 g/dL — ABNORMAL LOW (ref 3.5–5.2)
Alkaline Phosphatase: 108 U/L (ref 39–117)
BUN: 15 mg/dL (ref 6–23)
CALCIUM: 8.3 mg/dL — AB (ref 8.4–10.5)
CO2: 19 mEq/L (ref 19–32)
Chloride: 110 mEq/L (ref 96–112)
Creatinine, Ser: 0.83 mg/dL (ref 0.50–1.10)
GFR calc non Af Amer: 80 mL/min — ABNORMAL LOW (ref >90)
GLUCOSE: 77 mg/dL (ref 70–99)
Potassium: 3.9 mEq/L (ref 3.7–5.3)
Sodium: 141 mEq/L (ref 137–147)
Total Bilirubin: 0.9 mg/dL (ref 0.3–1.2)
Total Protein: 5.8 g/dL — ABNORMAL LOW (ref 6.0–8.3)

## 2014-06-03 LAB — CBC
HCT: 26.8 % — ABNORMAL LOW (ref 36.0–46.0)
HEMOGLOBIN: 8.1 g/dL — AB (ref 12.0–15.0)
MCH: 26.1 pg (ref 26.0–34.0)
MCHC: 30.2 g/dL (ref 30.0–36.0)
MCV: 86.5 fL (ref 78.0–100.0)
Platelets: 365 10*3/uL (ref 150–400)
RBC: 3.1 MIL/uL — ABNORMAL LOW (ref 3.87–5.11)
RDW: 17 % — ABNORMAL HIGH (ref 11.5–15.5)
WBC: 7.3 10*3/uL (ref 4.0–10.5)

## 2014-06-03 LAB — PROCALCITONIN: Procalcitonin: 0.26 ng/mL

## 2014-06-03 MED ORDER — OXYCODONE HCL ER 10 MG PO T12A: 10.0000 mg | EXTENDED_RELEASE_TABLET | Freq: Two times a day (BID) | ORAL | Status: DC

## 2014-06-03 MED ORDER — HYDROCODONE-ACETAMINOPHEN 7.5-325 MG/15ML PO SOLN
15.0000 mL | ORAL | Status: DC | PRN
Start: 2014-06-03 — End: 2014-06-09
  Administered 2014-06-03 – 2014-06-07 (×4): 15 mL via ORAL
  Filled 2014-06-03 (×5): qty 15

## 2014-06-03 MED ORDER — FENTANYL 50 MCG/HR TD PT72
50.0000 ug | MEDICATED_PATCH | TRANSDERMAL | Status: DC
  Administered 2014-06-03 – 2014-06-06 (×2): 50 ug via TRANSDERMAL
  Filled 2014-06-03 (×2): qty 1
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
## 2014-06-04 LAB — CBC
HCT: 26.3 % — ABNORMAL LOW (ref 36.0–46.0)
Hemoglobin: 8.1 g/dL — ABNORMAL LOW (ref 12.0–15.0)
MCH: 26.8 pg (ref 26.0–34.0)
MCHC: 30.8 g/dL (ref 30.0–36.0)
MCV: 87.1 fL (ref 78.0–100.0)
Platelets: 346 10*3/uL (ref 150–400)
RBC: 3.02 MIL/uL — AB (ref 3.87–5.11)
RDW: 17.2 % — ABNORMAL HIGH (ref 11.5–15.5)
WBC: 7.1 10*3/uL (ref 4.0–10.5)

## 2014-06-04 MED ORDER — POLYETHYLENE GLYCOL 3350 17 G PO PACK
17.0000 g | PACK | Freq: Two times a day (BID) | ORAL | Status: DC
  Administered 2014-06-05 – 2014-06-09 (×8): 17 g via ORAL
  Filled 2014-06-04 (×12): qty 1

## 2014-06-04 MED ORDER — BISACODYL 10 MG RE SUPP
10.0000 mg | Freq: Every day | RECTAL | Status: DC | PRN
  Administered 2014-06-04: 10 mg via RECTAL
  Filled 2014-06-04: qty 1

## 2014-06-04 NOTE — Discharge Instructions (Signed)
Information on my medicine - XARELTO® (rivaroxaban) ° °This medication education was reviewed with me or my healthcare representative as part of my discharge preparation.  The pharmacist that spoke with me during my hospital stay was:  Malak Duchesneau Robert, RPH ° °WHY WAS XARELTO® PRESCRIBED FOR YOU? °Xarelto® was prescribed to treat blood clots that may have been found in the veins of your legs (deep vein thrombosis) or in your lungs (pulmonary embolism) and to reduce the risk of them occurring again. ° °What do you need to know about Xarelto®? °The starting dose is one 15 mg tablet taken TWICE daily with food for the FIRST 21 DAYS then on (enter date)  ***  the dose is changed to one 20 mg tablet taken ONCE A DAY with your evening meal. ° °DO NOT stop taking Xarelto® without talking to the health care provider who prescribed the medication.  Refill your prescription for 20 mg tablets before you run out. ° °After discharge, you should have regular check-up appointments with your healthcare provider that is prescribing your Xarelto®.  In the future your dose may need to be changed if your kidney function changes by a significant amount. ° °What do you do if you miss a dose? °If you are taking Xarelto® TWICE DAILY and you miss a dose, take it as soon as you remember. You may take two 15 mg tablets (total 30 mg) at the same time then resume your regularly scheduled 15 mg twice daily the next day. ° °If you are taking Xarelto® ONCE DAILY and you miss a dose, take it as soon as you remember on the same day then continue your regularly scheduled once daily regimen the next day. Do not take two doses of Xarelto® at the same time.  ° °Important Safety Information °Xarelto® is a blood thinner medicine that can cause bleeding. You should call your healthcare provider right away if you experience any of the following: °? Bleeding from an injury or your nose that does not stop. °? Unusual colored urine (red or dark brown)  or unusual colored stools (red or black). °? Unusual bruising for unknown reasons. °? A serious fall or if you hit your head (even if there is no bleeding). ° °Some medicines may interact with Xarelto® and might increase your risk of bleeding while on Xarelto®. To help avoid this, consult your healthcare provider or pharmacist prior to using any new prescription or non-prescription medications, including herbals, vitamins, non-steroidal anti-inflammatory drugs (NSAIDs) and supplements. ° °This website has more information on Xarelto®: www.xarelto.com. ° °
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
## 2014-06-04 NOTE — Progress Notes (Signed)
CARE MANAGEMENT NOTE 06/04/2014  Patient:  Cerro,Sumaiyah A   Account Number:  [id number]  Date Initiated:  06/03/2014  Documentation initiated by:  [facility]Kordae Buonocore  Subjective/Objective Assessment:   PE     Action/Plan:   Anticipated DC Date:  06/06/2014   Anticipated DC Plan:  HOME/SELF CARE      DC Planning Services  CM consult  Medication Assistance      Choice offered to / List presented to:             Status of service:  Completed, signed off Medicare Important Message given?   (If response is "NO", the following Medicare IM given date fields will be blank) Date Medicare IM given:   Medicare IM given by:   Date Additional Medicare IM given:   Additional Medicare IM given by:    Discharge Disposition:  HOME/SELF CARE  Per UR Regulation:  Reviewed for med. necessity/level of care/duration of stay  If discussed at Long Length of Stay Meetings, dates discussed:    Comments:  06/04/2014 1200 NCM spoke to pt and gave pt Xarelto 30 day free trial card, and copay card. Will send benefits check for follow up on copay and if prior auth required for Xarelto.   Isidoro DonningAlesia Braydyn Schultes RN CCM Case Mgmt phone [phone]
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
## 2014-06-05 ENCOUNTER — Inpatient Hospital Stay (HOSPITAL_COMMUNITY): Payer: Managed Care, Other (non HMO)

## 2014-06-05 DIAGNOSIS — I959 Hypotension, unspecified: Secondary | ICD-10-CM

## 2014-06-05 DIAGNOSIS — G8220 Paraplegia, unspecified: Secondary | ICD-10-CM

## 2014-06-05 DIAGNOSIS — R6510 Systemic inflammatory response syndrome (SIRS) of non-infectious origin without acute organ dysfunction: Secondary | ICD-10-CM

## 2014-06-05 LAB — URINALYSIS, ROUTINE W REFLEX MICROSCOPIC
Bilirubin Urine: NEGATIVE
Glucose, UA: NEGATIVE mg/dL
Ketones, ur: NEGATIVE mg/dL
Nitrite: NEGATIVE
Protein, ur: 30 mg/dL — AB
Specific Gravity, Urine: 1.012 (ref 1.005–1.030)
Urobilinogen, UA: 4 mg/dL — ABNORMAL HIGH (ref 0.0–1.0)
pH: 6 (ref 5.0–8.0)

## 2014-06-05 LAB — CBC
HCT: 27.5 % — ABNORMAL LOW (ref 36.0–46.0)
Hemoglobin: 8.2 g/dL — ABNORMAL LOW (ref 12.0–15.0)
MCH: 26 pg (ref 26.0–34.0)
MCHC: 29.8 g/dL — ABNORMAL LOW (ref 30.0–36.0)
MCV: 87.3 fL (ref 78.0–100.0)
Platelets: 353 10*3/uL (ref 150–400)
RBC: 3.15 MIL/uL — ABNORMAL LOW (ref 3.87–5.11)
RDW: 17.4 % — ABNORMAL HIGH (ref 11.5–15.5)
WBC: 6.9 10*3/uL (ref 4.0–10.5)

## 2014-06-05 LAB — TROPONIN I: Troponin I: <0.3 ng/mL

## 2014-06-05 LAB — URINE MICROSCOPIC-ADD ON

## 2014-06-05 LAB — PROCALCITONIN: PROCALCITONIN: 13.4 ng/mL

## 2014-06-05 MED ORDER — SODIUM CHLORIDE 0.9 % IV SOLN
2000.0000 mg | Freq: Once | INTRAVENOUS | Status: AC
  Administered 2014-06-05: 2000 mg via INTRAVENOUS
  Filled 2014-06-05: qty 2000

## 2014-06-05 MED ORDER — SODIUM CHLORIDE 0.9 % IV BOLUS (SEPSIS)
250.0000 mL | Freq: Once | INTRAVENOUS | Status: AC
  Administered 2014-06-05: 250 mL via INTRAVENOUS

## 2014-06-05 MED ORDER — SODIUM CHLORIDE 0.9 % IV BOLUS (SEPSIS)
500.0000 mL | Freq: Once | INTRAVENOUS | Status: AC
  Administered 2014-06-05: 500 mL via INTRAVENOUS

## 2014-06-05 MED ORDER — VANCOMYCIN HCL IN DEXTROSE 1-5 GM/200ML-% IV SOLN
1000.0000 mg | Freq: Two times a day (BID) | INTRAVENOUS | Status: DC
  Administered 2014-06-06 – 2014-06-07 (×4): 1000 mg via INTRAVENOUS
  Filled 2014-06-05 (×4): qty 200

## 2014-06-05 MED ORDER — DEXTROSE 5 % IV SOLN
2.0000 g | Freq: Two times a day (BID) | INTRAVENOUS | Status: DC
  Administered 2014-06-05 – 2014-06-07 (×4): 2 g via INTRAVENOUS
  Filled 2014-06-05 (×5): qty 2

## 2014-06-05 MED ORDER — LACTULOSE 10 GM/15ML PO SOLN
20.0000 g | Freq: Three times a day (TID) | ORAL | Status: DC
  Administered 2014-06-05 – 2014-06-09 (×4): 20 g via ORAL
  Filled 2014-06-05 (×16): qty 30

## 2014-06-05 NOTE — Progress Notes (Signed)
ANTIBIOTIC CONSULT NOTE - INITIAL  See previous note from Iona Hansenachel Jackson, [place] for full details.  ID has consulted Pharmacy dose cefepime and dc aztreonam for treatment of UTI.  Plan:  Cefepime 2g IV q12h Follow up renal function & cultures  Loralee PacasErin Sherly Brodbeck, PharmD, BCPS Pager: [phone] 06/05/2014 2:59 PM
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
## 2014-06-05 NOTE — Progress Notes (Signed)
Pt's BP 80s/40s with HR stable and pt resting. Asymptomatic. PA on call stated for BP to be rechecked manually and will simply monitor if systolic is greater then 90.
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
## 2014-06-05 NOTE — Evaluation (Signed)
Physical Therapy Evaluation Patient Details Name: Rubie MaidBabbie A Sabey MRN: [id number] DOB: 1961/02/19 Today's Date: 06/05/2014   History of Present Illness  53 yo female admitted with UTI, A fib with RVR. Hx of bil BKA, parplegia, HTn, IVC filter. Pt is from home with husband who works.   Clinical Impression  On eval, pt required Max assist +2 for supine>long sitting. Pt stated she "didn't feel good" enough to attempt transfer on today. Discussed d/c plan-pt declined placement-reports she will be returning home at discharge. Recommend HHPT and 24 hour supervision/assist initially.     Follow Up Recommendations Home health PT;Supervision/Assistance - 24 hour (pt declines placement-"I'm definitely going back home.")    Equipment Recommendations  None recommended by PT    Recommendations for Other Services OT consult     Precautions / Restrictions Precautions Precautions: Fall Restrictions Weight Bearing Restrictions: No      Mobility  Bed Mobility Overal bed mobility: Needs Assistance Bed Mobility: Supine to Sit;Sit to Supine     Supine to sit: Max assist;+2 for physical assistance;HOB elevated Sit to supine: Max assist;+2 for physical assistance;HOB elevated   General bed mobility comments: [facility] get to longs[facility]. Pt used bil siderails to pull on as well. Increased time. Attempted sitting EOB however pt states she is unable/she will fall if legs arent fully supported on bed.  Transfers                 General transfer comment: NT-pt stated she didn't feel up to transferring on today  Ambulation/Gait                Stairs            Wheelchair Mobility    Modified Rankin (Stroke Patients Only)       Balance Overall balance assessment: Needs assistance Sitting-balance support: Bilateral upper extremity supported Sitting balance-Leahy Scale: Poor Sitting balance - Comments: Pt needed both siderails to  hold onto in longsitting. Sat with trunk upright for ~2 minutes.                                     Pertinent Vitals/Pain Pain Assessment: 0-10 Pain Score: 7  Pain Location: neck Pain Intervention(s): Premedicated before session    Home Living Family/patient expects to be discharged to:: Private residence Living Arrangements: Spouse/significant other (husband works) Available Help at Discharge: Family Type of Home: House Home Access: Ramped entrance       Home Equipment: Wheelchair - power;Hospital bed (sliding board)      Prior Function Level of Independence: Needs assistance (varies per pt)   Gait / Transfers Assistance Needed: pt states she is sometimes able to transfer bed<>WC (using SB) unassisted. Other times, husband helps.            Hand Dominance        Extremity/Trunk Assessment   Upper Extremity Assessment: Defer to OT evaluation           Lower Extremity Assessment:  (bil BKA and paraplegic)      Cervical / Trunk Assessment: Kyphotic  Communication   Communication: No difficulties  Cognition Arousal/Alertness: Awake/alert Behavior During Therapy: WFL for tasks assessed/performed Overall Cognitive Status: Within Functional Limits for tasks assessed                      General Comments  Exercises        Assessment/Plan    PT Assessment Patient needs continued PT services  PT Diagnosis Generalized weakness;Other (comment) (paraplegia)   PT Problem List Decreased strength;Decreased activity tolerance;Decreased balance;Decreased mobility;Decreased knowledge of use of DME;Obesity  PT Treatment Interventions DME instruction;Functional mobility training;Therapeutic activities;Therapeutic exercise;Balance training;Patient/family education   PT Goals (Current goals can be found in the Care Plan section) Acute Rehab PT Goals Patient Stated Goal: to feel better PT Goal Formulation: With patient Time For Goal  Achievement: 06/19/14 Potential to Achieve Goals: Fair    Frequency Min 3X/week   Barriers to discharge        Co-evaluation               End of Session   Activity Tolerance: Patient limited by pain;Patient limited by fatigue Patient left: in bed;with call bell/phone within reach           Time: 1017-1048 PT Time Calculation (min) (ACUTE ONLY): 31 min   Charges:   PT Evaluation $Initial PT Evaluation Tier I: 1 Procedure PT Treatments $Therapeutic Activity: 8-22 mins   PT G Codes:          Rebeca AlertJannie Gypsy Kellogg, MPT Pager: [phone]

## 2014-06-06 LAB — CBC
HCT: 29.1 % — ABNORMAL LOW (ref 36.0–46.0)
Hemoglobin: 8.6 g/dL — ABNORMAL LOW (ref 12.0–15.0)
MCH: 26.2 pg (ref 26.0–34.0)
MCHC: 29.6 g/dL — ABNORMAL LOW (ref 30.0–36.0)
MCV: 88.7 fL (ref 78.0–100.0)
PLATELETS: 406 10*3/uL — AB (ref 150–400)
RBC: 3.28 MIL/uL — AB (ref 3.87–5.11)
RDW: 17.8 % — ABNORMAL HIGH (ref 11.5–15.5)
WBC: 8.2 10*3/uL (ref 4.0–10.5)

## 2014-06-06 LAB — URINE CULTURE
Colony Count: NO GROWTH
Culture: NO GROWTH

## 2014-06-06 MED ORDER — SENNOSIDES-DOCUSATE SODIUM 8.6-50 MG PO TABS
1.0000 | ORAL_TABLET | Freq: Two times a day (BID) | ORAL | Status: DC
  Administered 2014-06-06 – 2014-06-07 (×4): 1 via ORAL
  Filled 2014-06-06 (×4): qty 1
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
## 2014-06-07 DIAGNOSIS — Z89611 Acquired absence of right leg above knee: Secondary | ICD-10-CM

## 2014-06-07 DIAGNOSIS — IMO0001 Reserved for inherently not codable concepts without codable children: Secondary | ICD-10-CM | POA: Insufficient documentation

## 2014-06-07 DIAGNOSIS — M542 Cervicalgia: Secondary | ICD-10-CM

## 2014-06-07 DIAGNOSIS — D72829 Elevated white blood cell count, unspecified: Secondary | ICD-10-CM

## 2014-06-07 DIAGNOSIS — Z89612 Acquired absence of left leg above knee: Secondary | ICD-10-CM

## 2014-06-07 DIAGNOSIS — A499 Bacterial infection, unspecified: Secondary | ICD-10-CM

## 2014-06-07 DIAGNOSIS — I4891 Unspecified atrial fibrillation: Secondary | ICD-10-CM

## 2014-06-07 DIAGNOSIS — D696 Thrombocytopenia, unspecified: Secondary | ICD-10-CM

## 2014-06-07 LAB — COMPREHENSIVE METABOLIC PANEL
ALK PHOS: 102 U/L (ref 39–117)
ALT: 7 U/L (ref 0–35)
ANION GAP: 12 (ref 5–15)
AST: 9 U/L (ref 0–37)
Albumin: 1.8 g/dL — ABNORMAL LOW (ref 3.5–5.2)
BUN: 7 mg/dL (ref 6–23)
CO2: 20 mEq/L (ref 19–32)
Calcium: 8.1 mg/dL — ABNORMAL LOW (ref 8.4–10.5)
Chloride: 113 mEq/L — ABNORMAL HIGH (ref 96–112)
Creatinine, Ser: 0.59 mg/dL (ref 0.50–1.10)
Glucose, Bld: 85 mg/dL (ref 70–99)
POTASSIUM: 3.9 meq/L (ref 3.7–5.3)
Sodium: 145 mEq/L (ref 137–147)
Total Bilirubin: 0.7 mg/dL (ref 0.3–1.2)
Total Protein: 6 g/dL (ref 6.0–8.3)

## 2014-06-07 LAB — CBC
HCT: 29.9 % — ABNORMAL LOW (ref 36.0–46.0)
HEMOGLOBIN: 8.8 g/dL — AB (ref 12.0–15.0)
MCH: 25.7 pg — ABNORMAL LOW (ref 26.0–34.0)
MCHC: 29.4 g/dL — ABNORMAL LOW (ref 30.0–36.0)
MCV: 87.4 fL (ref 78.0–100.0)
Platelets: 418 10*3/uL — ABNORMAL HIGH (ref 150–400)
RBC: 3.42 MIL/uL — ABNORMAL LOW (ref 3.87–5.11)
RDW: 17.9 % — ABNORMAL HIGH (ref 11.5–15.5)
WBC: 9.3 10*3/uL (ref 4.0–10.5)

## 2014-06-07 LAB — CULTURE, BLOOD (ROUTINE X 2)
CULTURE: NO GROWTH
Culture: NO GROWTH

## 2014-06-07 LAB — URINE CULTURE

## 2014-06-07 MED ORDER — METHOCARBAMOL 500 MG PO TABS
500.0000 mg | ORAL_TABLET | Freq: Three times a day (TID) | ORAL | Status: AC
  Administered 2014-06-07 – 2014-06-09 (×3): 500 mg via ORAL
  Filled 2014-06-07 (×6): qty 1

## 2014-06-07 MED ORDER — CEFTRIAXONE SODIUM IN DEXTROSE 40 MG/ML IV SOLN
2.0000 g | INTRAVENOUS | Status: DC
  Administered 2014-06-07 – 2014-06-09 (×3): 2 g via INTRAVENOUS
  Filled 2014-06-07 (×5): qty 50

## 2014-06-07 MED ORDER — FENTANYL 75 MCG/HR TD PT72
75.0000 ug | MEDICATED_PATCH | TRANSDERMAL | Status: DC
  Administered 2014-06-07: 75 ug via TRANSDERMAL
  Filled 2014-06-07: qty 1

## 2014-06-07 NOTE — Progress Notes (Signed)
    [facility]    Date of Admission:  05/31/2014   Total days of antibiotics 8        Day 3 cefepime        Day 3 vanco           ID: [name] is a 53 y.o. female with paraplegia of bilateral above-knee amputations and chronic indwelling catheter was self-treating at home for urinary tract infection Active Problems:   HTN (hypertension)   UTI (lower urinary tract infection)   Sepsis   Anemia   Chronic pain   Septic shock    Subjective: Afebrile. But complains of neck pain.  Medications:  . cefTRIAXone (ROCEPHIN)  IV  2 g Intravenous Q24H  . fentaNYL  75 mcg Transdermal Q72H  . lactulose  20 g Oral TID  . pantoprazole  40 mg Oral Q1200  . polyethylene glycol  17 g Oral BID  . Rivaroxaban  15 mg Oral BID WC  . [START ON 06/23/2014] rivaroxaban  20 mg Oral Q supper  . senna-docusate  1 tablet Oral BID  . sodium chloride  3 mL Intravenous Q12H  . thiamine  100 mg Oral Daily    Objective: Vital signs in last 24 hours: Temp:  [98.1 F (36.7 C)-98.5 F (36.9 C)] 98.1 F (36.7 C) (12/16 0437) Pulse Rate:  [85-93] 85 (12/16 0437) Resp:  [16-20] 16 (12/16 0437) BP: (135-146)/(72-81) 135/81 mmHg (12/16 0437) SpO2:  [92 %-94 %] 94 % (12/16 0437) Physical Exam  Constitutional:  oriented to person, place, and time. appears well-developed and well-nourished. No distress.  HENT: no nuchal rigidity Mouth/Throat: Oropharynx is clear and moist. No oropharyngeal exudate.  Cardiovascular: Normal rate, regular rhythm and normal heart sounds. Exam reveals no gallop and no friction rub.  No murmur heard.  Pulmonary/Chest: Effort normal and breath sounds normal. No respiratory distress.  has no wheezes.  Abdominal: Soft. Bowel sounds are normal.  exhibits no distension. There is no tenderness.  Lymphadenopathy: no cervical adenopathy.  Ext: bilateral aka Skin: Skin is warm and dry. No rash noted. No erythema.   Lab Results  Recent Labs  06/06/14 0545  06/07/14 0515  WBC 8.2 9.3  HGB 8.6* 8.8*  HCT 29.1* 29.9*  NA  --  145  K  --  3.9  CL  --  113*  CO2  --  20  BUN  --  7  CREATININE  --  0.59   Liver Panel  Recent Labs  06/07/14 0515  PROT 6.0  ALBUMIN 1.8*  AST 9  ALT 7  ALKPHOS 102  BILITOT 0.7    Microbiology: 12/14 blood cx ngtd 12/14 urine cx ngtd 12/12 urine cx 20,000 non-fermenter- gnr: ceftriaxone S, ceftaz S 12/10 blood cx  Studies/Results: No results found.  Assessment/Plan: Probable sepsis of urinary source = GNR isolated on 2nd collection that is sensitive to ceftriaxone. Will narrow her antibiotics to ceftriaxone 2gm iv daily. Would treat as complicated source. Will d/c cefepime and vanco. Would treat as complicated uti currently on day 8 of 14. Can convert to orals once ready for discharge.  Leukocytosis and thrombocytopenia = unclear if it is related to untreated infection. No new sources found  Neck pain = appears to be MSK. Will do a trial of robaxin  Drue SecondSNIDER, [facility] Cell: [phone] Pager: [phone]  06/07/2014, 3:41 PM
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
## 2014-06-07 NOTE — Progress Notes (Signed)
Patient refusing lactulose.

## 2014-06-07 NOTE — Plan of Care (Signed)
Problem: Phase II Progression Outcomes Goal: Obtain order to discontinue catheter if appropriate Outcome: Not Met (add Reason) Pt has chronic foley catheter.   Problem: Phase III Progression Outcomes Goal: Voiding independently Outcome: Not Met (add Reason) Pt has chronic foley catheter  Goal: Foley discontinued Outcome: Not Met (add Reason) Pt has chronic foley catheter

## 2014-06-08 DIAGNOSIS — B9689 Other specified bacterial agents as the cause of diseases classified elsewhere: Secondary | ICD-10-CM

## 2014-06-08 MED ORDER — SORBITOL 70 % SOLN
960.0000 mL | TOPICAL_OIL | Freq: Once | ORAL | Status: AC | PRN
  Filled 2014-06-08: qty 240

## 2014-06-08 MED ORDER — METHYLNALTREXONE BROMIDE 12 MG/0.6ML ~~LOC~~ SOLN: 8.0000 mg | Freq: Once | SUBCUTANEOUS | Status: DC

## 2014-06-08 MED ORDER — SENNOSIDES-DOCUSATE SODIUM 8.6-50 MG PO TABS
4.0000 | ORAL_TABLET | Freq: Two times a day (BID) | ORAL | Status: DC
  Administered 2014-06-08 – 2014-06-09 (×3): 4 via ORAL
  Filled 2014-06-08 (×3): qty 4

## 2014-06-08 NOTE — Progress Notes (Signed)
[facility]    Date of Admission:  05/31/2014   Total days of antibiotics 9        Day 2 ceftriaxone           ID: [name] is a 53 y.o. female with paraplegia of bilateral above-knee amputations and chronic indwelling catheter was self-treating at home for urinary tract infection Active Problems:   HTN (hypertension)   UTI (lower urinary tract infection)   Sepsis   Anemia   Chronic pain   Septic shock   Systemic inflammatory response syndrome due to Gram-negative infection    Subjective: Afebrile. Feels poorly, poor appetite. Less neck pain  Medications:  . cefTRIAXone (ROCEPHIN)  IV  2 g Intravenous Q24H  . fentaNYL  75 mcg Transdermal Q72H  . lactulose  20 g Oral TID  . methocarbamol  500 mg Oral TID  . pantoprazole  40 mg Oral Q1200  . polyethylene glycol  17 g Oral BID  . Rivaroxaban  15 mg Oral BID WC  . [START ON 06/23/2014] rivaroxaban  20 mg Oral Q supper  . senna-docusate  4 tablet Oral BID  . sodium chloride  3 mL Intravenous Q12H  . thiamine  100 mg Oral Daily    Objective: Vital signs in last 24 hours: Temp:  [97.8 F (36.6 C)-98.2 F (36.8 C)] 98 F (36.7 C) (12/17 1536) Pulse Rate:  [79-88] 88 (12/17 1536) Resp:  [18-20] 18 (12/17 1536) BP: (148-166)/(64-78) 166/72 mmHg (12/17 1536) SpO2:  [93 %-99 %] 95 % (12/17 1536) Physical Exam  Constitutional:  oriented to person, place, and time. appears well-developed and well-nourished. No distress.  HENT: no nuchal rigidity Mouth/Throat: Oropharynx is clear and moist. No oropharyngeal exudate.  Cardiovascular: Normal rate, regular rhythm and normal heart sounds. Exam reveals no gallop and no friction rub.  No murmur heard.  Pulmonary/Chest: Effort normal and breath sounds normal. No respiratory distress.  has no wheezes.  Abdominal: Soft. Bowel sounds are normal.  exhibits no distension. There is no tenderness.  Lymphadenopathy: no cervical adenopathy.  Ext: bilateral  aka Skin: Skin is warm and dry. No rash noted. No erythema.   Lab Results  Recent Labs  06/06/14 0545 06/07/14 0515  WBC 8.2 9.3  HGB 8.6* 8.8*  HCT 29.1* 29.9*  NA  --  145  K  --  3.9  CL  --  113*  CO2  --  20  BUN  --  7  CREATININE  --  0.59   Liver Panel  Recent Labs  06/07/14 0515  PROT 6.0  ALBUMIN 1.8*  AST 9  ALT 7  ALKPHOS 102  BILITOT 0.7    Microbiology: 12/14 blood cx ngtd 12/14 urine cx ngtd 12/12 urine cx 20,000 non-fermenter- gnr: ceftriaxone S, ceftaz S 12/10 blood cx  Studies/Results: No results found.  Assessment/Plan: Probable sepsis of urinary source = GNR isolated on 2nd collection that is sensitive to ceftriaxone. Will narrow her antibiotics to ceftriaxone 2gm iv daily. Would treat as complicated source. Would treat as complicated uti currently on day 9 of 14. Can discharge on bactrim DS 1 BID to complete the course  Leukocytosis and thrombocytopenia = unclear if it is related to untreated infection. No new sources found, recommend to repeat infectious work up if she has fevers.  Neck pain = appears to be MSK. Will do a trial of robaxin. Appears to improve her neck pain  [name], [facility] Cell: [phone] Pager: [phone]  06/08/2014, 5:31 PM

## 2014-06-08 NOTE — Progress Notes (Signed)
06/08/14  Patient refuses to take PO pain medicines.

## 2014-06-08 NOTE — Consult Note (Signed)
WOC wound consult note Reason for Consult: Intertriginous dermatitis and MASD (moisture associated skin damage) due to perspiration, fecal incontinence.Patiebnt has a healed pressure ulcer in the coccyx region with scar tissue evident. Wound type: Moisture associated skin damage Pressure Ulcer POA: No Measurement: Large area of MASD with suspected fungal overgrowth.  Peeling, macerated epidermis Wound bed:As described above. Drainage (amount, consistency, odor) Musty odor from skin folds due to inadequate hygiene Periwound:Intact, dry Dressing procedure/placement/frequency:Patient requires a bariatric therapeutic mattress with low air loss feature for moisture management as well as an antimicrobial textile (InterDry Ag+) for intertriginous moisture management in the pannus and inframammaroy areas. Turning and repositioning guidance is provided for nursing staff.  Patient might benefit from a systemic antifungal (eg., Diflucan) to achieve more prompt resolution of this skin issue.  If you agree, please order. WOC nursing team will not follow, but will remain available to this patient, the nursing and medical team.  Please re-consult if needed. Thanks, Ladona MowLaurie Damere Brandenburg, MSN, RN, GNP, [place], CWON-AP [phone])

## 2014-06-08 NOTE — Progress Notes (Signed)
06/08/14  1912  Patient states she has to use stimulation in order to have bowel movement. No BM today. Will need further orders from MD.
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
## 2014-06-08 NOTE — Progress Notes (Signed)
06/08/14  Per infectious disease Patient no longer needs to be on isolation for VRE since it has been [id number] since the last positive result.

## 2014-06-08 NOTE — Progress Notes (Signed)
Physical Therapy Treatment Patient Details Name: [name] MRN: [id number] DOB: July 19, 1960 Today's Date: 06/08/2014    History of Present Illness 53 yo female admitted with UTI, A fib with RVR. Hx of bil BKA, parplegia, HTn, IVC filter. Pt is from home with husband who works.     PT Comments    Performed side to side rolling to place [facility] pad under pt.  Total asssit + 2 pt 10%.  Pt was unable to attempt long sitting or sitting EOB due to poor participation to rolling.  Used Sky Lift to assist pt from bed to recliner.  Increased time for positioning to comfort. Educated NT pt was a lift back to bed and to double cross B LE straps.    Follow Up Recommendations  Home health PT;Supervision/Assistance - 24 hour (pt refusing SNF)     Equipment Recommendations       Recommendations for Other Services       Precautions / Restrictions Precautions Precautions: Fall Precaution Comments: paraplegia, bil BKA Restrictions Weight Bearing Restrictions: No    Mobility  Bed Mobility Overal bed mobility: Needs Assistance Bed Mobility: Rolling Rolling: +2 for physical assistance;Total assist         General bed mobility comments: MAX encouragement and VC's to use B bed rails, performed side to side rolling + 2 total assist pt 10%.  Pt repeated, "I can't roll". Attempted long sitting using B rails pt was unable to even clear shoulder off pillow.    Transfers                 General transfer comment: used Sky Lift to assist pt from bed to recliner. Unable to attempt sitting EOB.  Unable to attempt lateral scooting/sliding board transfer.   Ambulation/Gait                 Stairs            Wheelchair Mobility    Modified Rankin (Stroke Patients Only)       Balance                                    Cognition                            Exercises      General Comments        Pertinent Vitals/Pain      Home Living                       Prior Function            PT Goals (current goals can now be found in the care plan section) Progress towards PT goals: Progressing toward goals    Frequency  Min 3X/week    PT Plan      Co-evaluation             End of Session   Activity Tolerance: Patient limited by fatigue Patient left: in chair;with call bell/phone within reach     Time: [id number] PT Time Calculation (min) (ACUTE ONLY): 28 min  Charges:  $Therapeutic Activity: 23-37 mins                    G Codes:      Felecia ShellingLori Ewald Beg  PTA [facility]  Acute  Rehab Pager  [phone]  

## 2014-06-09 LAB — CBC WITH DIFFERENTIAL/PLATELET
BASOS PCT: 0 % (ref 0–1)
Basophils Absolute: 0 10*3/uL (ref 0.0–0.1)
Eosinophils Absolute: 0.1 10*3/uL (ref 0.0–0.7)
Eosinophils Relative: 1 % (ref 0–5)
HEMATOCRIT: 32.3 % — AB (ref 36.0–46.0)
HEMOGLOBIN: 9.7 g/dL — AB (ref 12.0–15.0)
LYMPHS ABS: 0.8 10*3/uL (ref 0.7–4.0)
Lymphocytes Relative: 7 % — ABNORMAL LOW (ref 12–46)
MCH: 26.1 pg (ref 26.0–34.0)
MCHC: 30 g/dL (ref 30.0–36.0)
MCV: 87.1 fL (ref 78.0–100.0)
MONO ABS: 0.7 10*3/uL (ref 0.1–1.0)
MONOS PCT: 6 % (ref 3–12)
NEUTROS ABS: 9 10*3/uL — AB (ref 1.7–7.7)
Neutrophils Relative %: 86 % — ABNORMAL HIGH (ref 43–77)
Platelets: 511 10*3/uL — ABNORMAL HIGH (ref 150–400)
RBC: 3.71 MIL/uL — AB (ref 3.87–5.11)
RDW: 18.5 % — ABNORMAL HIGH (ref 11.5–15.5)
WBC: 10.6 10*3/uL — ABNORMAL HIGH (ref 4.0–10.5)

## 2014-06-09 LAB — BLOOD GAS, ARTERIAL
Acid-base deficit: 5.1 mmol/L — ABNORMAL HIGH (ref 0.0–2.0)
Bicarbonate: 20.7 mEq/L (ref 20.0–24.0)
DRAWN BY: 331471
O2 SAT: 94.3 %
PO2 ART: 71.7 mmHg — AB (ref 80.0–100.0)
Patient temperature: 98.6
TCO2: 19.7 mmol/L (ref 0–100)
pCO2 arterial: 44 mmHg (ref 35.0–45.0)
pH, Arterial: 7.293 — ABNORMAL LOW (ref 7.350–7.450)

## 2014-06-09 LAB — COMPREHENSIVE METABOLIC PANEL
ALT: 7 U/L (ref 0–35)
AST: 10 U/L (ref 0–37)
Albumin: 1.8 g/dL — ABNORMAL LOW (ref 3.5–5.2)
Alkaline Phosphatase: 110 U/L (ref 39–117)
Anion gap: 10 (ref 5–15)
BUN: 7 mg/dL (ref 6–23)
CO2: 21 mEq/L (ref 19–32)
Calcium: 8.3 mg/dL — ABNORMAL LOW (ref 8.4–10.5)
Chloride: 112 mEq/L (ref 96–112)
Creatinine, Ser: 0.62 mg/dL (ref 0.50–1.10)
GFR calc Af Amer: >90 mL/min (ref >90)
Glucose, Bld: 95 mg/dL (ref 70–99)
Potassium: 3.4 mEq/L — ABNORMAL LOW (ref 3.7–5.3)
Sodium: 143 mEq/L (ref 137–147)
Total Bilirubin: 0.4 mg/dL (ref 0.3–1.2)
Total Protein: 5.8 g/dL — ABNORMAL LOW (ref 6.0–8.3)

## 2014-06-09 LAB — MAGNESIUM: Magnesium: 1.8 mg/dL (ref 1.5–2.5)

## 2014-06-09 LAB — LACTIC ACID, PLASMA: LACTIC ACID, VENOUS: 0.8 mmol/L (ref 0.5–2.2)

## 2014-06-09 MED ORDER — NALOXONE HCL 0.4 MG/ML IJ SOLN
0.4000 mg | INTRAMUSCULAR | Status: DC | PRN
  Administered 2014-06-09: 0.4 mg via INTRAVENOUS
  Filled 2014-06-09: qty 1

## 2014-06-09 MED ORDER — AMLODIPINE BESYLATE 10 MG PO TABS
10.0000 mg | ORAL_TABLET | Freq: Every day | ORAL | Status: DC
  Administered 2014-06-09 – 2014-06-10 (×2): 10 mg via ORAL
  Filled 2014-06-09 (×2): qty 1

## 2014-06-09 MED ORDER — FENTANYL 50 MCG/HR TD PT72: 50.0000 ug | MEDICATED_PATCH | TRANSDERMAL | Status: DC

## 2014-06-09 MED ORDER — HYDRALAZINE HCL 20 MG/ML IJ SOLN
10.0000 mg | Freq: Four times a day (QID) | INTRAMUSCULAR | Status: DC | PRN
  Administered 2014-06-09 – 2014-06-10 (×3): 10 mg via INTRAVENOUS
  Filled 2014-06-09 (×3): qty 1

## 2014-06-09 MED ORDER — ENSURE PUDDING PO PUDG
1.0000 | ORAL | Status: DC
  Filled 2014-06-09: qty 1

## 2014-06-09 MED ORDER — POTASSIUM CHLORIDE IN NACL 40-0.9 MEQ/L-% IV SOLN
INTRAVENOUS | Status: DC
Start: 2014-06-09 — End: 2014-06-10
  Administered 2014-06-09 – 2014-06-10 (×2): 75 mL/h via INTRAVENOUS
  Filled 2014-06-09 (×3): qty 1000

## 2014-06-09 NOTE — Progress Notes (Signed)
Patient crying, c/o weakness in hands.  Spilled lactulose and cup of miralax while attempting to drink them.  Informed MD, fentanyl patch d/c and stat ABG ordered.
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
## 2014-06-09 NOTE — Progress Notes (Signed)
OT Cancellation Note  Patient Details Name: Rubie MaidBabbie A Macfadden MRN: [id number] DOB: [birth year]   Cancelled Treatment:    Reason Eval/Treat Not Completed: Other (comment).  Pt transferred to ICU, being monitored for hypoxia.  Will likely check back on Monday  Achillies Buehl 06/09/2014, 2:24 PM  Marica OtterMaryellen Ammar Moffatt, OTR/L [phone] 06/09/2014

## 2014-06-09 NOTE — Progress Notes (Addendum)
[facility]    Date of Admission:  05/31/2014   Total days of antibiotics 10        Day 3 ceftriaxone           ID: Rubie MaidBabbie A Lepera is a 53 y.o. female with paraplegia of bilateral above-knee amputations and chronic indwelling catheter was self-treating at home for urinary tract infection Active Problems:   HTN (hypertension)   UTI (lower urinary tract infection)   Sepsis   Anemia   Chronic pain   Septic shock   Systemic inflammatory response syndrome due to Gram-negative infection    Subjective: Found to be hypoxic via abg thought to be due to excess pain medication. Fentanyl patch removed, and transferred to ICU for pulse ox monitoring  Medications:  . amLODipine  10 mg Oral Daily  . cefTRIAXone (ROCEPHIN)  IV  2 g Intravenous Q24H  . lactulose  20 g Oral TID  . methocarbamol  500 mg Oral TID  . pantoprazole  40 mg Oral Q1200  . polyethylene glycol  17 g Oral BID  . Rivaroxaban  15 mg Oral BID WC  . [START ON 06/23/2014] rivaroxaban  20 mg Oral Q supper  . senna-docusate  4 tablet Oral BID  . sodium chloride  3 mL Intravenous Q12H  . thiamine  100 mg Oral Daily    Objective: Vital signs in last 24 hours: Temp:  [97.6 F (36.4 C)-98 F (36.7 C)] 98 F (36.7 C) (12/18 0556) Pulse Rate:  [83-91] 83 (12/18 0556) Resp:  [18-20] 20 (12/18 0556) BP: (161-187)/(72-98) 187/94 mmHg (12/18 0556) SpO2:  [95 %-98 %] 98 % (12/18 0556) Physical Exam  Constitutional:  oriented to person, place, and time. appears well-developed and well-nourished. No distress.  HENT: no nuchal rigidity Mouth/Throat: Oropharynx is clear and moist. No oropharyngeal exudate.  Cardiovascular: Normal rate, regular rhythm and normal heart sounds. Exam reveals no gallop and no friction rub.  No murmur heard.  Pulmonary/Chest: Effort normal and breath sounds normal. No respiratory distress.  has no wheezes.  Abdominal: Soft. Bowel sounds are normal.  exhibits no distension.  There is no tenderness.  Lymphadenopathy: no cervical adenopathy.  Ext: bilateral aka Skin: Skin is warm and dry. No rash noted. No erythema.   Lab Results  Recent Labs  06/07/14 0515 06/09/14 0438  WBC 9.3  --   HGB 8.8*  --   HCT 29.9*  --   NA 145 143  K 3.9 3.4*  CL 113* 112  CO2 20 21  BUN 7 7  CREATININE 0.59 0.62   Liver Panel  Recent Labs  06/07/14 0515 06/09/14 0438  PROT 6.0 5.8*  ALBUMIN 1.8* 1.8*  AST 9 10  ALT 7 7  ALKPHOS 102 110  BILITOT 0.7 0.4    Microbiology: 12/14 blood cx ngtd 12/14 urine cx ngtd 12/12 urine cx 20,000 non-fermenter- gnr: ceftriaxone S, ceftaz S 12/10 blood cx  Studies/Results: No results found.  Assessment/Plan: Probable sepsis of urinary source = GNR isolated on 2nd collection that is sensitive to ceftriaxone. Continue with ceftriaxone 2gm iv daily. Would treat as complicated source. Would treat as complicated uti currently on day 10 of 14. Can discharge on bactrim DS 1 BID to complete the course  Leukocytosis and thrombocytopenia = unclear if it is related to untreated infection. No new sources found, recommend to repeat infectious work up if she has fevers. Will repeat cbc today  Hypoxia = presumably thought to  be due to excess pain medication, being monitored in ICU. Defer to primary team for management  Dr. Luciana Axeomer available for questions over the weekend. If patient is still here on Monday, will follow up.  Drue SecondSNIDER, [facility]NTHIA [facility] Cell: [phone] Pager: [phone]  06/09/2014, 2:20 PM

## 2014-06-09 NOTE — Progress Notes (Signed)
Pt refuses to allow ABG blood draw.  Pt mentating, alert and oriented.  Pt stable with good HR, SpO2, and RR.  RN made aware and RT to continue to monitor as needed.

## 2014-06-09 NOTE — Progress Notes (Signed)
NUTRITION FOLLOW UP  Intervention:   -Recommend Ensure Complete once daily, each supplement to provide 350 kcal, 13 gram protein -Encouraged intake of balanced meals for overall improvement in skin integrity and weakness -RD to continue to monitor  Nutrition Dx:   Inadequate oral intake related to constipation as evidenced by PO intake of 25%; ongoning  Goal:   Pt to meet >/= 90% of their estimated nutrition needs; not met  Monitor:   Total protein/energy intake, labs, weights, skin integrity  Assessment:   12/13: Pt reports low appetite for the past 7 days since she became constipated. Pt has laxative ordered. Per pt, she has no BM for 7 days. Pt reports no weight loss.  Nutrition focused physical exam shows no sign of depletion of muscle mass or body fat.   12/18: -Pt continues to have decreased appetite, 0-50% of meals. Has been refusing to order -Family bringing in food from outside sources, viewed fruits, vegetables and dip during time of RD follow up -Pt denied nausea or abd pain, noted overall disinterest in food -Encouraged intake of balanced foods/proteins to assist with skin integrity and improve weakness -Pt willing to trial Ensure Complete once daily until appetite returns to baseline -RD provided sample of supplements -Continues with constipation, last BM 12/15. Has been refusing PO meds and spilled previous Miralax dose d/t hand weakness per RN notes  Height:  Ht Readings from Last 1 Encounters:  06/01/14 _0  (1.6 m)    Weight Status:   Wt Readings from Last 1 Encounters:  06/02/14 243 lb 6.2 oz (110.4 kg)    Re-estimated needs:  Kcal: 1200-1400 Protein: 55-65g Fluid: 1.4L/day  Skin: healed stg 2 pressure ulcer  Diet Order: Diet Heart   Intake/Output Summary (Last 24 hours) at 06/09/14 1621 Last data filed at 06/09/14 1300  Gross per 24 hour  Intake   1850 ml  Output   2250 ml  Net   -400 ml    Last BM: 12/15   Labs:   Recent  Labs Lab 06/03/14 0455 06/07/14 0515 06/09/14 0438  NA 141 145 143  K 3.9 3.9 3.4*  CL 110 113* 112  CO2 _1 BUN _2 CREATININE 0.83 0.59 0.62  CALCIUM 8.3* 8.1* 8.3*  MG  --   --  1.8  GLUCOSE 77 85 95    CBG (last 3)  No results for input(s): GLUCAP in the last 72 hours.  Scheduled Meds: . amLODipine  10 mg Oral Daily  . cefTRIAXone (ROCEPHIN)  IV  2 g Intravenous Q24H  . lactulose  20 g Oral TID  . pantoprazole  40 mg Oral Q1200  . polyethylene glycol  17 g Oral BID  . Rivaroxaban  15 mg Oral BID WC  . [START ON 06/23/2014] rivaroxaban  20 mg Oral Q supper  . senna-docusate  4 tablet Oral BID  . sodium chloride  3 mL Intravenous Q12H  . thiamine  100 mg Oral Daily    Continuous Infusions: . 0.9 % NaCl with KCl 40 mEq / L 75 mL/hr (06/09/14 5015)    Atlee Abide MS RD LDN Clinical Dietitian [id number]

## 2014-06-09 NOTE — Care Management Note (Signed)
    Page 1 of 1   06/09/2014     5:32:34 PM CARE MANAGEMENT NOTE 06/09/2014  Patient:  Selmer,Zariana A   Account Number:  [id number]  Date Initiated:  06/03/2014  Documentation initiated by:  [facility]ALESIA  Subjective/Objective Assessment:   PE     Action/Plan:   Anticipated DC Date:  06/12/2014   Anticipated DC Plan:  HOME W HOME HEALTH SERVICES      DC Planning Services  CM consult  Medication Assistance      Choice offered to / List presented to:             Status of service:  In process, will continue to follow Medicare Important Message given?  YES (If response is "NO", the following Medicare IM given date fields will be blank) Date Medicare IM given:  06/07/2014 Medicare IM given by:  [facility],Heavenleigh Petruzzi Date Additional Medicare IM given:  06/09/2014 Additional Medicare IM given by:  [facility] Kainan Patty  Discharge Disposition:    Per UR Regulation:  Reviewed for med. necessity/level of care/duration of stay  If discussed at Long Length of Stay Meetings, dates discussed:   06/06/2014    Comments:  06/09/14 Lanier ClamKathy Nyan Dufresne RN BSN [facility] [phone] transferred to sdu-hypoxia, ams.Monitor progress.currently d/c plan home w/[facility] already following.  06/07/14 Lanier ClamKathy Joselito Fieldhouse RN BSN [place] [phone] [facility] chosen for HHPT.TC Kristen rep aware of referral, await final HHPT order.  06/04/2014 1200 NCM spoke to pt and gave pt Xarelto 30 day free trial card, and copay card. Will send benefits check for follow up on copay and if prior auth required for Xarelto.   Isidoro DonningAlesia Shavis RN CCM Case Mgmt phone [phone]

## 2014-06-09 NOTE — Progress Notes (Signed)
PT Cancellation Note  Patient Details Name: Rubie MaidBabbie A Raju MRN: [id number] DOB: 02/20/1961   Cancelled Treatment:    Reason Eval/Treat Not Completed: Medical issues which prohibited therapy. Pt transferred to stepdown. Will check back another day. Thanks.    Rebeca AlertJannie Gerardo Caiazzo, MPT Pager: [phone]

## 2014-06-10 MED ORDER — THIAMINE HCL 100 MG PO TABS: 100.0000 mg | ORAL_TABLET | Freq: Every day | ORAL | Status: AC

## 2014-06-10 MED ORDER — RIVAROXABAN 20 MG PO TABS: 20.0000 mg | ORAL_TABLET | Freq: Every day | ORAL | Status: AC

## 2014-06-10 MED ORDER — OXYCODONE HCL 5 MG PO TABS: 5.0000 mg | ORAL_TABLET | ORAL | Status: AC | PRN

## 2014-06-10 MED ORDER — SULFAMETHOXAZOLE-TRIMETHOPRIM 800-160 MG PO TABS: 1.0000 | ORAL_TABLET | Freq: Two times a day (BID) | ORAL | Status: DC

## 2014-06-10 NOTE — Progress Notes (Signed)
Pt refuses to take any PO meds. She states it makes her nauseated Zofran given but pt cont to refuse care and meds.

## 2014-06-10 NOTE — Progress Notes (Signed)
CARE MANAGEMENT NOTE 06/10/2014  Patient:  [name],[name]   Account Number:  [id number]  Date Initiated:  06/03/2014  Documentation initiated by:  [facility],[name]  Subjective/Objective Assessment:   PE     Action/Plan:   lives at home with husband   Anticipated DC Date:  06/12/2014   Anticipated DC Plan:  HOME W HOME HEALTH SERVICES      DC Planning Services  CM consult  Medication Assistance      [facility]  [facility]   Choice offered to / List presented to:  C-1 Patient        HH arranged  HH-2 PT      [facility]   Status of service:  Completed, signed off Medicare Important Message given?  YES (If response is "NO", the following Medicare IM given date fields will be blank) Date Medicare IM given:  06/07/2014 Medicare IM given by:  [name],KATHY Date Additional Medicare IM given:  06/09/2014 Additional Medicare IM given by:  [name]  [facility]  Per UR Regulation:  Reviewed for med. necessity/level of care/duration of stay  If discussed at Long Length of Stay Meetings, dates discussed:   06/06/2014    Comments:  06/10/2014 1200 [facility] orders are in [facility]. Candace DonningAlesia Latonja Bobeck RN CCM Case Mgmt phone [phone]  06/09/14 Candace ClamKathy Mahabir RN BSN NCM [phone] transferred to sdu-hypoxia, ams.Monitor progress.currently d/c plan home w/[facility] already following.  06/07/14 Candace ClamKathy Mahabir RN BSN [place] [phone] [facility] chosen for HHPT.TC Kristen rep aware of referral, await final HHPT order.  06/04/2014 1200 NCM spoke to pt and gave pt Xarelto 30 day free trial card, and copay card. Will send benefits check for follow up on copay and if prior auth required for Xarelto.   Candace DonningAlesia Amyrah Pinkhasov RN CCM Case Mgmt phone [phone]

## 2014-06-10 NOTE — Progress Notes (Signed)
Discharged home with husband. D/C with foley  - chronic foley.  Neck pain much improved.
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
## 2014-06-11 LAB — CULTURE, BLOOD (ROUTINE X 2)
CULTURE: NO GROWTH
Culture: NO GROWTH

## 2014-06-28 ENCOUNTER — Encounter (HOSPITAL_COMMUNITY): Payer: Self-pay | Admitting: Family Medicine

## 2014-06-28 ENCOUNTER — Inpatient Hospital Stay (HOSPITAL_COMMUNITY)
Admission: EM | Admit: 2014-06-28 | Discharge: 2014-07-04 | DRG: 690 | Disposition: A | Discharge status: Home / Self Care | Payer: BLUE CROSS/BLUE SHIELD | Attending: Internal Medicine | Admitting: Internal Medicine

## 2014-06-28 ENCOUNTER — Emergency Department (HOSPITAL_COMMUNITY): Payer: BLUE CROSS/BLUE SHIELD

## 2014-06-28 DIAGNOSIS — N200 Calculus of kidney: Secondary | ICD-10-CM | POA: Diagnosis present

## 2014-06-28 DIAGNOSIS — N390 Urinary tract infection, site not specified: Secondary | ICD-10-CM | POA: Diagnosis not present

## 2014-06-28 DIAGNOSIS — K30 Functional dyspepsia: Secondary | ICD-10-CM | POA: Diagnosis present

## 2014-06-28 DIAGNOSIS — I10 Essential (primary) hypertension: Secondary | ICD-10-CM

## 2014-06-28 DIAGNOSIS — Z8249 Family history of ischemic heart disease and other diseases of the circulatory system: Secondary | ICD-10-CM | POA: Diagnosis not present

## 2014-06-28 DIAGNOSIS — Z87442 Personal history of urinary calculi: Secondary | ICD-10-CM | POA: Diagnosis not present

## 2014-06-28 DIAGNOSIS — R1011 Right upper quadrant pain: Secondary | ICD-10-CM

## 2014-06-28 DIAGNOSIS — Z89611 Acquired absence of right leg above knee: Secondary | ICD-10-CM

## 2014-06-28 DIAGNOSIS — Z6841 Body Mass Index (BMI) 40.0 and over, adult: Secondary | ICD-10-CM

## 2014-06-28 DIAGNOSIS — Z89612 Acquired absence of left leg above knee: Secondary | ICD-10-CM

## 2014-06-28 DIAGNOSIS — G8220 Paraplegia, unspecified: Secondary | ICD-10-CM | POA: Diagnosis present

## 2014-06-28 DIAGNOSIS — Z86718 Personal history of other venous thrombosis and embolism: Secondary | ICD-10-CM

## 2014-06-28 DIAGNOSIS — R1310 Dysphagia, unspecified: Secondary | ICD-10-CM | POA: Diagnosis present

## 2014-06-28 DIAGNOSIS — R112 Nausea with vomiting, unspecified: Secondary | ICD-10-CM

## 2014-06-28 DIAGNOSIS — L8990 Pressure ulcer of unspecified site, unspecified stage: Secondary | ICD-10-CM | POA: Diagnosis present

## 2014-06-28 DIAGNOSIS — I480 Paroxysmal atrial fibrillation: Secondary | ICD-10-CM | POA: Diagnosis present

## 2014-06-28 DIAGNOSIS — Z87891 Personal history of nicotine dependence: Secondary | ICD-10-CM | POA: Diagnosis not present

## 2014-06-28 DIAGNOSIS — R109 Unspecified abdominal pain: Secondary | ICD-10-CM

## 2014-06-28 DIAGNOSIS — R0600 Dyspnea, unspecified: Secondary | ICD-10-CM

## 2014-06-28 LAB — I-STAT CG4 LACTIC ACID, ED: Lactic Acid, Venous: 0.75 mmol/L (ref 0.5–2.2)

## 2014-06-28 LAB — CBC WITH DIFFERENTIAL/PLATELET
Basophils Absolute: 0 10*3/uL (ref 0.0–0.1)
Basophils Relative: 0 % (ref 0–1)
EOS ABS: 0.1 10*3/uL (ref 0.0–0.7)
EOS PCT: 2 % (ref 0–5)
HCT: 32.9 % — ABNORMAL LOW (ref 36.0–46.0)
Hemoglobin: 10 g/dL — ABNORMAL LOW (ref 12.0–15.0)
Lymphocytes Relative: 10 % — ABNORMAL LOW (ref 12–46)
Lymphs Abs: 0.8 10*3/uL (ref 0.7–4.0)
MCH: 25.6 pg — ABNORMAL LOW (ref 26.0–34.0)
MCHC: 30.4 g/dL (ref 30.0–36.0)
MCV: 84.4 fL (ref 78.0–100.0)
MONOS PCT: 10 % (ref 3–12)
Monocytes Absolute: 0.8 10*3/uL (ref 0.1–1.0)
NEUTROS ABS: 6.5 10*3/uL (ref 1.7–7.7)
Neutrophils Relative %: 78 % — ABNORMAL HIGH (ref 43–77)
Platelets: 428 10*3/uL — ABNORMAL HIGH (ref 150–400)
RBC: 3.9 MIL/uL (ref 3.87–5.11)
RDW: 16.9 % — ABNORMAL HIGH (ref 11.5–15.5)
WBC: 8.2 10*3/uL (ref 4.0–10.5)

## 2014-06-28 LAB — URINALYSIS, ROUTINE W REFLEX MICROSCOPIC
BILIRUBIN URINE: NEGATIVE
GLUCOSE, UA: NEGATIVE mg/dL
KETONES UR: 15 mg/dL — AB
Nitrite: NEGATIVE
Protein, ur: 100 mg/dL — AB
Specific Gravity, Urine: 1.014 (ref 1.005–1.030)
Urobilinogen, UA: 1 mg/dL (ref 0.0–1.0)
pH: 6.5 (ref 5.0–8.0)

## 2014-06-28 LAB — COMPREHENSIVE METABOLIC PANEL
ALT: 33 U/L (ref 0–35)
AST: 32 U/L (ref 0–37)
Albumin: 2.2 g/dL — ABNORMAL LOW (ref 3.5–5.2)
Alkaline Phosphatase: 123 U/L — ABNORMAL HIGH (ref 39–117)
Anion gap: 10 (ref 5–15)
BILIRUBIN TOTAL: 1.1 mg/dL (ref 0.3–1.2)
BUN: 12 mg/dL (ref 6–23)
CO2: 22 mmol/L (ref 19–32)
CREATININE: 0.73 mg/dL (ref 0.50–1.10)
Calcium: 8.7 mg/dL (ref 8.4–10.5)
Chloride: 104 mEq/L (ref 96–112)
GFR calc Af Amer: >90 mL/min (ref >90)
GFR calc non Af Amer: >90 mL/min (ref >90)
Glucose, Bld: 135 mg/dL — ABNORMAL HIGH (ref 70–99)
POTASSIUM: 4.8 mmol/L (ref 3.5–5.1)
SODIUM: 136 mmol/L (ref 135–145)
Total Protein: 7.1 g/dL (ref 6.0–8.3)

## 2014-06-28 LAB — URINE MICROSCOPIC-ADD ON

## 2014-06-28 LAB — PROTIME-INR
INR: 1.47 (ref 0.00–1.49)
PROTHROMBIN TIME: 18 s — AB (ref 11.6–15.2)

## 2014-06-28 LAB — BRAIN NATRIURETIC PEPTIDE: B Natriuretic Peptide: 34 pg/mL (ref 0.0–100.0)

## 2014-06-28 MED ORDER — CEFTRIAXONE SODIUM IN DEXTROSE 20 MG/ML IV SOLN
1.0000 g | INTRAVENOUS | Status: DC
  Administered 2014-06-29 – 2014-06-30 (×2): 1 g via INTRAVENOUS
  Filled 2014-06-28 (×2): qty 50

## 2014-06-28 MED ORDER — ACETAMINOPHEN 650 MG RE SUPP
650.0000 mg | Freq: Four times a day (QID) | RECTAL | Status: DC | PRN
  Administered 2014-06-30 – 2014-07-01 (×2): 650 mg via RECTAL
  Filled 2014-06-28 (×3): qty 1

## 2014-06-28 MED ORDER — SODIUM CHLORIDE 0.9 % IV SOLN
INTRAVENOUS | Status: DC
  Administered 2014-06-28: 20:00:00 via INTRAVENOUS

## 2014-06-28 MED ORDER — RIVAROXABAN 20 MG PO TABS
20.0000 mg | ORAL_TABLET | Freq: Every day | ORAL | Status: DC
  Administered 2014-06-29: 20 mg via ORAL
  Filled 2014-06-28 (×2): qty 1

## 2014-06-28 MED ORDER — SODIUM CHLORIDE 0.9 % IV SOLN
INTRAVENOUS | Status: DC
  Administered 2014-06-28 – 2014-07-02 (×7): via INTRAVENOUS

## 2014-06-28 MED ORDER — ONDANSETRON HCL 4 MG PO TABS: 4.0000 mg | ORAL_TABLET | Freq: Four times a day (QID) | ORAL | Status: DC | PRN

## 2014-06-28 MED ORDER — ONDANSETRON HCL 4 MG/2ML IJ SOLN
4.0000 mg | Freq: Four times a day (QID) | INTRAMUSCULAR | Status: DC | PRN
  Administered 2014-06-29 – 2014-06-30 (×2): 4 mg via INTRAVENOUS
  Filled 2014-06-28 (×3): qty 2

## 2014-06-28 MED ORDER — AMLODIPINE BESYLATE 10 MG PO TABS
10.0000 mg | ORAL_TABLET | Freq: Every day | ORAL | Status: DC
  Administered 2014-06-29: 10 mg via ORAL
  Filled 2014-06-28 (×2): qty 1

## 2014-06-28 MED ORDER — FOLIC ACID 1 MG PO TABS
1.0000 mg | ORAL_TABLET | Freq: Every day | ORAL | Status: DC
  Filled 2014-06-28 (×2): qty 1

## 2014-06-28 MED ORDER — DEXTROSE 5 % IV SOLN
1.0000 g | Freq: Once | INTRAVENOUS | Status: AC
  Administered 2014-06-28: 1 g via INTRAVENOUS
  Filled 2014-06-28: qty 10

## 2014-06-28 MED ORDER — HYDROCODONE-ACETAMINOPHEN 5-325 MG PO TABS
1.0000 | ORAL_TABLET | ORAL | Status: DC | PRN
  Administered 2014-07-02 – 2014-07-03 (×4): 2 via ORAL
  Administered 2014-07-04: 1 via ORAL
  Filled 2014-06-28 (×7): qty 2

## 2014-06-28 MED ORDER — BISACODYL 10 MG RE SUPP
10.0000 mg | Freq: Every day | RECTAL | Status: DC | PRN
  Administered 2014-06-30 – 2014-07-03 (×2): 10 mg via RECTAL
  Filled 2014-06-28 (×2): qty 1

## 2014-06-28 MED ORDER — DOCUSATE SODIUM 100 MG PO CAPS
100.0000 mg | ORAL_CAPSULE | Freq: Two times a day (BID) | ORAL | Status: DC
  Filled 2014-06-28 (×3): qty 1

## 2014-06-28 MED ORDER — ADULT MULTIVITAMIN W/MINERALS CH
1.0000 | ORAL_TABLET | Freq: Every day | ORAL | Status: DC
  Filled 2014-06-28 (×2): qty 1

## 2014-06-28 MED ORDER — VITAMIN B-1 100 MG PO TABS
100.0000 mg | ORAL_TABLET | Freq: Every day | ORAL | Status: DC
  Filled 2014-06-28 (×2): qty 1

## 2014-06-28 MED ORDER — ACETAMINOPHEN 325 MG PO TABS
650.0000 mg | ORAL_TABLET | Freq: Four times a day (QID) | ORAL | Status: DC | PRN
  Administered 2014-06-29: 650 mg via ORAL
  Filled 2014-06-28: qty 2
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
## 2014-06-28 NOTE — ED Notes (Signed)
[facility] with c/o shortness of breath and fever x4-6 days. [facility] a multi-day stay to the ICU for sepsis.  Pt is a bilateral BKA and is paralyzed from the waist down. She declined IV start PTA and states that she typically has to have a PICC line placed.  Pt has hx HTN, and frequent UTI's. Pt is A&Ox4.

## 2014-06-28 NOTE — ED Notes (Signed)
Attempted report 

## 2014-06-28 NOTE — ED Notes (Signed)
Admitting physician at bedside

## 2014-06-29 ENCOUNTER — Inpatient Hospital Stay (HOSPITAL_COMMUNITY): Payer: BLUE CROSS/BLUE SHIELD

## 2014-06-29 LAB — CBC
HCT: 31.1 % — ABNORMAL LOW (ref 36.0–46.0)
Hemoglobin: 9.2 g/dL — ABNORMAL LOW (ref 12.0–15.0)
MCH: 25.1 pg — AB (ref 26.0–34.0)
MCHC: 29.6 g/dL — AB (ref 30.0–36.0)
MCV: 85 fL (ref 78.0–100.0)
PLATELETS: 377 10*3/uL (ref 150–400)
RBC: 3.66 MIL/uL — ABNORMAL LOW (ref 3.87–5.11)
RDW: 16.8 % — ABNORMAL HIGH (ref 11.5–15.5)
WBC: 7 10*3/uL (ref 4.0–10.5)

## 2014-06-29 LAB — COMPREHENSIVE METABOLIC PANEL
ALK PHOS: 107 U/L (ref 39–117)
ALT: 27 U/L (ref 0–35)
AST: 23 U/L (ref 0–37)
Albumin: 2 g/dL — ABNORMAL LOW (ref 3.5–5.2)
Anion gap: 8 (ref 5–15)
BUN: 9 mg/dL (ref 6–23)
CHLORIDE: 105 meq/L (ref 96–112)
CO2: 24 mmol/L (ref 19–32)
Calcium: 8.5 mg/dL (ref 8.4–10.5)
Creatinine, Ser: 0.61 mg/dL (ref 0.50–1.10)
GLUCOSE: 95 mg/dL (ref 70–99)
POTASSIUM: 4.3 mmol/L (ref 3.5–5.1)
SODIUM: 137 mmol/L (ref 135–145)
Total Bilirubin: 0.6 mg/dL (ref 0.3–1.2)
Total Protein: 6.3 g/dL (ref 6.0–8.3)

## 2014-06-29 LAB — TSH: TSH: 0.983 u[IU]/mL (ref 0.350–4.500)

## 2014-06-29 LAB — HEMOGLOBIN A1C
Hgb A1c MFr Bld: 4.7 % (ref <5.7)
MEAN PLASMA GLUCOSE: 88 mg/dL (ref <117)

## 2014-06-29 LAB — GLUCOSE, CAPILLARY: Glucose-Capillary: 91 mg/dL (ref 70–99)

## 2014-06-29 MED ORDER — PANTOPRAZOLE SODIUM 40 MG PO TBEC
40.0000 mg | DELAYED_RELEASE_TABLET | Freq: Every day | ORAL | Status: DC
  Administered 2014-06-29: 40 mg via ORAL
  Filled 2014-06-29: qty 1

## 2014-06-29 MED ORDER — ALUM & MAG HYDROXIDE-SIMETH 200-200-20 MG/5ML PO SUSP: 30.0000 mL | Freq: Four times a day (QID) | ORAL | Status: DC | PRN

## 2014-06-29 MED ORDER — PROMETHAZINE HCL 25 MG/ML IJ SOLN
12.5000 mg | Freq: Once | INTRAMUSCULAR | Status: AC
Start: 2014-06-29 — End: 2014-06-29
  Administered 2014-06-29: 12.5 mg via INTRAVENOUS
  Filled 2014-06-29: qty 1

## 2014-06-29 MED ORDER — KETOROLAC TROMETHAMINE 30 MG/ML IJ SOLN
30.0000 mg | Freq: Once | INTRAMUSCULAR | Status: AC
  Administered 2014-06-29: 30 mg via INTRAVENOUS
  Filled 2014-06-29: qty 1
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
## 2014-06-30 ENCOUNTER — Inpatient Hospital Stay (HOSPITAL_COMMUNITY): Payer: BLUE CROSS/BLUE SHIELD

## 2014-06-30 DIAGNOSIS — R110 Nausea: Secondary | ICD-10-CM

## 2014-06-30 LAB — BASIC METABOLIC PANEL
Anion gap: 8 (ref 5–15)
BUN: 10 mg/dL (ref 6–23)
CALCIUM: 8.3 mg/dL — AB (ref 8.4–10.5)
CO2: 23 mmol/L (ref 19–32)
Chloride: 107 mEq/L (ref 96–112)
Creatinine, Ser: 0.77 mg/dL (ref 0.50–1.10)
GFR calc Af Amer: >90 mL/min (ref >90)
GLUCOSE: 79 mg/dL (ref 70–99)
Potassium: 4.2 mmol/L (ref 3.5–5.1)
Sodium: 138 mmol/L (ref 135–145)

## 2014-06-30 LAB — CBC
HEMATOCRIT: 30.5 % — AB (ref 36.0–46.0)
HEMOGLOBIN: 9.1 g/dL — AB (ref 12.0–15.0)
MCH: 26.1 pg (ref 26.0–34.0)
MCHC: 29.8 g/dL — AB (ref 30.0–36.0)
MCV: 87.6 fL (ref 78.0–100.0)
Platelets: 409 10*3/uL — ABNORMAL HIGH (ref 150–400)
RBC: 3.48 MIL/uL — ABNORMAL LOW (ref 3.87–5.11)
RDW: 17.1 % — ABNORMAL HIGH (ref 11.5–15.5)
WBC: 6 10*3/uL (ref 4.0–10.5)

## 2014-06-30 LAB — URINE CULTURE

## 2014-06-30 LAB — GLUCOSE, CAPILLARY: GLUCOSE-CAPILLARY: 73 mg/dL (ref 70–99)

## 2014-06-30 MED ORDER — ENOXAPARIN SODIUM 100 MG/ML ~~LOC~~ SOLN
1.0000 mg/kg | Freq: Two times a day (BID) | SUBCUTANEOUS | Status: DC
  Administered 2014-06-30 – 2014-07-04 (×8): 100 mg via SUBCUTANEOUS
  Filled 2014-06-30 (×10): qty 1

## 2014-06-30 MED ORDER — ONDANSETRON HCL 4 MG/2ML IJ SOLN
4.0000 mg | Freq: Once | INTRAMUSCULAR | Status: AC
  Administered 2014-06-30: 4 mg via INTRAVENOUS

## 2014-06-30 MED ORDER — ONDANSETRON HCL 4 MG PO TABS
4.0000 mg | ORAL_TABLET | Freq: Four times a day (QID) | ORAL | Status: DC
  Administered 2014-07-03 – 2014-07-04 (×3): 4 mg via ORAL
  Filled 2014-06-30 (×16): qty 1

## 2014-06-30 MED ORDER — PROMETHAZINE HCL 25 MG/ML IJ SOLN: 25.0000 mg | Freq: Four times a day (QID) | INTRAMUSCULAR | Status: DC | PRN

## 2014-06-30 MED ORDER — METOPROLOL TARTRATE 1 MG/ML IV SOLN
5.0000 mg | Freq: Two times a day (BID) | INTRAVENOUS | Status: DC
  Administered 2014-06-30 – 2014-07-02 (×6): 5 mg via INTRAVENOUS
  Filled 2014-06-30 (×10): qty 5

## 2014-06-30 MED ORDER — ONDANSETRON HCL 4 MG/2ML IJ SOLN
4.0000 mg | Freq: Four times a day (QID) | INTRAMUSCULAR | Status: DC
  Administered 2014-06-30 – 2014-07-03 (×11): 4 mg via INTRAVENOUS
  Filled 2014-06-30 (×11): qty 2

## 2014-06-30 MED ORDER — PANTOPRAZOLE SODIUM 40 MG IV SOLR
40.0000 mg | INTRAVENOUS | Status: DC
  Administered 2014-06-30 – 2014-07-02 (×3): 40 mg via INTRAVENOUS
  Filled 2014-06-30 (×5): qty 40
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
## 2014-07-01 DIAGNOSIS — G43A1 Cyclical vomiting, intractable: Secondary | ICD-10-CM

## 2014-07-01 LAB — CBC
HCT: 31.3 % — ABNORMAL LOW (ref 36.0–46.0)
Hemoglobin: 9.2 g/dL — ABNORMAL LOW (ref 12.0–15.0)
MCH: 25.3 pg — ABNORMAL LOW (ref 26.0–34.0)
MCHC: 29.4 g/dL — AB (ref 30.0–36.0)
MCV: 86.2 fL (ref 78.0–100.0)
Platelets: 470 10*3/uL — ABNORMAL HIGH (ref 150–400)
RBC: 3.63 MIL/uL — AB (ref 3.87–5.11)
RDW: 17 % — ABNORMAL HIGH (ref 11.5–15.5)
WBC: 5.5 10*3/uL (ref 4.0–10.5)

## 2014-07-01 MED ORDER — POLYETHYLENE GLYCOL 3350 17 G PO PACK
17.0000 g | PACK | Freq: Two times a day (BID) | ORAL | Status: DC
  Administered 2014-07-03 – 2014-07-04 (×2): 17 g via ORAL
  Filled 2014-07-01 (×7): qty 1

## 2014-07-01 MED ORDER — MORPHINE SULFATE 2 MG/ML IJ SOLN
2.0000 mg | INTRAMUSCULAR | Status: DC | PRN
  Administered 2014-07-01 – 2014-07-03 (×4): 2 mg via INTRAVENOUS
  Filled 2014-07-01 (×5): qty 1

## 2014-07-01 MED ORDER — CIPROFLOXACIN IN D5W 400 MG/200ML IV SOLN
400.0000 mg | Freq: Two times a day (BID) | INTRAVENOUS | Status: DC
  Administered 2014-07-01 – 2014-07-03 (×5): 400 mg via INTRAVENOUS
  Filled 2014-07-01 (×7): qty 200

## 2014-07-01 MED ORDER — MORPHINE SULFATE 2 MG/ML IJ SOLN
2.0000 mg | Freq: Once | INTRAMUSCULAR | Status: AC
  Administered 2014-07-01: 2 mg via INTRAVENOUS
  Filled 2014-07-01: qty 1
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
## 2014-07-02 ENCOUNTER — Inpatient Hospital Stay (HOSPITAL_COMMUNITY): Payer: BLUE CROSS/BLUE SHIELD

## 2014-07-02 DIAGNOSIS — R0781 Pleurodynia: Secondary | ICD-10-CM

## 2014-07-02 LAB — COMPREHENSIVE METABOLIC PANEL
ALBUMIN: 2 g/dL — AB (ref 3.5–5.2)
ALT: 26 U/L (ref 0–35)
AST: 22 U/L (ref 0–37)
Alkaline Phosphatase: 110 U/L (ref 39–117)
Anion gap: 12 (ref 5–15)
BUN: <5 mg/dL — ABNORMAL LOW (ref 6–23)
CALCIUM: 8.5 mg/dL (ref 8.4–10.5)
CHLORIDE: 108 meq/L (ref 96–112)
CO2: 18 mmol/L — AB (ref 19–32)
CREATININE: 0.66 mg/dL (ref 0.50–1.10)
GFR calc Af Amer: >90 mL/min (ref >90)
GFR calc non Af Amer: >90 mL/min (ref >90)
Glucose, Bld: 81 mg/dL (ref 70–99)
Potassium: 4.1 mmol/L (ref 3.5–5.1)
Sodium: 138 mmol/L (ref 135–145)
Total Bilirubin: 0.6 mg/dL (ref 0.3–1.2)
Total Protein: 6.7 g/dL (ref 6.0–8.3)

## 2014-07-02 LAB — CBC
HCT: 27.7 % — ABNORMAL LOW (ref 36.0–46.0)
Hemoglobin: 8.3 g/dL — ABNORMAL LOW (ref 12.0–15.0)
MCH: 25.4 pg — ABNORMAL LOW (ref 26.0–34.0)
MCHC: 30 g/dL (ref 30.0–36.0)
MCV: 84.7 fL (ref 78.0–100.0)
Platelets: 530 10*3/uL — ABNORMAL HIGH (ref 150–400)
RBC: 3.27 MIL/uL — ABNORMAL LOW (ref 3.87–5.11)
RDW: 16.7 % — ABNORMAL HIGH (ref 11.5–15.5)
WBC: 7.3 10*3/uL (ref 4.0–10.5)

## 2014-07-02 LAB — GLUCOSE, CAPILLARY: Glucose-Capillary: 82 mg/dL (ref 70–99)
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
## 2014-07-02 NOTE — Progress Notes (Signed)
Patient states that she feels like the fluid and food stops before reaching her stomach, and then comes back up.  She says she is not vomiting.  Maybe a swallow study would not be a bad idea.

## 2014-07-03 ENCOUNTER — Inpatient Hospital Stay (HOSPITAL_COMMUNITY): Payer: BLUE CROSS/BLUE SHIELD

## 2014-07-03 DIAGNOSIS — L8990 Pressure ulcer of unspecified site, unspecified stage: Secondary | ICD-10-CM

## 2014-07-03 DIAGNOSIS — R1310 Dysphagia, unspecified: Secondary | ICD-10-CM

## 2014-07-03 LAB — CBC
HCT: 31 % — ABNORMAL LOW (ref 36.0–46.0)
Hemoglobin: 9.3 g/dL — ABNORMAL LOW (ref 12.0–15.0)
MCH: 25.3 pg — ABNORMAL LOW (ref 26.0–34.0)
MCHC: 30 g/dL (ref 30.0–36.0)
MCV: 84.5 fL (ref 78.0–100.0)
Platelets: 488 10*3/uL — ABNORMAL HIGH (ref 150–400)
RBC: 3.67 MIL/uL — ABNORMAL LOW (ref 3.87–5.11)
RDW: 16.4 % — ABNORMAL HIGH (ref 11.5–15.5)
WBC: 5.1 10*3/uL (ref 4.0–10.5)

## 2014-07-03 LAB — BASIC METABOLIC PANEL
ANION GAP: 9 (ref 5–15)
CHLORIDE: 107 meq/L (ref 96–112)
CO2: 20 mmol/L (ref 19–32)
Calcium: 8.2 mg/dL — ABNORMAL LOW (ref 8.4–10.5)
Creatinine, Ser: 0.7 mg/dL (ref 0.50–1.10)
GFR calc Af Amer: >90 mL/min (ref >90)
GFR calc non Af Amer: >90 mL/min (ref >90)
GLUCOSE: 87 mg/dL (ref 70–99)
Potassium: 3.4 mmol/L — ABNORMAL LOW (ref 3.5–5.1)
Sodium: 136 mmol/L (ref 135–145)

## 2014-07-03 LAB — GLUCOSE, CAPILLARY: GLUCOSE-CAPILLARY: 73 mg/dL (ref 70–99)

## 2014-07-03 MED ORDER — CIPROFLOXACIN HCL 500 MG PO TABS
500.0000 mg | ORAL_TABLET | Freq: Two times a day (BID) | ORAL | Status: DC
  Administered 2014-07-03 – 2014-07-04 (×2): 500 mg via ORAL
  Filled 2014-07-03 (×4): qty 1

## 2014-07-03 MED ORDER — AMLODIPINE BESYLATE 10 MG PO TABS
10.0000 mg | ORAL_TABLET | Freq: Every day | ORAL | Status: DC
  Administered 2014-07-03 – 2014-07-04 (×2): 10 mg via ORAL
  Filled 2014-07-03 (×2): qty 1
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
## 2014-07-03 NOTE — Progress Notes (Signed)
Dr Elvera LennoxGherghe okay for patient not to have IV restarted

## 2014-07-03 NOTE — Progress Notes (Signed)
ANTICOAGULATION CONSULT NOTE - Follow-up Consult  Pharmacy Consult for lovenox Indication: history of VTE (xarelto on hold)  Allergies  Allergen Reactions  . Iodine-131 Hives  . Vancomycin Nausea And Vomiting    [facility]: Tolerated vancomycin run in over 2 hours  . Zosyn [Piperacillin Sod-Tazobactam So] Swelling  . Iohexol Rash    Patient Measurements: Height: 4\' 10"  (147.3 cm) Weight: 225 lb 11.2 oz (102.377 kg) IBW/kg (Calculated) : 40.9   Vital Signs: Temp: 98.6 F (37 C) (01/11 0523) Temp Source: Oral (01/11 0523) BP: 169/87 mmHg (01/11 0523) Pulse Rate: 95 (01/11 0523)  Labs:  Recent Labs  07/01/14 0515 07/02/14 1250 07/03/14 0719  HGB 9.2* 8.3* 9.3*  HCT 31.3* 27.7* 31.0*  PLT 470* 530* 488*  CREATININE  --  0.66 0.70    Estimated Creatinine Clearance: 86 mL/min (by C-G formula based on Cr of 0.7).  Assessment: 54 year old female [facility]i (chronic foley). Noted she is a paraplegic. 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 Xarelto on hold with po swallowing issues - on Lovenox bridge. Hgb low but stable. No bleeding noted.  Goal of Therapy:  Anti-Xa level 0.6-1 units/ml 4hrs after LMWH dose given Monitor platelets by anticoagulation protocol: Yes   Plan:  -Lovenox 100mg  SQ q12h -CBC q72h while on lovenox -Restart Xarelto when able  Christoper Fabianaron Kaydyn Chism, PharmD, BCPS Clinical pharmacist, pager [phone] 07/03/2014 1:26 PM
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
## 2014-07-04 LAB — BASIC METABOLIC PANEL
Anion gap: 7 (ref 5–15)
CO2: 25 mmol/L (ref 19–32)
Calcium: 8 mg/dL — ABNORMAL LOW (ref 8.4–10.5)
Chloride: 103 mEq/L (ref 96–112)
Creatinine, Ser: 0.71 mg/dL (ref 0.50–1.10)
Glucose, Bld: 86 mg/dL (ref 70–99)
Potassium: 3.1 mmol/L — ABNORMAL LOW (ref 3.5–5.1)
SODIUM: 135 mmol/L (ref 135–145)

## 2014-07-04 LAB — CBC
HEMATOCRIT: 33.2 % — AB (ref 36.0–46.0)
Hemoglobin: 9.9 g/dL — ABNORMAL LOW (ref 12.0–15.0)
MCH: 25.7 pg — ABNORMAL LOW (ref 26.0–34.0)
MCHC: 29.8 g/dL — ABNORMAL LOW (ref 30.0–36.0)
MCV: 86.2 fL (ref 78.0–100.0)
PLATELETS: 529 10*3/uL — AB (ref 150–400)
RBC: 3.85 MIL/uL — AB (ref 3.87–5.11)
RDW: 16.4 % — ABNORMAL HIGH (ref 11.5–15.5)
WBC: 5.5 10*3/uL (ref 4.0–10.5)

## 2014-07-04 LAB — GLUCOSE, CAPILLARY: Glucose-Capillary: 80 mg/dL (ref 70–99)

## 2014-07-04 MED ORDER — POTASSIUM CHLORIDE CRYS ER 20 MEQ PO TBCR: 40.0000 meq | EXTENDED_RELEASE_TABLET | Freq: Once | ORAL | Status: DC

## 2014-07-04 MED ORDER — CIPROFLOXACIN HCL 500 MG PO TABS: 500.0000 mg | ORAL_TABLET | Freq: Two times a day (BID) | ORAL | Status: AC
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
## 2014-07-04 NOTE — Care Management Note (Addendum)
07-04-14 Provided patient and husband with information on PCP, they can call number on insurance card to be provided with a list of PCP's in network, [facility] , [facility] . Ronny FlurryHeather Ronny Korff RN BSN [phone]     07-04-14 Patient identified as high risk at LOS meeting this am . Ordered home health RN and SW through [facility] . Ronny FlurryHeather Emmabelle Fear RN BSN [phone]

## 2014-07-24 ENCOUNTER — Inpatient Hospital Stay (HOSPITAL_COMMUNITY)
Admission: EM | Admit: 2014-07-24 | Discharge: 2014-08-22 | DRG: 870 | Disposition: E | Payer: BLUE CROSS/BLUE SHIELD | Attending: Pulmonary Disease | Admitting: Pulmonary Disease

## 2014-07-24 ENCOUNTER — Encounter (HOSPITAL_COMMUNITY): Payer: Self-pay

## 2014-07-24 DIAGNOSIS — Z978 Presence of other specified devices: Secondary | ICD-10-CM | POA: Diagnosis present

## 2014-07-24 DIAGNOSIS — Z89611 Acquired absence of right leg above knee: Secondary | ICD-10-CM

## 2014-07-24 DIAGNOSIS — Z881 Allergy status to other antibiotic agents status: Secondary | ICD-10-CM

## 2014-07-24 DIAGNOSIS — N390 Urinary tract infection, site not specified: Secondary | ICD-10-CM | POA: Diagnosis present

## 2014-07-24 DIAGNOSIS — Z91041 Radiographic dye allergy status: Secondary | ICD-10-CM

## 2014-07-24 DIAGNOSIS — A419 Sepsis, unspecified organism: Secondary | ICD-10-CM | POA: Diagnosis present

## 2014-07-24 DIAGNOSIS — I2699 Other pulmonary embolism without acute cor pulmonale: Secondary | ICD-10-CM

## 2014-07-24 DIAGNOSIS — J9600 Acute respiratory failure, unspecified whether with hypoxia or hypercapnia: Secondary | ICD-10-CM | POA: Diagnosis present

## 2014-07-24 DIAGNOSIS — M546 Pain in thoracic spine: Secondary | ICD-10-CM | POA: Diagnosis present

## 2014-07-24 DIAGNOSIS — G9349 Other encephalopathy: Secondary | ICD-10-CM | POA: Diagnosis not present

## 2014-07-24 DIAGNOSIS — M4644 Discitis, unspecified, thoracic region: Secondary | ICD-10-CM | POA: Diagnosis present

## 2014-07-24 DIAGNOSIS — Z89511 Acquired absence of right leg below knee: Secondary | ICD-10-CM | POA: Diagnosis present

## 2014-07-24 DIAGNOSIS — I5032 Chronic diastolic (congestive) heart failure: Secondary | ICD-10-CM | POA: Diagnosis present

## 2014-07-24 DIAGNOSIS — R531 Weakness: Secondary | ICD-10-CM

## 2014-07-24 DIAGNOSIS — J189 Pneumonia, unspecified organism: Secondary | ICD-10-CM | POA: Insufficient documentation

## 2014-07-24 DIAGNOSIS — G9340 Encephalopathy, unspecified: Secondary | ICD-10-CM | POA: Diagnosis present

## 2014-07-24 DIAGNOSIS — R7881 Bacteremia: Secondary | ICD-10-CM | POA: Diagnosis present

## 2014-07-24 DIAGNOSIS — J9622 Acute and chronic respiratory failure with hypercapnia: Secondary | ICD-10-CM | POA: Diagnosis not present

## 2014-07-24 DIAGNOSIS — K5900 Constipation, unspecified: Secondary | ICD-10-CM | POA: Diagnosis present

## 2014-07-24 DIAGNOSIS — R0600 Dyspnea, unspecified: Secondary | ICD-10-CM

## 2014-07-24 DIAGNOSIS — R630 Anorexia: Secondary | ICD-10-CM

## 2014-07-24 DIAGNOSIS — L89159 Pressure ulcer of sacral region, unspecified stage: Secondary | ICD-10-CM | POA: Diagnosis present

## 2014-07-24 DIAGNOSIS — R0689 Other abnormalities of breathing: Secondary | ICD-10-CM

## 2014-07-24 DIAGNOSIS — R451 Restlessness and agitation: Secondary | ICD-10-CM | POA: Diagnosis not present

## 2014-07-24 DIAGNOSIS — J9690 Respiratory failure, unspecified, unspecified whether with hypoxia or hypercapnia: Secondary | ICD-10-CM

## 2014-07-24 DIAGNOSIS — D649 Anemia, unspecified: Secondary | ICD-10-CM | POA: Diagnosis present

## 2014-07-24 DIAGNOSIS — I959 Hypotension, unspecified: Secondary | ICD-10-CM

## 2014-07-24 DIAGNOSIS — Z9889 Other specified postprocedural states: Secondary | ICD-10-CM

## 2014-07-24 DIAGNOSIS — Z0189 Encounter for other specified special examinations: Secondary | ICD-10-CM

## 2014-07-24 DIAGNOSIS — I10 Essential (primary) hypertension: Secondary | ICD-10-CM | POA: Diagnosis present

## 2014-07-24 DIAGNOSIS — Z960 Presence of urogenital implants: Secondary | ICD-10-CM

## 2014-07-24 DIAGNOSIS — Z89512 Acquired absence of left leg below knee: Secondary | ICD-10-CM

## 2014-07-24 DIAGNOSIS — D638 Anemia in other chronic diseases classified elsewhere: Secondary | ICD-10-CM | POA: Diagnosis present

## 2014-07-24 DIAGNOSIS — R0602 Shortness of breath: Secondary | ICD-10-CM | POA: Insufficient documentation

## 2014-07-24 DIAGNOSIS — E876 Hypokalemia: Secondary | ICD-10-CM | POA: Diagnosis not present

## 2014-07-24 DIAGNOSIS — N179 Acute kidney failure, unspecified: Secondary | ICD-10-CM | POA: Diagnosis not present

## 2014-07-24 DIAGNOSIS — J9601 Acute respiratory failure with hypoxia: Secondary | ICD-10-CM

## 2014-07-24 DIAGNOSIS — Z4659 Encounter for fitting and adjustment of other gastrointestinal appliance and device: Secondary | ICD-10-CM

## 2014-07-24 DIAGNOSIS — E871 Hypo-osmolality and hyponatremia: Secondary | ICD-10-CM | POA: Diagnosis present

## 2014-07-24 DIAGNOSIS — E440 Moderate protein-calorie malnutrition: Secondary | ICD-10-CM | POA: Diagnosis present

## 2014-07-24 DIAGNOSIS — B49 Unspecified mycosis: Secondary | ICD-10-CM | POA: Insufficient documentation

## 2014-07-24 DIAGNOSIS — R6521 Severe sepsis with septic shock: Secondary | ICD-10-CM | POA: Diagnosis present

## 2014-07-24 DIAGNOSIS — M549 Dorsalgia, unspecified: Secondary | ICD-10-CM

## 2014-07-24 DIAGNOSIS — Z8249 Family history of ischemic heart disease and other diseases of the circulatory system: Secondary | ICD-10-CM

## 2014-07-24 DIAGNOSIS — R4182 Altered mental status, unspecified: Secondary | ICD-10-CM

## 2014-07-24 DIAGNOSIS — M4640 Discitis, unspecified, site unspecified: Secondary | ICD-10-CM | POA: Insufficient documentation

## 2014-07-24 DIAGNOSIS — G8220 Paraplegia, unspecified: Secondary | ICD-10-CM | POA: Diagnosis present

## 2014-07-24 DIAGNOSIS — F419 Anxiety disorder, unspecified: Secondary | ICD-10-CM | POA: Diagnosis present

## 2014-07-24 DIAGNOSIS — Z66 Do not resuscitate: Secondary | ICD-10-CM

## 2014-07-24 DIAGNOSIS — Z89612 Acquired absence of left leg above knee: Secondary | ICD-10-CM

## 2014-07-24 DIAGNOSIS — A4101 Sepsis due to Methicillin susceptible Staphylococcus aureus: Secondary | ICD-10-CM | POA: Diagnosis present

## 2014-07-24 DIAGNOSIS — Y95 Nosocomial condition: Secondary | ICD-10-CM | POA: Diagnosis present

## 2014-07-24 DIAGNOSIS — E43 Unspecified severe protein-calorie malnutrition: Secondary | ICD-10-CM | POA: Diagnosis present

## 2014-07-24 DIAGNOSIS — J9621 Acute and chronic respiratory failure with hypoxia: Secondary | ICD-10-CM | POA: Diagnosis present

## 2014-07-24 DIAGNOSIS — Z87891 Personal history of nicotine dependence: Secondary | ICD-10-CM

## 2014-07-24 DIAGNOSIS — Z86718 Personal history of other venous thrombosis and embolism: Secondary | ICD-10-CM

## 2014-07-24 DIAGNOSIS — N200 Calculus of kidney: Secondary | ICD-10-CM

## 2014-07-24 DIAGNOSIS — Z515 Encounter for palliative care: Secondary | ICD-10-CM

## 2014-07-24 DIAGNOSIS — R6520 Severe sepsis without septic shock: Secondary | ICD-10-CM

## 2014-07-24 DIAGNOSIS — B9561 Methicillin susceptible Staphylococcus aureus infection as the cause of diseases classified elsewhere: Secondary | ICD-10-CM | POA: Diagnosis present

## 2014-07-24 DIAGNOSIS — Z6841 Body Mass Index (BMI) 40.0 and over, adult: Secondary | ICD-10-CM

## 2014-07-24 DIAGNOSIS — G4733 Obstructive sleep apnea (adult) (pediatric): Secondary | ICD-10-CM | POA: Diagnosis present

## 2014-07-24 LAB — POC OCCULT BLOOD, ED: Fecal Occult Bld: NEGATIVE

## 2014-07-24 MED ORDER — VANCOMYCIN HCL IN DEXTROSE 1-5 GM/200ML-% IV SOLN
1000.0000 mg | Freq: Once | INTRAVENOUS | Status: AC
  Administered 2014-07-25: 1000 mg via INTRAVENOUS
  Filled 2014-07-24: qty 200

## 2014-07-24 MED ORDER — VANCOMYCIN HCL IN DEXTROSE 1-5 GM/200ML-% IV SOLN: 1000.0000 mg | Freq: Once | INTRAVENOUS | Status: DC

## 2014-07-24 MED ORDER — LEVOFLOXACIN IN D5W 750 MG/150ML IV SOLN
750.0000 mg | Freq: Once | INTRAVENOUS | Status: AC
  Administered 2014-07-25: 750 mg via INTRAVENOUS
  Filled 2014-07-24: qty 150

## 2014-07-24 MED ORDER — LEVOFLOXACIN IN D5W 750 MG/150ML IV SOLN: 750.0000 mg | Freq: Once | INTRAVENOUS | Status: DC

## 2014-07-24 MED ORDER — LIDOCAINE-EPINEPHRINE (PF) 2 %-1:200000 IJ SOLN
10.0000 mL | Freq: Once | INTRAMUSCULAR | Status: DC
  Filled 2014-07-24: qty 20

## 2014-07-24 MED ORDER — LIDOCAINE HCL (PF) 1 % IJ SOLN
INTRAMUSCULAR | Status: AC
  Administered 2014-07-25
  Filled 2014-07-24: qty 5

## 2014-07-24 MED ORDER — DEXTROSE 5 % IV SOLN: 2.0000 g | Freq: Once | INTRAVENOUS | Status: DC

## 2014-07-24 MED ORDER — DEXTROSE 5 % IV SOLN
2.0000 g | Freq: Once | INTRAVENOUS | Status: AC
  Administered 2014-07-25: 2 g via INTRAVENOUS
  Filled 2014-07-24: qty 2

## 2014-07-24 NOTE — ED Notes (Signed)
ED MD made aware of possible sepsis and will come see pt

## 2014-07-24 NOTE — ED Notes (Signed)
Pt brought in by EMS for respiratory distress.  Pt was d/c from hospital on 07/08/14 for UTI. Pt reports SHOB that has been progressively getting worse x2 days.  Pt is not on O2 at home.  Pt went to doctor this week and was also told the UTI was back.
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
## 2014-07-24 NOTE — ED Notes (Signed)
CODE SEPSIS CALLED

## 2014-07-25 ENCOUNTER — Emergency Department (HOSPITAL_COMMUNITY): Payer: BLUE CROSS/BLUE SHIELD

## 2014-07-25 ENCOUNTER — Inpatient Hospital Stay (HOSPITAL_COMMUNITY): Payer: BLUE CROSS/BLUE SHIELD

## 2014-07-25 DIAGNOSIS — Z89612 Acquired absence of left leg above knee: Secondary | ICD-10-CM | POA: Diagnosis not present

## 2014-07-25 DIAGNOSIS — I10 Essential (primary) hypertension: Secondary | ICD-10-CM

## 2014-07-25 DIAGNOSIS — Z91041 Radiographic dye allergy status: Secondary | ICD-10-CM | POA: Diagnosis not present

## 2014-07-25 DIAGNOSIS — Z86718 Personal history of other venous thrombosis and embolism: Secondary | ICD-10-CM | POA: Diagnosis not present

## 2014-07-25 DIAGNOSIS — R7881 Bacteremia: Secondary | ICD-10-CM | POA: Diagnosis not present

## 2014-07-25 DIAGNOSIS — G4733 Obstructive sleep apnea (adult) (pediatric): Secondary | ICD-10-CM | POA: Diagnosis present

## 2014-07-25 DIAGNOSIS — E876 Hypokalemia: Secondary | ICD-10-CM | POA: Diagnosis not present

## 2014-07-25 DIAGNOSIS — I5032 Chronic diastolic (congestive) heart failure: Secondary | ICD-10-CM | POA: Diagnosis present

## 2014-07-25 DIAGNOSIS — Z8249 Family history of ischemic heart disease and other diseases of the circulatory system: Secondary | ICD-10-CM | POA: Diagnosis not present

## 2014-07-25 DIAGNOSIS — A419 Sepsis, unspecified organism: Secondary | ICD-10-CM | POA: Diagnosis present

## 2014-07-25 DIAGNOSIS — N390 Urinary tract infection, site not specified: Secondary | ICD-10-CM | POA: Diagnosis present

## 2014-07-25 DIAGNOSIS — I959 Hypotension, unspecified: Secondary | ICD-10-CM

## 2014-07-25 DIAGNOSIS — J9621 Acute and chronic respiratory failure with hypoxia: Secondary | ICD-10-CM | POA: Diagnosis not present

## 2014-07-25 DIAGNOSIS — J189 Pneumonia, unspecified organism: Secondary | ICD-10-CM | POA: Diagnosis not present

## 2014-07-25 DIAGNOSIS — D638 Anemia in other chronic diseases classified elsewhere: Secondary | ICD-10-CM | POA: Diagnosis present

## 2014-07-25 DIAGNOSIS — Z515 Encounter for palliative care: Secondary | ICD-10-CM | POA: Diagnosis not present

## 2014-07-25 DIAGNOSIS — R509 Fever, unspecified: Secondary | ICD-10-CM | POA: Diagnosis not present

## 2014-07-25 DIAGNOSIS — Z881 Allergy status to other antibiotic agents status: Secondary | ICD-10-CM | POA: Diagnosis not present

## 2014-07-25 DIAGNOSIS — T783XXA Angioneurotic edema, initial encounter: Secondary | ICD-10-CM | POA: Diagnosis not present

## 2014-07-25 DIAGNOSIS — G9340 Encephalopathy, unspecified: Secondary | ICD-10-CM | POA: Diagnosis not present

## 2014-07-25 DIAGNOSIS — F419 Anxiety disorder, unspecified: Secondary | ICD-10-CM | POA: Diagnosis present

## 2014-07-25 DIAGNOSIS — R0602 Shortness of breath: Secondary | ICD-10-CM | POA: Diagnosis present

## 2014-07-25 DIAGNOSIS — R451 Restlessness and agitation: Secondary | ICD-10-CM | POA: Diagnosis not present

## 2014-07-25 DIAGNOSIS — Y95 Nosocomial condition: Secondary | ICD-10-CM | POA: Diagnosis present

## 2014-07-25 DIAGNOSIS — J9622 Acute and chronic respiratory failure with hypercapnia: Secondary | ICD-10-CM | POA: Diagnosis not present

## 2014-07-25 DIAGNOSIS — E440 Moderate protein-calorie malnutrition: Secondary | ICD-10-CM

## 2014-07-25 DIAGNOSIS — G9349 Other encephalopathy: Secondary | ICD-10-CM | POA: Diagnosis not present

## 2014-07-25 DIAGNOSIS — Z89611 Acquired absence of right leg above knee: Secondary | ICD-10-CM | POA: Diagnosis not present

## 2014-07-25 DIAGNOSIS — J9601 Acute respiratory failure with hypoxia: Secondary | ICD-10-CM | POA: Diagnosis not present

## 2014-07-25 DIAGNOSIS — Z89512 Acquired absence of left leg below knee: Secondary | ICD-10-CM | POA: Diagnosis not present

## 2014-07-25 DIAGNOSIS — N179 Acute kidney failure, unspecified: Secondary | ICD-10-CM | POA: Diagnosis not present

## 2014-07-25 DIAGNOSIS — Z66 Do not resuscitate: Secondary | ICD-10-CM | POA: Diagnosis not present

## 2014-07-25 DIAGNOSIS — R6521 Severe sepsis with septic shock: Secondary | ICD-10-CM | POA: Diagnosis present

## 2014-07-25 DIAGNOSIS — E871 Hypo-osmolality and hyponatremia: Secondary | ICD-10-CM | POA: Diagnosis present

## 2014-07-25 DIAGNOSIS — Z87891 Personal history of nicotine dependence: Secondary | ICD-10-CM | POA: Diagnosis not present

## 2014-07-25 DIAGNOSIS — A4101 Sepsis due to Methicillin susceptible Staphylococcus aureus: Secondary | ICD-10-CM | POA: Diagnosis present

## 2014-07-25 DIAGNOSIS — M4644 Discitis, unspecified, thoracic region: Secondary | ICD-10-CM | POA: Diagnosis present

## 2014-07-25 DIAGNOSIS — E43 Unspecified severe protein-calorie malnutrition: Secondary | ICD-10-CM | POA: Diagnosis present

## 2014-07-25 DIAGNOSIS — B9561 Methicillin susceptible Staphylococcus aureus infection as the cause of diseases classified elsewhere: Secondary | ICD-10-CM | POA: Diagnosis not present

## 2014-07-25 DIAGNOSIS — G8220 Paraplegia, unspecified: Secondary | ICD-10-CM | POA: Diagnosis present

## 2014-07-25 DIAGNOSIS — Z6841 Body Mass Index (BMI) 40.0 and over, adult: Secondary | ICD-10-CM | POA: Diagnosis not present

## 2014-07-25 DIAGNOSIS — K5900 Constipation, unspecified: Secondary | ICD-10-CM | POA: Diagnosis present

## 2014-07-25 DIAGNOSIS — Z89511 Acquired absence of right leg below knee: Secondary | ICD-10-CM | POA: Diagnosis not present

## 2014-07-25 LAB — COMPREHENSIVE METABOLIC PANEL
ALT: 24 U/L (ref 0–35)
ANION GAP: 8 (ref 5–15)
AST: 21 U/L (ref 0–37)
Albumin: 1.8 g/dL — ABNORMAL LOW (ref 3.5–5.2)
Alkaline Phosphatase: 94 U/L (ref 39–117)
BUN: 14 mg/dL (ref 6–23)
CHLORIDE: 103 mmol/L (ref 96–112)
CO2: 27 mmol/L (ref 19–32)
Calcium: 8.2 mg/dL — ABNORMAL LOW (ref 8.4–10.5)
Creatinine, Ser: 0.92 mg/dL (ref 0.50–1.10)
GFR calc Af Amer: 82 mL/min — ABNORMAL LOW (ref >90)
GFR, EST NON AFRICAN AMERICAN: 70 mL/min — AB (ref >90)
Glucose, Bld: 131 mg/dL — ABNORMAL HIGH (ref 70–99)
Potassium: 4.7 mmol/L (ref 3.5–5.1)
Sodium: 138 mmol/L (ref 135–145)
Total Bilirubin: 0.8 mg/dL (ref 0.3–1.2)
Total Protein: 6.9 g/dL (ref 6.0–8.3)

## 2014-07-25 LAB — CBC
HCT: 33 % — ABNORMAL LOW (ref 36.0–46.0)
HEMATOCRIT: 34.8 % — AB (ref 36.0–46.0)
HEMOGLOBIN: 10.4 g/dL — AB (ref 12.0–15.0)
HEMOGLOBIN: 9.7 g/dL — AB (ref 12.0–15.0)
MCH: 24.1 pg — AB (ref 26.0–34.0)
MCH: 24.1 pg — AB (ref 26.0–34.0)
MCHC: 29.9 g/dL — ABNORMAL LOW (ref 30.0–36.0)
MCHC: 29.4 g/dL — ABNORMAL LOW (ref 30.0–36.0)
MCV: 80.7 fL (ref 78.0–100.0)
MCV: 81.9 fL (ref 78.0–100.0)
PLATELETS: 308 10*3/uL (ref 150–400)
Platelets: 352 10*3/uL (ref 150–400)
RBC: 4.31 MIL/uL (ref 3.87–5.11)
RBC: 4.03 MIL/uL (ref 3.87–5.11)
RDW: 17.7 % — ABNORMAL HIGH (ref 11.5–15.5)
RDW: 17.9 % — ABNORMAL HIGH (ref 11.5–15.5)
WBC: 10.9 10*3/uL — ABNORMAL HIGH (ref 4.0–10.5)
WBC: 11.3 10*3/uL — AB (ref 4.0–10.5)

## 2014-07-25 LAB — MRSA PCR SCREENING: MRSA BY PCR: NEGATIVE

## 2014-07-25 LAB — CBC WITH DIFFERENTIAL/PLATELET
BASOS ABS: 0.1 10*3/uL (ref 0.0–0.1)
BASOS PCT: 1 % (ref 0–1)
Eosinophils Absolute: 0.1 10*3/uL (ref 0.0–0.7)
Eosinophils Relative: 1 % (ref 0–5)
HCT: 32.3 % — ABNORMAL LOW (ref 36.0–46.0)
HEMOGLOBIN: 10 g/dL — AB (ref 12.0–15.0)
LYMPHS PCT: 15 % (ref 12–46)
Lymphs Abs: 1.6 10*3/uL (ref 0.7–4.0)
MCH: 24.9 pg — ABNORMAL LOW (ref 26.0–34.0)
MCHC: 31 g/dL (ref 30.0–36.0)
MCV: 80.3 fL (ref 78.0–100.0)
Monocytes Absolute: 1.2 10*3/uL — ABNORMAL HIGH (ref 0.1–1.0)
Monocytes Relative: 11 % (ref 3–12)
Neutro Abs: 7.9 10*3/uL — ABNORMAL HIGH (ref 1.7–7.7)
Neutrophils Relative %: 72 % (ref 43–77)
PLATELETS: 234 10*3/uL (ref 150–400)
RBC: 4.02 MIL/uL (ref 3.87–5.11)
RDW: 17.7 % — ABNORMAL HIGH (ref 11.5–15.5)
Smear Review: ADEQUATE
WBC: 10.9 10*3/uL — ABNORMAL HIGH (ref 4.0–10.5)

## 2014-07-25 LAB — I-STAT CG4 LACTIC ACID, ED
Lactic Acid, Venous: 0.78 mmol/L (ref 0.5–2.0)
Lactic Acid, Venous: 0.62 mmol/L (ref 0.5–2.0)

## 2014-07-25 LAB — URINE MICROSCOPIC-ADD ON

## 2014-07-25 LAB — BASIC METABOLIC PANEL
Anion gap: 8 (ref 5–15)
BUN: 13 mg/dL (ref 6–23)
CALCIUM: 7.5 mg/dL — AB (ref 8.4–10.5)
CO2: 23 mmol/L (ref 19–32)
Chloride: 108 mmol/L (ref 96–112)
Creatinine, Ser: 0.74 mg/dL (ref 0.50–1.10)
GFR calc Af Amer: >90 mL/min (ref >90)
GFR calc non Af Amer: >90 mL/min (ref >90)
GLUCOSE: 92 mg/dL (ref 70–99)
Potassium: 4 mmol/L (ref 3.5–5.1)
SODIUM: 139 mmol/L (ref 135–145)

## 2014-07-25 LAB — URINALYSIS, ROUTINE W REFLEX MICROSCOPIC
Bilirubin Urine: NEGATIVE
GLUCOSE, UA: NEGATIVE mg/dL
KETONES UR: 15 mg/dL — AB
Nitrite: NEGATIVE
Protein, ur: 100 mg/dL — AB
Specific Gravity, Urine: 1.014 (ref 1.005–1.030)
UROBILINOGEN UA: 1 mg/dL (ref 0.0–1.0)
pH: 7 (ref 5.0–8.0)

## 2014-07-25 LAB — I-STAT TROPONIN, ED: Troponin i, poc: 0.01 ng/mL (ref 0.00–0.08)

## 2014-07-25 MED ORDER — HYDROCODONE-ACETAMINOPHEN 10-325 MG PO TABS
1.0000 | ORAL_TABLET | Freq: Four times a day (QID) | ORAL | Status: DC | PRN
  Filled 2014-07-25 (×3): qty 1

## 2014-07-25 MED ORDER — ONDANSETRON HCL 4 MG PO TABS: 4.0000 mg | ORAL_TABLET | Freq: Four times a day (QID) | ORAL | Status: DC | PRN

## 2014-07-25 MED ORDER — AZTREONAM 2 G IJ SOLR
2.0000 g | Freq: Three times a day (TID) | INTRAMUSCULAR | Status: DC
Start: 2014-07-25 — End: 2014-07-26
  Administered 2014-07-25 – 2014-07-26 (×4): 2 g via INTRAVENOUS
  Filled 2014-07-25 (×8): qty 2

## 2014-07-25 MED ORDER — TEMAZEPAM 15 MG PO CAPS
30.0000 mg | ORAL_CAPSULE | Freq: Every day | ORAL | Status: DC
  Filled 2014-07-25: qty 2

## 2014-07-25 MED ORDER — MORPHINE SULFATE 2 MG/ML IJ SOLN
1.0000 mg | INTRAMUSCULAR | Status: DC | PRN
Start: 2014-07-25 — End: 2014-07-28
  Administered 2014-07-25: 1 mg via INTRAVENOUS
  Administered 2014-07-26 – 2014-07-28 (×4): 2 mg via INTRAVENOUS
  Filled 2014-07-25 (×6): qty 1

## 2014-07-25 MED ORDER — SODIUM CHLORIDE 0.9 % IV SOLN
INTRAVENOUS | Status: DC
  Administered 2014-07-25 – 2014-07-27 (×4): via INTRAVENOUS

## 2014-07-25 MED ORDER — ONDANSETRON HCL 4 MG/2ML IJ SOLN: 4.0000 mg | Freq: Four times a day (QID) | INTRAMUSCULAR | Status: DC | PRN

## 2014-07-25 MED ORDER — ACETAMINOPHEN 325 MG PO TABS
650.0000 mg | ORAL_TABLET | Freq: Once | ORAL | Status: AC
  Administered 2014-07-25: 650 mg via ORAL
  Filled 2014-07-25: qty 2

## 2014-07-25 MED ORDER — SODIUM CHLORIDE 0.9 % IV SOLN
INTRAVENOUS | Status: DC
  Administered 2014-07-25: 08:00:00 via INTRAVENOUS

## 2014-07-25 MED ORDER — LEVOFLOXACIN IN D5W 750 MG/150ML IV SOLN
750.0000 mg | INTRAVENOUS | Status: DC
  Administered 2014-07-26 – 2014-07-27 (×2): 750 mg via INTRAVENOUS
  Filled 2014-07-25 (×2): qty 150

## 2014-07-25 MED ORDER — RIVAROXABAN 20 MG PO TABS
20.0000 mg | ORAL_TABLET | Freq: Every day | ORAL | Status: DC
  Administered 2014-07-25: 20 mg via ORAL
  Filled 2014-07-25 (×3): qty 1

## 2014-07-25 MED ORDER — SODIUM CHLORIDE 0.9 % IJ SOLN
3.0000 mL | Freq: Two times a day (BID) | INTRAMUSCULAR | Status: DC
  Administered 2014-07-25 – 2014-08-03 (×15): 3 mL via INTRAVENOUS

## 2014-07-25 MED ORDER — SODIUM CHLORIDE 0.9 % IV BOLUS (SEPSIS)
3000.0000 mL | Freq: Once | INTRAVENOUS | Status: AC
Start: 2014-07-25 — End: 2014-07-25
  Administered 2014-07-25: 3000 mL via INTRAVENOUS
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
## 2014-07-25 NOTE — Plan of Care (Signed)
Problem: Consults Goal: Nutrition Consult-if indicated Outcome: Progressing Automatically flagged per malnutrition screening.

## 2014-07-25 NOTE — Clinical Social Work Note (Signed)
CSW Consult Acknowledged:    CSW received a consult for PCP referral. CSW nodified the case manager. CSW will sign off.     [name], MSW, LCSWA [phone]

## 2014-07-25 NOTE — ED Notes (Signed)
TRANSPORTER CALL FOR BED.

## 2014-07-25 NOTE — ED Notes (Signed)
Called lab regarding Respiratory Virus Panel will send probe to tube station [id number].

## 2014-07-25 NOTE — ED Notes (Signed)
Pt. Refusing to have labs drawn by phelbotomy. States she will only have a butterfly needle used on you.

## 2014-07-25 NOTE — Progress Notes (Signed)
Utilization review completed.  

## 2014-07-25 NOTE — ED Notes (Signed)
Pt is refusing any further IV attempts.  Lab has attempted twice with no success, second phlebotomist to come attempt.

## 2014-07-25 NOTE — ED Notes (Signed)
Patient moved to regular hospital bed.  Back and chest washed, linens changed.

## 2014-07-25 NOTE — Progress Notes (Addendum)
[place] TEAM 1 - Stepdown/ICU TEAM Progress Note  [name] BJY:[id number] DOB: Aug 30, 1960 DOA: 08-13-14 PCP: No PCP Per Patient  Admit HPI / Brief Narrative: [name] is a 54 y.o.BF PMHx recurrent UTI and nephrolithiasis (last urine culture Jan 2016 grew Acinetobacter, sensitive to quinolones), S/P Cystoscopy w/ bilateral ureteral stent placement4/2013 (stents removed later that year), S/P cystoscopy with right ureteral stent placement 11/2012, S/P bilateral Nephrolithotomy 10/2011, 11/2011, PE 05/2014.  Presented to the ED complaining of fever and decreased appetite. She denies chills or sweats. She has had nausea, but no vomiting. She has cough productive of thick, clear phlegm, but she denies sick contacts in the home. No body aches. She has had intermittent abdominal pain. Last antibiotic completed was oral ciprofloxacin. Sepsis protocol initiated promptly upon presentation secondary to documented fever to 103, HR 140, tachypnea with O2 requirement, and suspected recurrent UTI. The patient also demonstrated relative hypotension. The patient is being admitted for management of severe sepsis.   HPI/Subjective: 2/2 patient just admitted early this a.m.,  Assessment/Plan: Recurrent UTI causing severe sepsis -responsive to fluids, present on admission. -Contine levaquin and aztreonam until culture data comes back (Hx of MDR Acinetobacter infection)  - Foley catheter exchanged in the ED. -Blood cultures pending -Urine Culture pending -Bilateral ultrasound of kidneys and bladder secondary to multiple episodes of nephrolithiasis requiring multiple stenting, surgeries pending. -Morphine PRN pain  Cough. - No evidence of pneumonia on CXR. - Flu screen/respiratory viral panel ordered.  PE diagnosed 05/2014 -On admission on Xarelto 20 mg daily  HTN.  -Relative hypotension present on admission. Improved after IV fluids.   Moderate to severe protein calorie  malnutrition. -Nutrition consult  Chronic anemia.  -No transfusion requirement. -Anemia workup    Code Status: FULL Family Communication: family present at time of exam Disposition Plan: Resolution sepsis    Consultants: NA  Procedure/Significant Events: 06/01/2014 echocardiogram;- LVEF = 50% to 55%.  -(grade 1diastolic dysfunction). - Left atrium: The atrium was mildly dilated.   Culture 2/2 blood 2 pending 2/2 urine pending 2/2 respiratory virus panel pending 2/2 MRSA by PCR negative   Antibiotics: Aztreonam 2/2>> Levofloxacin 2/2>>    DVT prophylaxis: Xarelto   Devices Chronic indwelling Foley   LINES / TUBES:  NA    Continuous Infusions: . sodium chloride 100 mL/hr at 07/25/14 1622    Objective: VITAL SIGNS: Temp: 98.2 F (36.8 C) (02/02 1500) Temp Source: Oral (02/02 1500) BP: 154/81 mmHg (02/02 1500) Pulse Rate: 87 (02/02 1500) SPO2; FIO2:   Intake/Output Summary (Last 24 hours) at 07/25/14 1718 Last data filed at 07/25/14 1135  Gross per 24 hour  Intake      0 ml  Output    450 ml  Net   -450 ml     Exam: General: A/O 4, positive left  back pain, No acute respiratory distress Lungs: Clear to auscultation bilaterally without wheezes or crackles Cardiovascular: Regular rate and rhythm without murmur gallop or rub normal S1 and S2 Abdomen: Nontender, nondistended, soft, bowel sounds positive, no rebound, no ascites, no appreciable mass, left CVA tenderness outpatient Extremities: No significant cyanosis, clubbing. 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 No results for input(s): AMMONIA in the last 168 hours. CBC:  Recent Labs Lab 07/25/14 0100 07/25/14 0651  WBC 10.9* 11.3*  HGB 10.4* 9.7*  HCT 34.8* 33.0*  MCV 80.7 81.9  PLT 352 308   Cardiac Enzymes: No results for input(s): CKTOTAL, CKMB, CKMBINDEX, TROPONINI in the last 168 hours. BNP (last 3 results)  Recent Labs  06/28/14 1906  BNP 34.0    ProBNP (last 3 results) No results for input(s): PROBNP in the last 8760 hours.  CBG: No results for input(s): GLUCAP in the last 168 hours.  Recent Results (from the past 240 hour(s))  MRSA PCR Screening     Status: None   Collection Time: 07/25/14  2:59 PM  Result Value Ref Range Status   MRSA by PCR NEGATIVE NEGATIVE Final    Comment:        The GeneXpert MRSA Assay (FDA approved for NASAL specimens only), is one component of a comprehensive MRSA colonization surveillance program. It is not intended to diagnose MRSA infection nor to guide or monitor treatment for MRSA infections.      Studies:  Recent x-ray studies have been reviewed in detail by the Attending Physician  Scheduled Meds:  Scheduled Meds: . aztreonam  2 g Intravenous Q8H  . [START ON 07/26/2014] levofloxacin (LEVAQUIN) IV  750 mg Intravenous Q24H  . rivaroxaban  20 mg Oral Q supper  . sodium chloride  3 mL Intravenous Q12H  . temazepam  30 mg Oral QHS    Time spent on care of this patient: 40 mins   Drema DallasWOODS, CURTIS, J , [facility]  [facility] Office  [phone] Pager - [phone]  On-Call/Text Page:      [email or web address] [email or web address]      password [email or web address]  If 7PM-7AM, please contact night-coverage [email or web address] Password [email or web address] 07/25/2014, 5:18 PM   LOS: 1 day   Care during the described time interval was provided by me .  I have reviewed this patient's available data, including medical history, events of note, physical examination, radiology studies and test results as part of my evaluation  Carolyne Littlesurtis Woods,  MD [phone] Pager

## 2014-07-25 NOTE — ED Notes (Signed)
[facility] by right femoral stick.  Pressure dressing applied.
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
## 2014-07-25 NOTE — Progress Notes (Signed)
[facility]  Patient Status: Active (receiving services up to time of hospitalization)  [facility] is providing the following services: RN and MSW  If patient discharges after hours, please call [phone].   Candace BourgeoisMarie [name] 07/25/2014, 6:00 PM

## 2014-07-25 NOTE — ED Notes (Signed)
Patient constantly moaning out loud but denies any other pain other than what she normally has.

## 2014-07-25 NOTE — ED Notes (Signed)
Dr Montez Moritaarter called and will see patient here in the ED.  Ordered to discontinue the Vanc.

## 2014-07-25 NOTE — Progress Notes (Signed)
Pt was refusing lab draws. Lab was able to convince her to do a fingerstick for pm labs. Pt requesting PICC to be placed "downstairs and not at bedside." Pt has 2 patent PIVs. Patient educated on PICC line uses and reasons for insertion. Patient verbalizes understanding but continues to state that she does not want to be stuck for labs "all the time." [facility] paged. Merdis DelayK. Schorr, NP returned call. NP does not believe PICC is necessary at this time, but will let attending MD address it in AM. Patient informed and agreeable to plan.

## 2014-07-25 NOTE — ED Notes (Signed)
Lactic acid- 0.78, Jacubowitz MD made aware.

## 2014-07-26 ENCOUNTER — Inpatient Hospital Stay (HOSPITAL_COMMUNITY): Payer: BLUE CROSS/BLUE SHIELD

## 2014-07-26 DIAGNOSIS — R6520 Severe sepsis without septic shock: Secondary | ICD-10-CM

## 2014-07-26 DIAGNOSIS — J9601 Acute respiratory failure with hypoxia: Secondary | ICD-10-CM

## 2014-07-26 DIAGNOSIS — E43 Unspecified severe protein-calorie malnutrition: Secondary | ICD-10-CM | POA: Diagnosis present

## 2014-07-26 DIAGNOSIS — A419 Sepsis, unspecified organism: Secondary | ICD-10-CM

## 2014-07-26 DIAGNOSIS — N390 Urinary tract infection, site not specified: Secondary | ICD-10-CM

## 2014-07-26 LAB — BLOOD GAS, ARTERIAL
ACID-BASE DEFICIT: 3.5 mmol/L — AB (ref 0.0–2.0)
BICARBONATE: 21.5 meq/L (ref 20.0–24.0)
Delivery systems: POSITIVE
Drawn by: 36527
EXPIRATORY PAP: 6
FIO2: 0.5 %
INSPIRATORY PAP: 14
O2 Saturation: 99 %
PATIENT TEMPERATURE: 97.4
PCO2 ART: 41.2 mmHg (ref 35.0–45.0)
TCO2: 22.8 mmol/L (ref 0–100)
pH, Arterial: 7.334 — ABNORMAL LOW (ref 7.350–7.450)
pO2, Arterial: 147 mmHg — ABNORMAL HIGH (ref 80.0–100.0)

## 2014-07-26 LAB — CBC
HEMATOCRIT: 31.2 % — AB (ref 36.0–46.0)
Hemoglobin: 8.8 g/dL — ABNORMAL LOW (ref 12.0–15.0)
MCH: 23.7 pg — AB (ref 26.0–34.0)
MCHC: 28.2 g/dL — AB (ref 30.0–36.0)
MCV: 84.1 fL (ref 78.0–100.0)
PLATELETS: 328 10*3/uL (ref 150–400)
RBC: 3.71 MIL/uL — ABNORMAL LOW (ref 3.87–5.11)
RDW: 18 % — AB (ref 11.5–15.5)
WBC: 9.5 10*3/uL (ref 4.0–10.5)

## 2014-07-26 LAB — BASIC METABOLIC PANEL
Anion gap: 6 (ref 5–15)
BUN: 12 mg/dL (ref 6–23)
CHLORIDE: 110 mmol/L (ref 96–112)
CO2: 23 mmol/L (ref 19–32)
CREATININE: 0.7 mg/dL (ref 0.50–1.10)
Calcium: 7.4 mg/dL — ABNORMAL LOW (ref 8.4–10.5)
GFR calc Af Amer: >90 mL/min (ref >90)
GFR calc non Af Amer: >90 mL/min (ref >90)
Glucose, Bld: 92 mg/dL (ref 70–99)
Potassium: 3.9 mmol/L (ref 3.5–5.1)
Sodium: 139 mmol/L (ref 135–145)

## 2014-07-26 LAB — TROPONIN I: Troponin I: <0.03 ng/mL (ref <0.031)

## 2014-07-26 LAB — BRAIN NATRIURETIC PEPTIDE: B NATRIURETIC PEPTIDE 5: 92.7 pg/mL (ref 0.0–100.0)

## 2014-07-26 LAB — LACTIC ACID, PLASMA: Lactic Acid, Venous: 0.8 mmol/L (ref 0.5–2.0)

## 2014-07-26 MED ORDER — HEPARIN BOLUS VIA INFUSION
2500.0000 [IU] | Freq: Once | INTRAVENOUS | Status: AC
  Administered 2014-07-26: 2500 [IU] via INTRAVENOUS
  Filled 2014-07-26: qty 2500

## 2014-07-26 MED ORDER — ENOXAPARIN SODIUM 100 MG/ML ~~LOC~~ SOLN
100.0000 mg | Freq: Once | SUBCUTANEOUS | Status: AC
  Administered 2014-07-26: 100 mg via SUBCUTANEOUS
  Filled 2014-07-26 (×2): qty 1

## 2014-07-26 MED ORDER — ENOXAPARIN SODIUM 100 MG/ML ~~LOC~~ SOLN
100.0000 mg | Freq: Two times a day (BID) | SUBCUTANEOUS | Status: DC
  Administered 2014-07-27 – 2014-07-29 (×6): 100 mg via SUBCUTANEOUS
  Filled 2014-07-26 (×9): qty 1

## 2014-07-26 MED ORDER — SODIUM CHLORIDE 0.9 % IV BOLUS (SEPSIS)
1000.0000 mL | Freq: Once | INTRAVENOUS | Status: AC
  Administered 2014-07-26: 1000 mL via INTRAVENOUS

## 2014-07-26 MED ORDER — VANCOMYCIN HCL IN DEXTROSE 750-5 MG/150ML-% IV SOLN
750.0000 mg | Freq: Two times a day (BID) | INTRAVENOUS | Status: DC
  Administered 2014-07-26 – 2014-07-30 (×9): 750 mg via INTRAVENOUS
  Filled 2014-07-26 (×12): qty 150

## 2014-07-26 MED ORDER — LORAZEPAM 2 MG/ML IJ SOLN
INTRAMUSCULAR | Status: AC
  Administered 2014-07-26: 1 mg via INTRAVENOUS
  Filled 2014-07-26: qty 1

## 2014-07-26 MED ORDER — TEMAZEPAM 15 MG PO CAPS
15.0000 mg | ORAL_CAPSULE | Freq: Every day | ORAL | Status: DC
  Filled 2014-07-26: qty 1

## 2014-07-26 MED ORDER — LORAZEPAM 2 MG/ML IJ SOLN
0.5000 mg | INTRAMUSCULAR | Status: DC | PRN
  Administered 2014-07-26 – 2014-07-28 (×5): 1 mg via INTRAVENOUS
  Administered 2014-07-28: 0.5 mg via INTRAVENOUS
  Filled 2014-07-26 (×6): qty 1

## 2014-07-26 MED ORDER — LORAZEPAM 0.5 MG PO TABS: 0.5000 mg | ORAL_TABLET | Freq: Once | ORAL | Status: DC

## 2014-07-26 MED ORDER — HEPARIN (PORCINE) IN NACL 100-0.45 UNIT/ML-% IJ SOLN
800.0000 [IU]/h | INTRAMUSCULAR | Status: DC
  Administered 2014-07-26: 800 [IU]/h via INTRAVENOUS
  Filled 2014-07-26: qty 250

## 2014-07-26 NOTE — Significant Event (Signed)
Pt c/o SOB, tachycardiac in the 110-120s, RR around 30, hypotensive 70-80s/40s. Dr. Sharon SellerMcClung at bedside, received orders for NS bolus, BiPAP, and CT scan of chest. Pt placed on BiPAP per RT, bolus administered per order, awaiting CT scan. HR now in the 90s, SpO2 99%, RR 23. Will continue to monitor pt.
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
## 2014-07-26 NOTE — Progress Notes (Signed)
CRITICAL VALUE ALERT  Critical value received: Blood cultures positive, aerobic and anaerobic, gram + cocci in clusters  Date of notification:  07/26/2014  Time of notification:  0526  Critical value read back:Yes.    Nurse who received alert:  Demetra Shinerelenia, RN MD notified (1st page):  Merdis DelayK. Schorr, NP  Time of first page:  0530  MD notified (2nd page):   Time of second page:  Responding MD:  Merdis DelayK. Schorr, NP  Time MD responded:  [phone]
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
## 2014-07-26 NOTE — Progress Notes (Signed)
At about 0700, pt began to c/o of SOB, "unable to catch" her breath, moaning, sweating, very anxious. Pt initially mid to upper 90s on RA. Placed on 2L for comfort. MD paged. Valli GlanceJillian, RN, taking over and made aware.
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
## 2014-07-26 NOTE — Progress Notes (Signed)
Pt shows no signs of respiratory distress, BIPAP not needed at this time She is on 2L [place] with sats of 100%. RT will continue to monitor.

## 2014-07-26 NOTE — Progress Notes (Signed)
Pt refused AM labs to be drawn. Pt educated on importance of labs and its effect on care. Pt verbalizes understanding, continued to refuse.
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
## 2014-07-27 ENCOUNTER — Inpatient Hospital Stay (HOSPITAL_COMMUNITY): Payer: BLUE CROSS/BLUE SHIELD

## 2014-07-27 DIAGNOSIS — Z978 Presence of other specified devices: Secondary | ICD-10-CM | POA: Diagnosis present

## 2014-07-27 DIAGNOSIS — B9561 Methicillin susceptible Staphylococcus aureus infection as the cause of diseases classified elsewhere: Secondary | ICD-10-CM

## 2014-07-27 DIAGNOSIS — Z9889 Other specified postprocedural states: Secondary | ICD-10-CM

## 2014-07-27 DIAGNOSIS — A4101 Sepsis due to Methicillin susceptible Staphylococcus aureus: Secondary | ICD-10-CM | POA: Diagnosis present

## 2014-07-27 DIAGNOSIS — A419 Sepsis, unspecified organism: Secondary | ICD-10-CM | POA: Diagnosis present

## 2014-07-27 DIAGNOSIS — D649 Anemia, unspecified: Secondary | ICD-10-CM

## 2014-07-27 DIAGNOSIS — R6520 Severe sepsis without septic shock: Secondary | ICD-10-CM

## 2014-07-27 DIAGNOSIS — E440 Moderate protein-calorie malnutrition: Secondary | ICD-10-CM | POA: Diagnosis present

## 2014-07-27 DIAGNOSIS — E43 Unspecified severe protein-calorie malnutrition: Secondary | ICD-10-CM

## 2014-07-27 DIAGNOSIS — L89150 Pressure ulcer of sacral region, unstageable: Secondary | ICD-10-CM

## 2014-07-27 DIAGNOSIS — Z89511 Acquired absence of right leg below knee: Secondary | ICD-10-CM

## 2014-07-27 DIAGNOSIS — L89159 Pressure ulcer of sacral region, unspecified stage: Secondary | ICD-10-CM | POA: Diagnosis present

## 2014-07-27 DIAGNOSIS — G8220 Paraplegia, unspecified: Secondary | ICD-10-CM

## 2014-07-27 DIAGNOSIS — Z89512 Acquired absence of left leg below knee: Secondary | ICD-10-CM

## 2014-07-27 DIAGNOSIS — J9600 Acute respiratory failure, unspecified whether with hypoxia or hypercapnia: Secondary | ICD-10-CM | POA: Diagnosis present

## 2014-07-27 DIAGNOSIS — M546 Pain in thoracic spine: Secondary | ICD-10-CM

## 2014-07-27 DIAGNOSIS — Z960 Presence of urogenital implants: Secondary | ICD-10-CM

## 2014-07-27 DIAGNOSIS — M4644 Discitis, unspecified, thoracic region: Secondary | ICD-10-CM | POA: Diagnosis present

## 2014-07-27 DIAGNOSIS — I5032 Chronic diastolic (congestive) heart failure: Secondary | ICD-10-CM

## 2014-07-27 DIAGNOSIS — I509 Heart failure, unspecified: Secondary | ICD-10-CM

## 2014-07-27 DIAGNOSIS — J9621 Acute and chronic respiratory failure with hypoxia: Secondary | ICD-10-CM

## 2014-07-27 DIAGNOSIS — G9340 Encephalopathy, unspecified: Secondary | ICD-10-CM

## 2014-07-27 DIAGNOSIS — R7881 Bacteremia: Secondary | ICD-10-CM

## 2014-07-27 LAB — COMPREHENSIVE METABOLIC PANEL
ALT: 16 U/L (ref 0–35)
AST: 12 U/L (ref 0–37)
Albumin: 1.3 g/dL — ABNORMAL LOW (ref 3.5–5.2)
Alkaline Phosphatase: 66 U/L (ref 39–117)
Anion gap: 5 (ref 5–15)
BUN: 12 mg/dL (ref 6–23)
CALCIUM: 7.9 mg/dL — AB (ref 8.4–10.5)
CHLORIDE: 113 mmol/L — AB (ref 96–112)
CO2: 23 mmol/L (ref 19–32)
CREATININE: 0.66 mg/dL (ref 0.50–1.10)
GFR calc Af Amer: >90 mL/min (ref >90)
Glucose, Bld: 76 mg/dL (ref 70–99)
Potassium: 3.9 mmol/L (ref 3.5–5.1)
Sodium: 141 mmol/L (ref 135–145)
Total Bilirubin: 0.6 mg/dL (ref 0.3–1.2)
Total Protein: 5.2 g/dL — ABNORMAL LOW (ref 6.0–8.3)

## 2014-07-27 LAB — CBC
HCT: 28.1 % — ABNORMAL LOW (ref 36.0–46.0)
Hemoglobin: 8.2 g/dL — ABNORMAL LOW (ref 12.0–15.0)
MCH: 24.3 pg — AB (ref 26.0–34.0)
MCHC: 29.2 g/dL — ABNORMAL LOW (ref 30.0–36.0)
MCV: 83.1 fL (ref 78.0–100.0)
Platelets: 336 10*3/uL (ref 150–400)
RBC: 3.38 MIL/uL — ABNORMAL LOW (ref 3.87–5.11)
RDW: 18.3 % — ABNORMAL HIGH (ref 11.5–15.5)
WBC: 6.1 10*3/uL (ref 4.0–10.5)

## 2014-07-27 LAB — URINE CULTURE

## 2014-07-27 LAB — BLOOD GAS, ARTERIAL
Acid-base deficit: 4.4 mmol/L — ABNORMAL HIGH (ref 0.0–2.0)
Bicarbonate: 22.5 mEq/L (ref 20.0–24.0)
Drawn by: 252031
FIO2: 1 %
O2 Saturation: 94.2 %
Patient temperature: 98.6
TCO2: 24.3 mmol/L (ref 0–100)
pCO2 arterial: 59.5 mmHg (ref 35.0–45.0)
pH, Arterial: 7.202 — ABNORMAL LOW (ref 7.350–7.450)
pO2, Arterial: 85.9 mmHg (ref 80.0–100.0)

## 2014-07-27 LAB — MAGNESIUM: Magnesium: 1.4 mg/dL — ABNORMAL LOW (ref 1.5–2.5)

## 2014-07-27 MED ORDER — MUSCLE RUB 10-15 % EX CREA
TOPICAL_CREAM | CUTANEOUS | Status: DC | PRN
  Filled 2014-07-27: qty 85

## 2014-07-27 MED ORDER — WHITE PETROLATUM GEL
Status: AC
  Administered 2014-07-27: 0.2
  Filled 2014-07-27: qty 1

## 2014-07-27 MED ORDER — MAGNESIUM SULFATE IN D5W 10-5 MG/ML-% IV SOLN
1.0000 g | Freq: Once | INTRAVENOUS | Status: AC
  Administered 2014-07-27: 1 g via INTRAVENOUS
  Filled 2014-07-27: qty 100

## 2014-07-27 MED ORDER — HALOPERIDOL LACTATE 5 MG/ML IJ SOLN
2.0000 mg | Freq: Four times a day (QID) | INTRAMUSCULAR | Status: DC
  Filled 2014-07-27: qty 0.4
  Filled 2014-07-27: qty 1
  Filled 2014-07-27 (×3): qty 0.4

## 2014-07-27 MED ORDER — LORAZEPAM 2 MG/ML IJ SOLN
2.0000 mg | Freq: Once | INTRAMUSCULAR | Status: DC
  Filled 2014-07-27: qty 1

## 2014-07-27 MED ORDER — HALOPERIDOL LACTATE 5 MG/ML IJ SOLN
2.0000 mg | Freq: Once | INTRAMUSCULAR | Status: DC
  Filled 2014-07-27: qty 1

## 2014-07-27 MED ORDER — TECHNETIUM TO 99M ALBUMIN AGGREGATED
3.0000 | Freq: Once | INTRAVENOUS | Status: AC | PRN
Start: 2014-07-27 — End: 2014-07-27

## 2014-07-27 NOTE — Progress Notes (Signed)
  Echocardiogram 2D Echocardiogram has been performed.  [name], [name] 07/27/2014, 3:19 PM

## 2014-07-27 NOTE — Progress Notes (Signed)
Pt refused ct 1800
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
## 2014-07-27 NOTE — Progress Notes (Signed)
Pt requesting topical cream for muscle pain for her back. [facility] paged. Merdis DelayK. Schorr, NP returned page. New orders given. Will continue to monitor patient.

## 2014-07-27 NOTE — Progress Notes (Signed)
This RN along with Romeo AppleBen, Rapid Response RN spoke with patient and patients husband. Plan explained and both patient and husband are agreeable to plan for MRI and CT. Husband will accompany patient downstairs to provide support, will wait in waiting area during procedure. MRI called and awaiting transport down to procedural area. Will continue to monitor

## 2014-07-27 NOTE — Progress Notes (Signed)
Pt IV leaking, IV team called to place new IV. New IV placed.

## 2014-07-28 ENCOUNTER — Inpatient Hospital Stay (HOSPITAL_COMMUNITY): Payer: BLUE CROSS/BLUE SHIELD

## 2014-07-28 DIAGNOSIS — J9621 Acute and chronic respiratory failure with hypoxia: Secondary | ICD-10-CM

## 2014-07-28 LAB — BLOOD GAS, ARTERIAL
ACID-BASE DEFICIT: 3.7 mmol/L — AB (ref 0.0–2.0)
ACID-BASE DEFICIT: 0.9 mmol/L (ref 0.0–2.0)
Acid-base deficit: 1.5 mmol/L (ref 0.0–2.0)
BICARBONATE: 23.5 meq/L (ref 20.0–24.0)
Bicarbonate: 22.2 mEq/L (ref 20.0–24.0)
Bicarbonate: 24.5 mEq/L — ABNORMAL HIGH (ref 20.0–24.0)
DELIVERY SYSTEMS: POSITIVE
DRAWN BY: 36527
Delivery systems: POSITIVE
Drawn by: 36496
Drawn by: 24513
EXPIRATORY PAP: 8
Expiratory PAP: 8
FIO2: 0.5 %
FIO2: 0.5 %
FIO2: 0.6 %
Inspiratory PAP: 18
Inspiratory PAP: 18
LHR: 16 {breaths}/min
O2 SAT: 97.2 %
O2 SAT: 100 %
O2 Saturation: 91.2 %
PATIENT TEMPERATURE: 99.5
PEEP: 5 cmH2O
PH ART: 7.279 — AB (ref 7.350–7.450)
PH ART: 7.37 (ref 7.350–7.450)
PO2 ART: 65.1 mmHg — AB (ref 80.0–100.0)
Patient temperature: 98.6
Patient temperature: 97.5
RATE: 10 resp/min
RATE: 10 resp/min
TCO2: 23.8 mmol/L (ref 0–100)
TCO2: 26.2 mmol/L (ref 0–100)
TCO2: 24.8 mmol/L (ref 0–100)
VT: 500 mL
pCO2 arterial: 50.3 mmHg — ABNORMAL HIGH (ref 35.0–45.0)
pCO2 arterial: 53.6 mmHg — ABNORMAL HIGH (ref 35.0–45.0)
pCO2 arterial: 41.9 mmHg (ref 35.0–45.0)
pH, Arterial: 7.268 — ABNORMAL LOW (ref 7.350–7.450)
pO2, Arterial: 95.5 mmHg (ref 80.0–100.0)
pO2, Arterial: 242 mmHg — ABNORMAL HIGH (ref 80.0–100.0)

## 2014-07-28 LAB — CULTURE, BLOOD (ROUTINE X 2)

## 2014-07-28 LAB — BASIC METABOLIC PANEL
Anion gap: <5 — ABNORMAL LOW (ref 5–15)
BUN: 12 mg/dL (ref 6–23)
CHLORIDE: 112 mmol/L (ref 96–112)
CO2: 26 mmol/L (ref 19–32)
Calcium: 7.9 mg/dL — ABNORMAL LOW (ref 8.4–10.5)
Creatinine, Ser: 0.73 mg/dL (ref 0.50–1.10)
GFR calc Af Amer: >90 mL/min (ref >90)
Glucose, Bld: 75 mg/dL (ref 70–99)
POTASSIUM: 4 mmol/L (ref 3.5–5.1)
Sodium: 140 mmol/L (ref 135–145)

## 2014-07-28 LAB — RESPIRATORY VIRUS PANEL
Adenovirus: NEGATIVE
INFLUENZA B 1: NEGATIVE
Influenza A: NEGATIVE
Metapneumovirus: NEGATIVE
PARAINFLUENZA 1 A: NEGATIVE
PARAINFLUENZA 2 A: NEGATIVE
Parainfluenza 3: NEGATIVE
RESPIRATORY SYNCYTIAL VIRUS A: NEGATIVE
RHINOVIRUS: NEGATIVE
Respiratory Syncytial Virus B: NEGATIVE

## 2014-07-28 LAB — CBC WITH DIFFERENTIAL/PLATELET
BASOS PCT: 0 % (ref 0–1)
Basophils Absolute: 0 10*3/uL (ref 0.0–0.1)
EOS PCT: 0 % (ref 0–5)
Eosinophils Absolute: 0 10*3/uL (ref 0.0–0.7)
HCT: 29.7 % — ABNORMAL LOW (ref 36.0–46.0)
Hemoglobin: 8.5 g/dL — ABNORMAL LOW (ref 12.0–15.0)
LYMPHS ABS: 0.7 10*3/uL (ref 0.7–4.0)
Lymphocytes Relative: 10 % — ABNORMAL LOW (ref 12–46)
MCH: 23.9 pg — ABNORMAL LOW (ref 26.0–34.0)
MCHC: 28.6 g/dL — ABNORMAL LOW (ref 30.0–36.0)
MCV: 83.4 fL (ref 78.0–100.0)
MONO ABS: 0.4 10*3/uL (ref 0.1–1.0)
MONOS PCT: 6 % (ref 3–12)
NEUTROS PCT: 84 % — AB (ref 43–77)
Neutro Abs: 6.1 10*3/uL (ref 1.7–7.7)
PLATELETS: 427 10*3/uL — AB (ref 150–400)
RBC: 3.56 MIL/uL — ABNORMAL LOW (ref 3.87–5.11)
RDW: 18.3 % — AB (ref 11.5–15.5)
WBC: 7.2 10*3/uL (ref 4.0–10.5)

## 2014-07-28 LAB — GLUCOSE, CAPILLARY: Glucose-Capillary: 81 mg/dL (ref 70–99)

## 2014-07-28 LAB — MAGNESIUM: Magnesium: 2.1 mg/dL (ref 1.5–2.5)

## 2014-07-28 LAB — SEDIMENTATION RATE: Sed Rate: 101 mm/hr — ABNORMAL HIGH (ref 0–22)

## 2014-07-28 LAB — C-REACTIVE PROTEIN: CRP: 13.3 mg/dL — AB (ref <0.60)

## 2014-07-28 MED ORDER — FENTANYL CITRATE 0.05 MG/ML IJ SOLN
INTRAMUSCULAR | Status: AC
  Administered 2014-07-28: 100 ug
  Filled 2014-07-28: qty 2

## 2014-07-28 MED ORDER — SODIUM CHLORIDE 0.9 % IV SOLN: INTRAVENOUS | Status: DC | PRN

## 2014-07-28 MED ORDER — FUROSEMIDE 10 MG/ML IJ SOLN
40.0000 mg | Freq: Two times a day (BID) | INTRAMUSCULAR | Status: DC
  Administered 2014-07-28 – 2014-08-03 (×13): 40 mg via INTRAVENOUS
  Filled 2014-07-28 (×17): qty 4

## 2014-07-28 MED ORDER — CHLORHEXIDINE GLUCONATE 0.12 % MT SOLN
15.0000 mL | Freq: Two times a day (BID) | OROMUCOSAL | Status: DC
  Administered 2014-07-28 – 2014-08-09 (×23): 15 mL via OROMUCOSAL
  Filled 2014-07-28 (×22): qty 15

## 2014-07-28 MED ORDER — LEVOFLOXACIN IN D5W 750 MG/150ML IV SOLN
750.0000 mg | INTRAVENOUS | Status: DC
  Administered 2014-07-28 – 2014-07-30 (×3): 750 mg via INTRAVENOUS
  Filled 2014-07-28 (×5): qty 150

## 2014-07-28 MED ORDER — MIDAZOLAM HCL 2 MG/2ML IJ SOLN
1.0000 mg | Freq: Once | INTRAMUSCULAR | Status: AC
  Administered 2014-07-28: 2 mg via INTRAVENOUS

## 2014-07-28 MED ORDER — HALOPERIDOL LACTATE 5 MG/ML IJ SOLN
4.0000 mg | Freq: Three times a day (TID) | INTRAMUSCULAR | Status: DC
  Administered 2014-07-28 – 2014-08-04 (×19): 4 mg via INTRAVENOUS
  Filled 2014-07-28: qty 0.8
  Filled 2014-07-28: qty 1
  Filled 2014-07-28 (×9): qty 0.8
  Filled 2014-07-28: qty 1
  Filled 2014-07-28 (×7): qty 0.8
  Filled 2014-07-28: qty 1
  Filled 2014-07-28 (×6): qty 0.8
  Filled 2014-07-28: qty 1
  Filled 2014-07-28: qty 0.8

## 2014-07-28 MED ORDER — FUROSEMIDE 10 MG/ML IJ SOLN
INTRAMUSCULAR | Status: AC
  Filled 2014-07-28: qty 4

## 2014-07-28 MED ORDER — LIDOCAINE HCL (PF) 1 % IJ SOLN
INTRAMUSCULAR | Status: AC
  Filled 2014-07-28: qty 5

## 2014-07-28 MED ORDER — CETYLPYRIDINIUM CHLORIDE 0.05 % MT LIQD
7.0000 mL | Freq: Four times a day (QID) | OROMUCOSAL | Status: DC
  Administered 2014-07-29 – 2014-08-09 (×47): 7 mL via OROMUCOSAL

## 2014-07-28 MED ORDER — MIDAZOLAM HCL 2 MG/2ML IJ SOLN
2.0000 mg | INTRAMUSCULAR | Status: DC | PRN
  Administered 2014-07-29 – 2014-08-01 (×6): 4 mg via INTRAVENOUS
  Administered 2014-08-02: 2 mg via INTRAVENOUS
  Filled 2014-07-28 (×6): qty 4
  Filled 2014-07-28 (×2): qty 2

## 2014-07-28 MED ORDER — FENTANYL CITRATE 0.05 MG/ML IJ SOLN
25.0000 ug | INTRAMUSCULAR | Status: DC | PRN
  Administered 2014-07-29 – 2014-08-01 (×6): 100 ug via INTRAVENOUS
  Filled 2014-07-28 (×6): qty 2

## 2014-07-28 MED ORDER — MIDAZOLAM HCL 2 MG/2ML IJ SOLN
INTRAMUSCULAR | Status: AC
Start: 2014-07-28 — End: 2014-07-28
  Administered 2014-07-28: 2 mg
  Filled 2014-07-28: qty 2

## 2014-07-28 MED ORDER — MIDAZOLAM HCL 2 MG/2ML IJ SOLN
INTRAMUSCULAR | Status: AC
  Administered 2014-07-28: 2 mg
  Filled 2014-07-28: qty 4

## 2014-07-28 MED ORDER — FENTANYL CITRATE 0.05 MG/ML IJ SOLN: 25.0000 ug | INTRAMUSCULAR | Status: DC | PRN

## 2014-07-28 MED ORDER — SODIUM CHLORIDE 0.9 % IV SOLN
INTRAVENOUS | Status: DC
Start: 2014-07-28 — End: 2014-08-09
  Administered 2014-07-28: 10 mL/h via INTRAVENOUS
  Administered 2014-07-29: 22:00:00 via INTRAVENOUS
  Administered 2014-08-02: 10 mL/h via INTRAVENOUS
  Administered 2014-08-04 – 2014-08-07 (×3): via INTRAVENOUS

## 2014-07-28 MED ORDER — FAMOTIDINE IN NACL 20-0.9 MG/50ML-% IV SOLN
20.0000 mg | Freq: Two times a day (BID) | INTRAVENOUS | Status: DC
  Administered 2014-07-28 – 2014-08-03 (×12): 20 mg via INTRAVENOUS
  Filled 2014-07-28 (×13): qty 50

## 2014-07-28 NOTE — Progress Notes (Addendum)
Patient and husband wanted the BiPAP removed from patient, they do not want her wearing it. Pt and husband educated regarding BiPAP, verbalize understanding but they continue to want it removed. Pt placed on Ventimask 10L, increased to 14L. Mittens were placed overnight on patient and husband who was agreeable with them earlier tonight no longer wants them on, so they were removed. Pt is agitated and continues to want to get the mask off. Britta MccreedyBarbara, RN made aware of ongoing situation.  CCM paged. Dr. Solon AugustaBynum made aware of patient refusing BiPAP.

## 2014-07-28 NOTE — Procedures (Signed)
Intubation Procedure Note [name] [id number] February 26, 1961  Procedure: Intubation Indications: Respiratory insufficiency  Procedure Details Consent: Unable to obtain consent because of emergent medical necessity. Time Out: Verified patient identification, verified procedure, site/side was marked, verified correct patient position, special equipment/implants available, medications/allergies/relevent history reviewed, required imaging and test results available.  Performed  4   Evaluation Hemodynamic Status: BP stable throughout; O2 sats: stable throughout Patient's Current Condition: stable Complications: No apparent complications Patient did tolerate procedure well. Chest X-ray ordered to verify placement.  CXR: pending.  Of note: patient's first ET tube had a leaky cuff, thus ET tube exchange was done using bougie and glidescope view   [name] 07/28/2014

## 2014-07-28 NOTE — Progress Notes (Signed)
eLink Physician-Brief Progress Note Patient Name: Candace MaidBabbie A Haggart DOB: 1960-09-25 MRN: [id number]   Date of Service  07/28/2014  HPI/Events of Note  Patient intubated for resp failure with concern for volume overload.  Had received lasix earlier in AM.  Now on vent  eICU Interventions  Plan: Vent orders Currently on prn sedation with fentanyl/versed - will see if this holds patient Best practice with enoxaparin and pepcid Since concern for volume overload with KVO fluids     Intervention Category Intermediate Interventions: Respiratory distress - evaluation and management  DETERDING,ELIZABETH 07/28/2014, 9:27 PM

## 2014-07-28 NOTE — Significant Event (Signed)
Rapid Response Event Note  Overview: Time Called: 1220 Arrival Time: 1221 Event Type: Respiratory  Initial Focused Assessment: on arrival pt is moderate resp distress, attempting to remove NRB, LS diminished on right side.  HR 130, sats 100%, resps 24, BP 163/77.  Pt confused and uncooperative.      Interventions:  [facility] NP Tom contacted, orders for ABG, Bipap, CXR, EKG.  Candace RoachPaul Hoffman, NP to bedside.  Pt placed on Bipap, NTS performed by RT with substantial mucus removed.  Rechecked pt at 0150 - tolerating Bipap better.  HR 115, resps 20, sats 100%.  Repeat ABG improved.  LS improved.  Pt calmer.  Plan to monitor pt closely.  Husband at bedside throughout event, updated and agrees with plan of care.  Possibility of intubation discussed with husband who agrees.  Bedside handoff given to Celenia, RN and will monitor pt.    Event Summary: Name of Physician Notified: Candace Pastelom Callahan, NP at 1230  Name of Consulting Physician Notified: [name], PCCM NP at 1245  Outcome: Stayed in room and stabalized     YuleeMOORE, [name]

## 2014-07-28 NOTE — Progress Notes (Signed)
About 2300 patient began c/o SOB, became very restless, complaining of not being able to catch her breath. Pt became tachycardic in the 130s. Was on RA but had started to desat into 80s. Nasal cannula placed and oxygen had to be increased to 6L. Lungs diminished throughout. Respiratory called to evaluate patient. Pt unable to maintain stats, placed on ventimask, NRB then bipap. Was able to maintain stats on bipap. Benedetto Coons. Callahan, NP paged and informed of situation.Rapid response was alerted to situation and came to evaluate patient. Pt was NTS by respiratory, thick clear/white secretion, and lung sounds improved as a result. STAT ABG and CXR ordered. EKG performed due to patients change in HR. CCM consulted. Joneen RoachPaul hoffman, NP with Critical Care came to assess pt. Repeat ABG completed. Benedetto Coons. Callahan, NP and P. Mikey BussingHoffman, NP notified of results. Pt to remain in SDU and will continue to be monitored.  Pt became confused and very uncooperative through ordeal. Pt tries to remove bipap at times and is reeducated on its importance. Pt remains on BiPap and is tolerating well. Will continue to monitor patient.

## 2014-07-28 NOTE — Progress Notes (Signed)
Bedside ultrasound done after intubation, right side. There was no significant pleural effusion, mostly consolidated lung, compatible with atelectasis d/t mucus plug. Needs suctioning. If does not resolve by AM consider therapeutic bronchoscopy.

## 2014-07-28 NOTE — Care Management (Addendum)
Spoke with patient's nursing this morning unfortunately head CT and C-spine MRI when it unable to be accomplished secondary to patient dropping her SPO2 at approximately the time she was scheduled to go down to radiology studies. RN Susy Manorelenia Y Sosa agrees that the Ativan has taken the edge off of patient's combativeness however patient continues to be uncooperative with her diagnostic/therapeutic therapy. Will increase Haldol to 4 mg TID

## 2014-07-28 NOTE — Progress Notes (Signed)
Pt refused blood cultures and lab draws that were scheduled for 2/4 late evening. Pt educated about importance of having blood drawn and how it affects her plan of care. Pt and husband verbalize understanding and continue to refuse labs being drawn.

## 2014-07-28 NOTE — Consult Note (Signed)
WOC wound consult note Reason for Consult:  Evaluation of ulcer on buttocks.  Pt with longstanding history of pressure ulcers on the sacrum, however this ulcer is well healed. Scar tissue only. Wound type: no open wounds at this time.  Pt on air mattress currently for pressure redistribution since she is high risk for breakdown.  Family understands importance of turning and repositioning even with the use of air mattress.  Nurse reports some issues under the pannus and breasts, however I do not note any redness today or any other skin issues under the breast at this time.  Suggested sacral prophylactic dressing since the patient has a high risk for skin breakdown.   Discussed POC with patient and bedside nurse.  Re consult if needed, will not follow at this time. Thanks  Dakwon Wenberg [employer] RN, CWOCN [phone])

## 2014-07-28 NOTE — Progress Notes (Signed)
[facility]    Subjective: Patient on BiPAP and having blood cultures drawn  Antibiotics:  Anti-infectives    Start     Dose/Rate Route Frequency Ordered Stop   07/26/14 0600  vancomycin (VANCOCIN) IVPB 750 mg/150 ml premix     750 mg75 mL/hr over 120 Minutes Intravenous Every 12 hours 07/26/14 0545     07/26/14 0200  levofloxacin (LEVAQUIN) IVPB 750 mg  Status:  Discontinued     750 mg100 mL/hr over 90 Minutes Intravenous Every 24 hours 07/25/14 0704 07/27/14 1152   07/25/14 0800  aztreonam (AZACTAM) 2 g in dextrose 5 % 50 mL IVPB  Status:  Discontinued     2 g100 mL/hr over 30 Minutes Intravenous Every 8 hours 07/25/14 0705 07/26/14 1723   07/25/14 0000  levofloxacin (LEVAQUIN) IVPB 750 mg     750 mg100 mL/hr over 90 Minutes Intravenous  Once Aug 07, 2014 2349 07/25/14 0325   07/25/14 0000  aztreonam (AZACTAM) 2 g in dextrose 5 % 50 mL IVPB     2 g100 mL/hr over 30 Minutes Intravenous  Once 2014/08/07 2349 07/25/14 0146   07/25/14 0000  vancomycin (VANCOCIN) IVPB 1000 mg/200 mL premix     1,000 mg200 mL/hr over 60 Minutes Intravenous  Once 08/07/14 2349 07/25/14 0435   07/25/14 0000  levofloxacin (LEVAQUIN) IVPB 750 mg  Status:  Discontinued     750 mg100 mL/hr over 90 Minutes Intravenous  Once 08-07-14 2352 08-07-14 2354   07/25/14 0000  aztreonam (AZACTAM) 2 g in dextrose 5 % 50 mL IVPB  Status:  Discontinued     2 g100 mL/hr over 30 Minutes Intravenous  Once 08-07-14 2352 Aug 07, 2014 2354   07/25/14 0000  vancomycin (VANCOCIN) IVPB 1000 mg/200 mL premix  Status:  Discontinued     1,000 mg200 mL/hr over 60 Minutes Intravenous  Once 07-Aug-2014 2352 08/07/14 2354      Medications: Scheduled Meds: . enoxaparin (LOVENOX) injection  100 mg Subcutaneous Q12H  . furosemide      . furosemide  40 mg Intravenous Q12H  . haloperidol lactate  4 mg Intravenous 3 times per day  . LORazepam  2 mg Intravenous Once  . sodium chloride  3 mL Intravenous Q12H  . temazepam   15 mg Oral QHS  . vancomycin  750 mg Intravenous Q12H   Continuous Infusions:  PRN Meds:.HYDROcodone-acetaminophen, LORazepam, morphine injection, MUSCLE RUB, ondansetron **OR** ondansetron (ZOFRAN) IV    Objective: Weight change: -1 lb (-0.454 kg)  Intake/Output Summary (Last 24 hours) at 07/28/14 1333 Last data filed at 07/28/14 0800  Gross per 24 hour  Intake    370 ml  Output   1200 ml  Net   -830 ml   Blood pressure 154/115, pulse 83, temperature 97.4 F (36.3 C), temperature source Oral, resp. rate 17, height 4\' 7"  (1.397 m), weight 220 lb (99.791 kg), SpO2 100 %. Temp:  [97.4 F (36.3 C)-98.3 F (36.8 C)] 97.4 F (36.3 C) (02/05 0705) Pulse Rate:  [83-128] 83 (02/05 1244) Resp:  [13-31] 17 (02/05 1244) BP: (134-188)/(67-115) 154/115 mmHg (02/05 1131) SpO2:  [87 %-100 %] 100 % (02/05 1244) FiO2 (%):  [80 %] 80 % (02/05 0412) Weight:  [220 lb (99.791 kg)] 220 lb (99.791 kg) (02/05 0412)  Physical Exam: General: Alert and awake, on BiPAP  CBC: CBC Latest Ref Rng 07/28/2014 07/27/2014 07/26/2014  WBC 4.0 - 10.5 K/uL 7.2 6.1 9.5  Hemoglobin 12.0 - 15.0 g/dL 4.1(L) 2.4(M)  8.8(L)  Hematocrit 36.0 - 46.0 % 29.7(L) 28.1(L) 31.2(L)  Platelets 150 - 400 K/uL 427(H) 336 328      BMET  Recent Labs  07/27/14 0323 07/28/14 0305  NA 141 140  K 3.9 4.0  CL 113* 112  CO2 23 26  GLUCOSE 76 75  BUN 12 12  CREATININE 0.66 0.73  CALCIUM 7.9* 7.9*     Liver Panel   Recent Labs  07/27/14 0323  PROT 5.2*  ALBUMIN 1.3*  AST 12  ALT 16  ALKPHOS 66  BILITOT 0.6       Sedimentation Rate  Recent Labs  07/28/14 0305  ESRSEDRATE 101*   C-Reactive Protein  Recent Labs  07/28/14 0305  CRP 13.3*    Micro Results: Recent Results (from the past 720 hour(s))  Urine culture     Status: None   Collection Time: 06/28/14  7:34 PM  Result Value Ref Range Status   Specimen Description URINE, CATHETERIZED  Final   Special Requests NONE  Final   Colony Count    Final    >=100,000 COLONIES/ML Performed at [facility]    Culture   Final    ACINETOBACTER CALCOACETICUS/BAUMANNII COMPLEX Performed at [facility]    Report Status 06/30/2014 FINAL  Final   Organism ID, Bacteria ACINETOBACTER CALCOACETICUS/BAUMANNII COMPLEX  Final      Susceptibility   Acinetobacter calcoaceticus/baumannii complex - MIC*    CEFTRIAXONE 16 INTERMEDIATE Intermediate     CIPROFLOXACIN <=0.25 SENSITIVE Sensitive     GENTAMICIN <=1 SENSITIVE Sensitive     PIP/TAZO <=4 SENSITIVE Sensitive     TOBRAMYCIN <=1 SENSITIVE Sensitive     TRIMETH/SULFA <=20 SENSITIVE Sensitive     LEVOFLOXACIN <=0.12 SENSITIVE Sensitive     NITROFURANTOIN >=512 RESISTANT Resistant     * ACINETOBACTER CALCOACETICUS/BAUMANNII COMPLEX  Blood Culture (routine x 2)     Status: None   Collection Time: 07/25/14  1:00 AM  Result Value Ref Range Status   Specimen Description BLOOD RIGHT FEMORAL ARTERY  Final   Special Requests BOTTLES DRAWN AEROBIC AND ANAEROBIC 5CC EA  Final   Culture   Final    STAPHYLOCOCCUS AUREUS Note: RIFAMPIN AND GENTAMICIN SHOULD NOT BE USED AS SINGLE DRUGS FOR TREATMENT OF STAPH INFECTIONS. Note: Gram Stain Report Called to,Read Back By and Verified With: CELENIE SOSA 07/26/14 5:30AM THOMI/ORTCH Performed at [facility]    Report Status 07/28/2014 FINAL  Final   Organism ID, Bacteria STAPHYLOCOCCUS AUREUS  Final      Susceptibility   Staphylococcus aureus - MIC*    CLINDAMYCIN <=0.25 SENSITIVE Sensitive     ERYTHROMYCIN <=0.25 SENSITIVE Sensitive     GENTAMICIN <=0.5 SENSITIVE Sensitive     LEVOFLOXACIN 0.25 SENSITIVE Sensitive     OXACILLIN 0.5 SENSITIVE Sensitive     PENICILLIN >=0.5 RESISTANT Resistant     RIFAMPIN <=0.5 SENSITIVE Sensitive     TRIMETH/SULFA <=10 SENSITIVE Sensitive     VANCOMYCIN 1 SENSITIVE Sensitive     TETRACYCLINE <=1 SENSITIVE Sensitive     MOXIFLOXACIN <=0.25 SENSITIVE Sensitive     * STAPHYLOCOCCUS AUREUS  Blood  Culture (routine x 2)     Status: None (Preliminary result)   Collection Time: 07/25/14  1:00 AM  Result Value Ref Range Status   Specimen Description BLOOD RIGHT ARM  Final   Special Requests BOTTLES DRAWN AEROBIC ONLY 2CC  Final   Culture   Final  BLOOD CULTURE RECEIVED NO GROWTH TO DATE CULTURE WILL BE HELD FOR 5 DAYS BEFORE ISSUING A FINAL NEGATIVE REPORT Note: Culture results may be compromised due to an inadequate volume of blood received in culture bottles. Performed at [facility]    Report Status PENDING  Incomplete  Urine culture     Status: None   Collection Time: 07/25/14  1:27 AM  Result Value Ref Range Status   Specimen Description URINE, CATHETERIZED  Final   Special Requests NONE  Final   Colony Count   Final    80,000 COLONIES/ML Performed at [facility]    Culture   Final    PROTEUS MIRABILIS Performed at [facility]    Report Status 07/27/2014 FINAL  Final   Organism ID, Bacteria PROTEUS MIRABILIS  Final      Susceptibility   Proteus mirabilis - MIC*    AMPICILLIN <=2 SENSITIVE Sensitive     CEFAZOLIN 8 SENSITIVE Sensitive     CEFTRIAXONE <=1 SENSITIVE Sensitive     CIPROFLOXACIN >=4 RESISTANT Resistant     GENTAMICIN 8 INTERMEDIATE Intermediate     LEVOFLOXACIN >=8 RESISTANT Resistant     NITROFURANTOIN 128 RESISTANT Resistant     TOBRAMYCIN 8 INTERMEDIATE Intermediate     TRIMETH/SULFA >=320 RESISTANT Resistant     PIP/TAZO <=4 SENSITIVE Sensitive     * PROTEUS MIRABILIS  Respiratory virus panel (routine influenza)     Status: None   Collection Time: 07/25/14 12:13 PM  Result Value Ref Range Status   Respiratory Syncytial Virus A Negative Negative Final   Respiratory Syncytial Virus B Negative Negative Final   Influenza A Negative Negative Final   Influenza B Negative Negative Final   Parainfluenza 1 Negative Negative Final   Parainfluenza 2 Negative Negative Final   Parainfluenza 3 Negative Negative Final    Metapneumovirus Negative Negative Final   Rhinovirus Negative Negative Final   Adenovirus Negative Negative Final    Comment: (NOTE) Performed At: [facility] [place] [address] [place], [place] [id number] Mila HomerHancock William F MD NW:[phone]   MRSA PCR Screening     Status: None   Collection Time: 07/25/14  2:59 PM  Result Value Ref Range Status   MRSA by PCR NEGATIVE NEGATIVE Final    Comment:        The GeneXpert MRSA Assay (FDA approved for NASAL specimens only), is one component of a comprehensive MRSA colonization surveillance program. It is not intended to diagnose MRSA infection nor to guide or monitor treatment for MRSA infections.     Studies/Results: Ct Head Wo Contrast  07/28/2014   CLINICAL DATA:  54 year old with altered mental status.  EXAM: CT HEAD WITHOUT CONTRAST  TECHNIQUE: Contiguous axial images were obtained from the base of the skull through the vertex without contrast.  COMPARISON:  None  FINDINGS: Additional images were obtained due to motion artifact. No evidence for acute hemorrhage, mass lesion, midline shift, hydrocephalus or large infarct. Small calcifications in left basal ganglia. No acute bone abnormality. Incomplete fusion of C1 is a normal variant.  IMPRESSION: No acute intracranial abnormality.   Electronically Signed   By: Richarda OverlieAdam  Henn M.D.   On: 07/28/2014 11:01   Nm Pulmonary Perfusion  07/27/2014   EXAM: NUCLEAR MEDICINE VENTILATION - PERFUSION LUNG SCAN  TECHNIQUE: Patient unable to tolerate the ventilation portion of the examination. Perfusion images were obtained in multiple projections after intravenous injection of Tc-4633m MAA. Not all projections could be obtained.  RADIOPHARMACEUTICALS:  3 mCi Tc-30m MAA.  COMPARISON:  Chest x-ray 07/27/2014  FINDINGS: Extremely limited study secondary to patient related factors. Incomplete perfusion imaging demonstrates elevation of the right hemidiaphragm. There is a small wedge-shaped perfusion defect in  the lateral base of the left lung.  IMPRESSION: Extremely limited perfusion study without ventilation secondary to patient refusal.  At most, the study is low probability for acute PE.   Electronically Signed   By: Malachy Moan M.D.   On: 07/27/2014 18:15   Dg Chest Port 1 View  07/28/2014   CLINICAL DATA:  Dyspnea.  EXAM: PORTABLE CHEST - 1 VIEW  COMPARISON:  One-view chest 07/28/2014.  FINDINGS: The heart is enlarged. Lung volumes are low. A large right pleural effusion is again seen. The fusion at shifted with patient positioning. The right lung base is obscured. Mild atelectasis at the left base is stable. There is mild edema.  IMPRESSION: 1. Persistent large right pleural effusion obscuring the right lower lobe. 2. Stable cardiomegaly and mild edema. 3. Mild left basilar atelectasis.   Electronically Signed   By: Gennette Pac M.D.   On: 07/28/2014 07:27   Dg Chest Port 1 View  07/28/2014   CLINICAL DATA:  Acute onset of shortness of breath and cough for 1 day. Initial encounter.  EXAM: PORTABLE CHEST - 1 VIEW  COMPARISON:  Chest radiograph performed 07/27/2014  FINDINGS: The lungs are hypoexpanded. A loculated moderate to large right-sided pleural effusion is noted, and a small left pleural effusion is seen. Mildly increased interstitial markings could reflect mild interstitial edema. No pneumothorax is identified.  The cardiomediastinal silhouette is borderline enlarged. No acute osseous abnormalities are seen.  IMPRESSION: Lungs hypoexpanded. Loculated moderate to large right-sided pleural effusion, and small left pleural effusion. The pleural effusions are increased in size from the prior study. Mildly increased interstitial markings could reflect mild interstitial edema. Borderline cardiomegaly.   Electronically Signed   By: Roanna Raider M.D.   On: 07/28/2014 03:38   Dg Chest Port 1 View  07/27/2014   CLINICAL DATA:  Dyspnea  EXAM: PORTABLE CHEST - 1 VIEW  COMPARISON:  07/26/2014  FINDINGS:  Progressive left suprahilar lung opacity.  Unchanged elevation of the right diaphragm. Heart size and aortic contours remain negative for technique. No edema, effusion, or pneumothorax.  Focal narrowing of the T10-11 disc space was present on recent studies but not clearly seen on abdominal CT from December 2015.  These results were called by telephone at the time of interpretation on 07/27/2014 at 8:11 am to [facility], who verbally acknowledged these results in well ring 2 position attention around spur.  IMPRESSION: 1. Progressive left hilar opacity, suspicious for pneumonia. 2. Focal T10-11 disc narrowing, not seen on CT imaging from 2015. If there is acute back pain in this septic patient, recommend dedicated spinal imaging to excluded discitis.   Electronically Signed   By: Tiburcio Pea M.D.   On: 07/27/2014 08:13      Assessment/Plan:  Active Problems:   Sepsis   Sepsis associated hypotension   Sepsis due to urinary tract infection   Essential hypertension   Moderate malnutrition   Anemia of chronic disease   Protein-calorie malnutrition, severe   Staphylococcus aureus bacteremia with sepsis   Discitis of thoracic region   Acute respiratory failure   Acute encephalopathy   Severe sepsis   Bacteremia due to Staphylococcus aureus   Acute on chronic respiratory failure with hypoxia   Midline thoracic back pain  Sacral decubitus ulcer   Chronic diastolic CHF (congestive heart failure)   Normocytic anemia   Chronic indwelling Foley catheter   Paraplegia   S/P BKA (below knee amputation) bilateral   Moderate protein-calorie malnutrition    [name] is a 54 y.o. female with  . female with Paralysis, admitted with sepsis found to have methicillin sensitive Staphylococcus aureus bacteremia.  #1    [place] [facility] Staphylococcus aureus bacteremia   Staphylococcus aureus bacteremia (SAB) is associated with a  high rate of complications and mortality. Specific aspects of clinical management are critical to optimizing the outcome of patients with SAB. Therefore, the [facility] Antimicrobial Management Team [facility]) has initiated an intervention aimed at improving the management of SAB at [facility]. To do so, Infectious Diseases physicians are providing an evidence-based consult for the management of all patients with SAB.     Yes No Comments  Perform follow-up blood cultures (even if the patient is afebrile) to ensure clearance of bacteremia [X]  [ ]   blood cultures being drawn today with difficulty   Remove vascular catheter and obtain follow-up blood cultures after the removal of the catheter [ ]  [ ]  DO NOT PLACE PICC LINE UNTIL WE HAVE NO GROWTH ON REPEAT BLOOD CULTURES DRAWN TODAY FOR AT LEAST 72 HOURS  Perform echocardiography to evaluate for endocarditis (transthoracic ECHO is 40-50% sensitive, TEE is > 90% sensitive) [ ]  [ ]  Please keep in mind, that neither test can definitively EXCLUDE endocarditis, and that should clinical suspicion remain high for endocarditis the patient should then still be treated with an "endocarditis" duration of therapy = 6 weeks ordered  TTE SHOWS SMALL PERICARDIAL EFFUSION. WOULD NOT PURSUE TEE IN THIS PT  Consult electrophysiologist to evaluate implanted cardiac device (pacemaker, ICD) [ ]  [ ]    Ensure source control [ ]  [ ]  Have all abscesses been drained effectively? Have deep seeded infections (septic joints or osteomyelitis) had appropriate surgical debridement?  Not clear what source is pneumonia? Her back  Investigate for "metastatic" sites of infection [ ]  [ ]  Does the patient have ANY symptom or physical exam finding that would suggest a deeper infection (back or neck pain that may be suggestive of vertebral osteomyelitis or epidural abscess, muscle pain that could be a symptom of pyomyositis)?  Keep in mind that for deep seeded  infections MRI imaging with contrast is preferred rather than other often insensitive tests such as plain x-rays, especially early in a patient's presentation.   GIVEN HER SIGNIFICANT BACK PAIN AND FINDINGS ON PLAIN FILM OF CHEST I WOULD RECOMMEND EITHER MRI T SPINE   Change antibiotic therapy to VANCOMYCIN (She has beta lactam allergies and could not get anything from son or pt today that would make me feel comfortable giving a beta lactam  [ ]  [ ]  Beta-lactam antibiotics are preferred for MSSA due to higher cure rates.  If on Vancomycin, goal trough should be 15 - 20 mcg/mL  Estimated duration of IV antibiotic therapy: 8 weeks esp if she has diskitis [ ]  [ ]  Consult case management for probably prolonged outpatient IV antibiotic therapy   #2 CAP?: I feel comfortable with jsut vancomycin at this point, she has worsening effusions possibly due to volume overload  #3 UTI? Colony counts not impressive will dc gram negative abx  #4 Screening: check HIV and hep panel  Dr. Drue Second will be covering this weekend and is available for questions.   LOS: 4 days  Paulette Blanch Dam 07/28/2014, 1:33 PM
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
## 2014-07-28 NOTE — Procedures (Signed)
Central Venous Catheter Insertion Procedure Note Rubie MaidBabbie A Eifert [id number] 06-21-1961  Procedure: Insertion of Central Venous Catheter Indications: Assessment of intravascular volume, Drug and/or fluid administration and Frequent blood sampling  Procedure Details Consent: Risks of procedure as well as the alternatives and risks of each were explained to the (patient/caregiver).  Consent for procedure obtained. Time Out: Verified patient identification, verified procedure, site/side was marked, verified correct patient position, special equipment/implants available, medications/allergies/relevent history reviewed, required imaging and test results available.  Performed Real time US used to ID and cannulate the vessel   Maximum sterile technique was used including antiseptics, cap, gloves, gown, hand hygiene, mask and sheet. Skin prep: Chlorhexidine; local anesthetic administered A antimicrobial bonded/coated triple lumen catheter was placed in the left internal jugular vein using the Seldinger technique.  Evaluation Blood flow good Complications: No apparent complications Patient did tolerate procedure well. Chest X-ray ordered to verify placement.  CXR: pending.  BABCOCK,PETE 07/28/2014, 4:43 PM  Billy Fischeravid [place], MD ; [facility] service Mobile [phone].  After 5:30 PM or weekends, call [phone]

## 2014-07-28 NOTE — Progress Notes (Signed)
ANTIBIOTIC CONSULT NOTE - INITIAL  Pharmacy Consult for Levaquin Indication: HCAP  Allergies  Allergen Reactions  . Iodine-131 Hives  . Vancomycin Nausea And Vomiting    Note from [facility]: Tolerated vancomycin run in over 2 hours  . Zosyn [Piperacillin Sod-Tazobactam So] Swelling  . Iohexol Rash    Patient Measurements: Height:  (139.7 cm) Weight: 220 lb (99.791 kg) IBW/kg (Calculated) : 34  Vital Signs: Temp: 97.5 F (36.4 C) (02/05 1912) Temp Source: Axillary (02/05 1912) BP: 120/55 mmHg (02/05 1912) Pulse Rate: 89 (02/05 1912) Intake/Output from previous day: 02/04 0701 - 02/05 0700 In: 790 [P.O.:240; I.V.:300; IV Piggyback:250] Out: 1050 [Urine:1050] Intake/Output from this shift: Total I/O In: -  Out: 200 [Urine:200]  Labs:  Recent Labs  07/26/14 0950 07/27/14 0323 07/28/14 0305  WBC 9.5 6.1 7.2  HGB 8.8* 8.2* 8.5*  PLT 328 336 427*  CREATININE 0.70 0.66 0.73   Estimated Creatinine Clearance: 78.3 mL/min (by C-G formula based on Cr of 0.73). No results for input(s): VANCOTROUGH, VANCOPEAK, VANCORANDOM, GENTTROUGH, GENTPEAK, GENTRANDOM, TOBRATROUGH, TOBRAPEAK, TOBRARND, AMIKACINPEAK, AMIKACINTROU, AMIKACIN in the last 72 hours.   Microbiology: Recent Results (from the past 720 hour(s))  Blood Culture (routine x 2)     Status: None   Collection Time: 07/25/14  1:00 AM  Result Value Ref Range Status   Specimen Description BLOOD RIGHT FEMORAL ARTERY  Final   Special Requests BOTTLES DRAWN AEROBIC AND ANAEROBIC 5CC EA  Final   Culture   Final    STAPHYLOCOCCUS AUREUS Note: RIFAMPIN AND GENTAMICIN SHOULD NOT BE USED AS SINGLE DRUGS FOR TREATMENT OF STAPH INFECTIONS. Note: Gram Stain Report Called to,Read Back By and Verified With: CELENIE SOSA 07/26/14 5:30AM THOMI/ORTCH Performed at [facility]    Report Status 07/28/2014 FINAL  Final   Organism ID, Bacteria STAPHYLOCOCCUS AUREUS  Final      Susceptibility   Staphylococcus aureus - MIC*   CLINDAMYCIN <=0.25 SENSITIVE Sensitive     ERYTHROMYCIN <=0.25 SENSITIVE Sensitive     GENTAMICIN <=0.5 SENSITIVE Sensitive     LEVOFLOXACIN 0.25 SENSITIVE Sensitive     OXACILLIN 0.5 SENSITIVE Sensitive     PENICILLIN >=0.5 RESISTANT Resistant     RIFAMPIN <=0.5 SENSITIVE Sensitive     TRIMETH/SULFA <=10 SENSITIVE Sensitive     VANCOMYCIN 1 SENSITIVE Sensitive     TETRACYCLINE <=1 SENSITIVE Sensitive     MOXIFLOXACIN <=0.25 SENSITIVE Sensitive     * STAPHYLOCOCCUS AUREUS  Blood Culture (routine x 2)     Status: None (Preliminary result)   Collection Time: 07/25/14  1:00 AM  Result Value Ref Range Status   Specimen Description BLOOD RIGHT ARM  Final   Special Requests BOTTLES DRAWN AEROBIC ONLY 2CC  Final   Culture   Final           BLOOD CULTURE RECEIVED NO GROWTH TO DATE CULTURE WILL BE HELD FOR 5 DAYS BEFORE ISSUING A FINAL NEGATIVE REPORT Note: Culture results may be compromised due to an inadequate volume of blood received in culture bottles. Performed at [facility]    Report Status PENDING  Incomplete  Urine culture     Status: None   Collection Time: 07/25/14  1:27 AM  Result Value Ref Range Status   Specimen Description URINE, CATHETERIZED  Final   Special Requests NONE  Final   Colony Count   Final    80,000 COLONIES/ML Performed at [facility]    Culture   Final  PROTEUS MIRABILIS [facility]formed at [facility]    Report Status 07/27/2014 FINAL  Final   Organism ID, Bacteria PROTEUS MIRABILIS  Final      Susceptibility   Proteus mirabilis - MIC*    AMPICILLIN <=2 SENSITIVE Sensitive     CEFAZOLIN 8 SENSITIVE Sensitive     CEFTRIAXONE <=1 SENSITIVE Sensitive     CIPROFLOXACIN >=4 RESISTANT Resistant     GENTAMICIN 8 INTERMEDIATE Intermediate     LEVOFLOXACIN >=8 RESISTANT Resistant     NITROFURANTOIN 128 RESISTANT Resistant     TOBRAMYCIN 8 INTERMEDIATE Intermediate     TRIMETH/SULFA >=320 RESISTANT Resistant     PIP/TAZO <=4 SENSITIVE  Sensitive     * PROTEUS MIRABILIS  Respiratory virus panel (routine influenza)     Status: None   Collection Time: 07/25/14 12:13 PM  Result Value Ref Range Status   Respiratory Syncytial Virus A Negative Negative Final   Respiratory Syncytial Virus B Negative Negative Final   Influenza A Negative Negative Final   Influenza B Negative Negative Final   Parainfluenza 1 Negative Negative Final   Parainfluenza 2 Negative Negative Final   Parainfluenza 3 Negative Negative Final   Metapneumovirus Negative Negative Final   Rhinovirus Negative Negative Final   Adenovirus Negative Negative Final    Comment: (NOTE) Performed At: [facility] [address] [place], [place] [id number] Mila HomerHancock William F MD NW:[id number]:[phone]   MRSA PCR Screening     Status: None   Collection Time: 07/25/14  2:59 PM  Result Value Ref Range Status   MRSA by PCR NEGATIVE NEGATIVE Final    Comment:        The GeneXpert MRSA Assay (FDA approved for NASAL specimens only), is one component of a comprehensive MRSA colonization surveillance program. It is not intended to diagnose MRSA infection nor to guide or monitor treatment for MRSA infections.    Assessment: 54 y.o. female well known to pharmacy from current antibiotic and anticoagulation dosing. Pt currently on Vancomycin (Day #4) for MSSA bacteremia (unable to narrow as pt with beta lactam allergy). Order to broaden antibiotics with Levaquin for HCAP. Pt received Levaquin earlier this admission and just d/c yesterday. SCr stable. ID following.  Vancomycin 2/2> Aztreonam 2/2> 2/3 Levaquin 2/2>2/4; restart 2/5>>  Urine cx 2/2>Proteus - resistant to Levaquin (per MD -likely not an issue with chronic foley) BC x 2 2/2 > GPC in 1 of 2>>MSSA Repeat BC X 2 ordered>  Goal of Therapy:  Resolution of infection  Plan:  1. Levaquin 750mg  IV q24h 2. Will f/u renal function, micro data, pt's clinical condition  Christoper Fabianaron Charleston Hankin, PharmD, BCPS Clinical  pharmacist, pager [phone] 07/28/2014,9:21 PM
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
## 2014-07-28 NOTE — Progress Notes (Signed)
Called to patients room for increased respiratory distress. Placed patient on Bipap 12/6 with 70%. Patients tidal volumes very low 175-225, increased IPAP to 18cm with slight increase in tidal volumes. Patients right lung sounds decreased with rhonchi. Nasal tracheal suctioned patient with thick copious clear return. Patient fighting with staff not wanting to wear BIPAP so patient placed on 100% NRB mask. Arterial blood gas drawn and reported to Lenny Pastelom Callahan who is on call for this patient. Ordered to place patient back on Bipap and obtain another blood gas after two hours. Patient kept pulling bipap mask off, so patient was placed back on 100%NRB. X-ray in room with results pending.
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
## 2014-07-28 NOTE — Progress Notes (Signed)
eLink Physician-Brief Progress Note Patient Name: [name] DOB: 1960-11-04 MRN: [id number]   Date of Service  07/28/2014  HPI/Events of Note  More letharguic Stat abg   eICU Interventions  Move to icu Will have MS see pt likley to need ETT US chest rt?      Intervention Category Major Interventions: Change in mental status - evaluation and management;Respiratory failure - evaluation and management  Nelda BucksFEINSTEIN,DANIEL J. 07/28/2014, 7:22 PM

## 2014-07-28 NOTE — Progress Notes (Signed)
Nursing Note: Patient arrived at on the unit via bed with RN and NT.  Patient remained on bariatric bed and converted to ICU monitoring systems as appropriate.  E-Link notified or arrival to the unit and CCM at bedside.  RT prepared equipment for emergent intubation by CCM with glide scope.  Medications given for intubation at 2022 by RN per MD verbal order include: Versed 2mg s, Fentanyl 50mcgs, and Amidate 40mg s.  Oral ET-tube 7.5 verified per MD and RT per protocol however patient noted to have cuff leak.  Bougie used to switch out oral ET-tube again verification per MD and RT. Medications given during Bougie procedure at 2026 by RN per MD verbal order include: Versed 2mg s and Fentanyl 50mcgs.  Stat chest x-ray ordered per verbal order and X-Ray department notified.  Bedside report and transition of care given to on-call RN at [facility].

## 2014-07-29 ENCOUNTER — Inpatient Hospital Stay (HOSPITAL_COMMUNITY): Payer: BLUE CROSS/BLUE SHIELD

## 2014-07-29 DIAGNOSIS — G9340 Encephalopathy, unspecified: Secondary | ICD-10-CM

## 2014-07-29 DIAGNOSIS — J9601 Acute respiratory failure with hypoxia: Secondary | ICD-10-CM

## 2014-07-29 DIAGNOSIS — T783XXA Angioneurotic edema, initial encounter: Secondary | ICD-10-CM

## 2014-07-29 LAB — CBC WITH DIFFERENTIAL/PLATELET
BASOS ABS: 0 10*3/uL (ref 0.0–0.1)
Basophils Relative: 0 % (ref 0–1)
EOS ABS: 0.1 10*3/uL (ref 0.0–0.7)
Eosinophils Relative: 2 % (ref 0–5)
HCT: 28.6 % — ABNORMAL LOW (ref 36.0–46.0)
Hemoglobin: 8.5 g/dL — ABNORMAL LOW (ref 12.0–15.0)
LYMPHS PCT: 24 % (ref 12–46)
Lymphs Abs: 1.5 10*3/uL (ref 0.7–4.0)
MCH: 24.2 pg — ABNORMAL LOW (ref 26.0–34.0)
MCHC: 29.7 g/dL — AB (ref 30.0–36.0)
MCV: 81.5 fL (ref 78.0–100.0)
MONO ABS: 0.5 10*3/uL (ref 0.1–1.0)
MONOS PCT: 8 % (ref 3–12)
Neutro Abs: 4.1 10*3/uL (ref 1.7–7.7)
Neutrophils Relative %: 66 % (ref 43–77)
Platelets: 425 10*3/uL — ABNORMAL HIGH (ref 150–400)
RBC: 3.51 MIL/uL — ABNORMAL LOW (ref 3.87–5.11)
RDW: 18.3 % — ABNORMAL HIGH (ref 11.5–15.5)
WBC: 6.2 10*3/uL (ref 4.0–10.5)

## 2014-07-29 LAB — BASIC METABOLIC PANEL
ANION GAP: 7 (ref 5–15)
ANION GAP: 14 (ref 5–15)
BUN: 11 mg/dL (ref 6–23)
BUN: 11 mg/dL (ref 6–23)
CALCIUM: 7.7 mg/dL — AB (ref 8.4–10.5)
CO2: 27 mmol/L (ref 19–32)
CO2: 24 mmol/L (ref 19–32)
CREATININE: 0.85 mg/dL (ref 0.50–1.10)
CREATININE: 0.98 mg/dL (ref 0.50–1.10)
Calcium: 7.9 mg/dL — ABNORMAL LOW (ref 8.4–10.5)
Chloride: 104 mmol/L (ref 96–112)
Chloride: 103 mmol/L (ref 96–112)
GFR calc Af Amer: 76 mL/min — ABNORMAL LOW (ref >90)
GFR calc non Af Amer: 77 mL/min — ABNORMAL LOW (ref >90)
GFR, EST AFRICAN AMERICAN: 90 mL/min — AB (ref >90)
GFR, EST NON AFRICAN AMERICAN: 65 mL/min — AB (ref >90)
GLUCOSE: 87 mg/dL (ref 70–99)
Glucose, Bld: 71 mg/dL (ref 70–99)
POTASSIUM: 3.1 mmol/L — AB (ref 3.5–5.1)
POTASSIUM: 2.7 mmol/L — AB (ref 3.5–5.1)
SODIUM: 138 mmol/L (ref 135–145)
Sodium: 141 mmol/L (ref 135–145)

## 2014-07-29 LAB — BLOOD GAS, ARTERIAL
ACID-BASE EXCESS: 1.2 mmol/L (ref 0.0–2.0)
Bicarbonate: 24.1 mEq/L — ABNORMAL HIGH (ref 20.0–24.0)
Drawn by: 24513
FIO2: 0.4 %
LHR: 16 {breaths}/min
MECHVT: 500 mL
O2 Saturation: 94.5 %
PEEP: 5 cmH2O
PH ART: 7.512 — AB (ref 7.350–7.450)
PO2 ART: 59.4 mmHg — AB (ref 80.0–100.0)
Patient temperature: 98.6
TCO2: 25 mmol/L (ref 0–100)
pCO2 arterial: 30.2 mmHg — ABNORMAL LOW (ref 35.0–45.0)

## 2014-07-29 LAB — GLUCOSE, CAPILLARY
GLUCOSE-CAPILLARY: 70 mg/dL (ref 70–99)
GLUCOSE-CAPILLARY: 75 mg/dL (ref 70–99)
GLUCOSE-CAPILLARY: 77 mg/dL (ref 70–99)
Glucose-Capillary: 73 mg/dL (ref 70–99)
Glucose-Capillary: 80 mg/dL (ref 70–99)
Glucose-Capillary: 100 mg/dL — ABNORMAL HIGH (ref 70–99)

## 2014-07-29 LAB — TRIGLYCERIDES: Triglycerides: 90 mg/dL (ref <150)

## 2014-07-29 MED ORDER — POTASSIUM CHLORIDE 20 MEQ/15ML (10%) PO SOLN
40.0000 meq | Freq: Once | ORAL | Status: AC
  Administered 2014-07-29: 40 meq via ORAL
  Filled 2014-07-29: qty 30

## 2014-07-29 MED ORDER — PROPOFOL 10 MG/ML IV EMUL
0.0000 ug/kg/min | INTRAVENOUS | Status: DC
  Administered 2014-07-29: 5 ug/kg/min via INTRAVENOUS
  Administered 2014-07-29 – 2014-07-30 (×4): 25 ug/kg/min via INTRAVENOUS
  Administered 2014-07-31: 20 ug/kg/min via INTRAVENOUS
  Filled 2014-07-29 (×6): qty 100

## 2014-07-29 MED ORDER — PROPOFOL 10 MG/ML IV EMUL
INTRAVENOUS | Status: AC
  Administered 2014-07-29: 5 ug/kg/min via INTRAVENOUS
  Filled 2014-07-29: qty 100

## 2014-07-29 MED ORDER — POTASSIUM CHLORIDE 20 MEQ/15ML (10%) PO SOLN
60.0000 meq | Freq: Once | ORAL | Status: AC
  Administered 2014-07-29: 60 meq via ORAL
  Filled 2014-07-29: qty 45

## 2014-07-29 NOTE — Progress Notes (Signed)
eLink Physician-Brief Progress Note Patient Name: Candace MaidBabbie A Olvera DOB: 1960/10/06 MRN: [id number]   Date of Service  07/29/2014  HPI/Events of Note  Low K this am not replaced  eICU Interventions  Repeat BMP now KCL 60 meq orally     Intervention Category Minor Interventions: Electrolytes abnormality - evaluation and management  Henry RusselSMITH, Kysen Wetherington, P 07/29/2014, 7:00 PM
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
## 2014-07-29 NOTE — Progress Notes (Signed)
[facility]    Date of Admission:  08/18/2014   Total days of antibiotics 6        Day 6 vanco        Day 5 levoflox           ID: [name] is a 54 y.o. female with paraplegia admitted for sepsis due to MSSA bacteremia concern for discitis  Active Problems:   Sepsis   Sepsis associated hypotension   Sepsis due to urinary tract infection   Essential hypertension   Moderate malnutrition   Anemia of chronic disease   Protein-calorie malnutrition, severe   Staphylococcus aureus bacteremia with sepsis   Discitis of thoracic region   Acute respiratory failure   Acute encephalopathy   Severe sepsis   Bacteremia due to Staphylococcus aureus   Acute on chronic respiratory failure with hypoxia   Midline thoracic back pain   Sacral decubitus ulcer   Chronic diastolic CHF (congestive heart failure)   Normocytic anemia   Chronic indwelling Foley catheter   Paraplegia   S/P BKA (below knee amputation) bilateral   Moderate protein-calorie malnutrition    Subjective: Afebrile, overnight had worsening respiratory distress requiring intubation. Seen by woc rn who felt wounds re not infected  Medications:  . antiseptic oral rinse  7 mL Mouth Rinse QID  . chlorhexidine  15 mL Mouth Rinse BID  . enoxaparin (LOVENOX) injection  100 mg Subcutaneous Q12H  . famotidine (PEPCID) IV  20 mg Intravenous Q12H  . furosemide  40 mg Intravenous Q12H  . haloperidol lactate  4 mg Intravenous 3 times per day  . levofloxacin (LEVAQUIN) IV  750 mg Intravenous Q24H  . sodium chloride  3 mL Intravenous Q12H  . vancomycin  750 mg Intravenous Q12H    Objective: Vital signs in last 24 hours: Temp:  [97.5 F (36.4 C)-99.1 F (37.3 C)] 98.3 F (36.8 C) (02/06 0745) Pulse Rate:  [59-100] 100 (02/06 0700) Resp:  [14-24] 16 (02/06 0700) BP: (100-157)/(55-115) 100/65 mmHg (02/06 0700) SpO2:  [94 %-100 %] 100 % (02/06 0700) FiO2 (%):  [40 %] 40 % (02/06 0412) Weight:  [227  lb (102.967 kg)] 227 lb (102.967 kg) (02/06 0304) Physical Exam  Constitutional:  Sedated. intubated. appears well-developed and well-nourished. No distress.  HENT: ETT in place, left ij in place Cardiovascular: Normal rate, regular rhythm and normal heart sounds. Exam reveals no gallop and no friction rub.  No murmur heard.  Pulmonary/Chest: Effort normal and breath sounds normal. No respiratory distress.  has no wheezes.  Abdominal: Soft. Decreased bowel sounds.  exhibits no distension. There is no tenderness.  Lymphadenopathy: no cervical adenopathy.  Skin: bilateral amputation no ulcerations and wounds.     Lab Results  Recent Labs  07/28/14 0305 07/29/14 0430  WBC 7.2 6.2  HGB 8.5* 8.5*  HCT 29.7* 28.6*  NA 140 138  K 4.0 3.1*  CL 112 104  CO2 26 27  BUN 12 11  CREATININE 0.73 0.85   Liver Panel  Recent Labs  07/27/14 0323  PROT 5.2*  ALBUMIN 1.3*  AST 12  ALT 16  ALKPHOS 66  BILITOT 0.6   Sedimentation Rate  Recent Labs  07/28/14 0305  ESRSEDRATE 101*   C-Reactive Protein  Recent Labs  07/28/14 0305  CRP 13.3*    Microbiology: 2/5 blood cx ngtd 2/2 blood cx x 2 MSSA 2/2 RVP negative Studies/Results: Ct Head Wo Contrast  07/28/2014   CLINICAL  DATA:  54 year old with altered mental status.  EXAM: CT HEAD WITHOUT CONTRAST  TECHNIQUE: Contiguous axial images were obtained from the base of the skull through the vertex without contrast.  COMPARISON:  None  FINDINGS: Additional images were obtained due to motion artifact. No evidence for acute hemorrhage, mass lesion, midline shift, hydrocephalus or large infarct. Small calcifications in left basal ganglia. No acute bone abnormality. Incomplete fusion of C1 is a normal variant.  IMPRESSION: No acute intracranial abnormality.   Electronically Signed   By: Richarda Overlie M.D.   On: 07/28/2014 11:01   Nm Pulmonary Perfusion  07/27/2014   EXAM: NUCLEAR MEDICINE VENTILATION - PERFUSION LUNG SCAN  TECHNIQUE:  Patient unable to tolerate the ventilation portion of the examination. Perfusion images were obtained in multiple projections after intravenous injection of Tc-17m MAA. Not all projections could be obtained.  RADIOPHARMACEUTICALS:  3 mCi Tc-46m MAA.  COMPARISON:  Chest x-ray 07/27/2014  FINDINGS: Extremely limited study secondary to patient related factors. Incomplete perfusion imaging demonstrates elevation of the right hemidiaphragm. There is a small wedge-shaped perfusion defect in the lateral base of the left lung.  IMPRESSION: Extremely limited perfusion study without ventilation secondary to patient refusal.  At most, the study is low probability for acute PE.   Electronically Signed   By: Malachy Moan M.D.   On: 07/27/2014 18:15   Portable Chest Xray  07/29/2014   CLINICAL DATA:  Respiratory failure requiring intubation  EXAM: PORTABLE CHEST - 1 VIEW  COMPARISON:  07/28/2014  FINDINGS: Orogastric tube identified with tip over the anticipated position of the antrum. ET tip 2.4cm above carina. Left IJ line unchanged.  Low lung volumes. Mild pulmonary venous congestionstable. Stable bibasilar atelectasis.  IMPRESSION: No active disease.   Electronically Signed   By: Esperanza Heir M.D.   On: 07/29/2014 09:12   Dg Chest Port 1 View  07/28/2014   CLINICAL DATA:  Subsequent encounter for intubation.  EXAM: PORTABLE CHEST - 1 VIEW  COMPARISON:  Earlier today at 1608 hr.  FINDINGS: 2142 hr. Endotracheal tube is either at the carina or entering the right mainstem bronchus. Nasogastric tube terminates at the distal stomach. A left internal jugular line terminates at the low SVC.  Proximal right femoral fixation. Normal heart size for level of inspiration. No pleural effusion or pneumothorax. Low lung volumes. Overall improved aeration, with mild interstitial prominence. Patchy left base atelectasis remains. Nearly completely resolved right base airspace disease.  IMPRESSION: Endotracheal tube either at the  carina are entering the right mainstem bronchus. Recommend retraction 3-4 cm. Finding called to the patient's nurse at the time of interpretation at 10:05 p.m.on 07/28/2014.  Improved aeration with mild venous congestion and low lung volumes remaining. Bibasilar atelectasis is improved.   Electronically Signed   By: Jeronimo Greaves M.D.   On: 07/28/2014 22:05   Dg Chest Port 1 View  07/28/2014   CLINICAL DATA:  Sepsis, left IJ central venous catheter placement  EXAM: PORTABLE CHEST - 1 VIEW  COMPARISON:  07/28/2014  FINDINGS: Cardiomegaly again noted. Mild interstitial edema again noted. Persistent right pleural effusion with right lower lobe atelectasis or infiltrate. Mild left basilar atelectasis. There is left IJ central line with tip in SVC right atrium junction. No pneumothorax.  IMPRESSION: Persistent cardiomegaly and mild interstitial edema. Right pleural effusion with right lower lobe atelectasis or infiltrate. Left IJ central line with tip in SVC right atrium junction. No pneumothorax.   Electronically Signed   By: Natasha Mead  M.D.   On: 07/28/2014 16:33   Dg Chest Port 1 View  07/28/2014   CLINICAL DATA:  Dyspnea.  EXAM: PORTABLE CHEST - 1 VIEW  COMPARISON:  One-view chest 07/28/2014.  FINDINGS: The heart is enlarged. Lung volumes are low. A large right pleural effusion is again seen. The fusion at shifted with patient positioning. The right lung base is obscured. Mild atelectasis at the left base is stable. There is mild edema.  IMPRESSION: 1. Persistent large right pleural effusion obscuring the right lower lobe. 2. Stable cardiomegaly and mild edema. 3. Mild left basilar atelectasis.   Electronically Signed   By: Gennette Pac M.D.   On: 07/28/2014 07:27   Dg Chest Port 1 View  07/28/2014   CLINICAL DATA:  Acute onset of shortness of breath and cough for 1 day. Initial encounter.  EXAM: PORTABLE CHEST - 1 VIEW  COMPARISON:  Chest radiograph performed 07/27/2014  FINDINGS: The lungs are hypoexpanded.  A loculated moderate to large right-sided pleural effusion is noted, and a small left pleural effusion is seen. Mildly increased interstitial markings could reflect mild interstitial edema. No pneumothorax is identified.  The cardiomediastinal silhouette is borderline enlarged. No acute osseous abnormalities are seen.  IMPRESSION: Lungs hypoexpanded. Loculated moderate to large right-sided pleural effusion, and small left pleural effusion. The pleural effusions are increased in size from the prior study. Mildly increased interstitial markings could reflect mild interstitial edema. Borderline cardiomegaly.   Electronically Signed   By: Roanna Raider M.D.   On: 07/28/2014 03:38   Dg Abd Portable 1v  07/28/2014   CLINICAL DATA:  Subsequent encounter for orogastric tube placement.  EXAM: PORTABLE ABDOMEN - 1 VIEW  COMPARISON:  Earlier today chest radiograph  FINDINGS: Orogastric tube terminates at the distal stomach. Patient rotated right. Nonobstructive bowel gas pattern. Cholecystectomy clips in the right upper quadrant. IVC filter.  IMPRESSION: Orogastric tube terminating at the distal stomach.   Electronically Signed   By: Jeronimo Greaves M.D.   On: 07/28/2014 22:06     Assessment/Plan: MSSA bacteremia =  Continue on vancomycin due to presumed angioedema allergy to piptazo noted in her chart. Repeat cultures are pending. TTE does not show evidence of any vegetations       [facility] Staphylococcus aureus bacteremia   Staphylococcus aureus bacteremia (SAB) is associated with a high rate of complications and mortality.  Specific aspects of clinical management are critical to optimizing the outcome of patients with SAB.  Therefore, the [facility] Antimicrobial Management Team [facility]) has initiated an intervention aimed at improving the management of SAB at [facility].  To do so, Infectious Diseases physicians are providing an evidence-based consult for the management of all  patients with SAB.     Yes No Comments  Perform follow-up blood cultures (even if the patient is afebrile) to ensure clearance of bacteremia [x]  []  2/5  Remove vascular catheter and obtain follow-up blood cultures after the removal of the catheter []  [x]  New IJ placed on 2/5, should be ok, but will follow up on blood cx from 2/5  Perform echocardiography to evaluate for endocarditis (transthoracic ECHO is 40-50% sensitive, TEE is > 90% sensitive) [x]  []  TTE is negative but lower sensitivity than TEE. Consider TEE when patient gets extubated       Ensure source control [x]  []  Repeat cx  Investigate for "metastatic" sites of infection []  [x]  Please have imaging of spine to see if evidence of discitis  Continue on  antibiotic therapy to _vancomycin []  []   Vancomycin, goal trough should be 15 - 20 mcg/mL  Estimated duration of IV antibiotic therapy:  6 wk  []  []  Based upon current data     Drue SecondSNIDER, [facility] Cell: [phone] Pager: [phone]  07/29/2014, 10:30 AM
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
## 2014-07-30 ENCOUNTER — Inpatient Hospital Stay (HOSPITAL_COMMUNITY): Payer: BLUE CROSS/BLUE SHIELD

## 2014-07-30 LAB — GLUCOSE, CAPILLARY
GLUCOSE-CAPILLARY: 86 mg/dL (ref 70–99)
Glucose-Capillary: 79 mg/dL (ref 70–99)
Glucose-Capillary: 84 mg/dL (ref 70–99)
Glucose-Capillary: 87 mg/dL (ref 70–99)
Glucose-Capillary: 80 mg/dL (ref 70–99)
Glucose-Capillary: 89 mg/dL (ref 70–99)

## 2014-07-30 LAB — CBC WITH DIFFERENTIAL/PLATELET
BASOS ABS: 0 10*3/uL (ref 0.0–0.1)
Basophils Relative: 1 % (ref 0–1)
EOS ABS: 0.2 10*3/uL (ref 0.0–0.7)
Eosinophils Relative: 3 % (ref 0–5)
HCT: 28.8 % — ABNORMAL LOW (ref 36.0–46.0)
Hemoglobin: 8.9 g/dL — ABNORMAL LOW (ref 12.0–15.0)
LYMPHS PCT: 26 % (ref 12–46)
Lymphs Abs: 1.6 10*3/uL (ref 0.7–4.0)
MCH: 24.1 pg — ABNORMAL LOW (ref 26.0–34.0)
MCHC: 30.9 g/dL (ref 30.0–36.0)
MCV: 77.8 fL — ABNORMAL LOW (ref 78.0–100.0)
Monocytes Absolute: 0.6 10*3/uL (ref 0.1–1.0)
Monocytes Relative: 9 % (ref 3–12)
NEUTROS PCT: 61 % (ref 43–77)
Neutro Abs: 3.8 10*3/uL (ref 1.7–7.7)
Platelets: 432 10*3/uL — ABNORMAL HIGH (ref 150–400)
RBC: 3.7 MIL/uL — AB (ref 3.87–5.11)
RDW: 18.6 % — AB (ref 11.5–15.5)
WBC: 6.1 10*3/uL (ref 4.0–10.5)

## 2014-07-30 LAB — VANCOMYCIN, TROUGH: VANCOMYCIN TR: 54.3 ug/mL — AB (ref 10.0–20.0)

## 2014-07-30 LAB — BASIC METABOLIC PANEL
ANION GAP: 9 (ref 5–15)
BUN: 13 mg/dL (ref 6–23)
CALCIUM: 7.7 mg/dL — AB (ref 8.4–10.5)
CO2: 28 mmol/L (ref 19–32)
CREATININE: 1.04 mg/dL (ref 0.50–1.10)
Chloride: 105 mmol/L (ref 96–112)
GFR, EST AFRICAN AMERICAN: 70 mL/min — AB (ref >90)
GFR, EST NON AFRICAN AMERICAN: 61 mL/min — AB (ref >90)
Glucose, Bld: 73 mg/dL (ref 70–99)
POTASSIUM: 3.2 mmol/L — AB (ref 3.5–5.1)
SODIUM: 142 mmol/L (ref 135–145)

## 2014-07-30 LAB — MAGNESIUM: Magnesium: 1.3 mg/dL — ABNORMAL LOW (ref 1.5–2.5)

## 2014-07-30 LAB — PHOSPHORUS: Phosphorus: 1.5 mg/dL — ABNORMAL LOW (ref 2.3–4.6)

## 2014-07-30 LAB — BRAIN NATRIURETIC PEPTIDE: B Natriuretic Peptide: 14.3 pg/mL (ref 0.0–100.0)

## 2014-07-30 MED ORDER — POTASSIUM PHOSPHATES 15 MMOLE/5ML IV SOLN
24.0000 mmol | Freq: Once | INTRAVENOUS | Status: AC
  Administered 2014-07-30: 24 mmol via INTRAVENOUS
  Filled 2014-07-30: qty 8

## 2014-07-30 MED ORDER — ENOXAPARIN SODIUM 100 MG/ML ~~LOC~~ SOLN
95.0000 mg | Freq: Two times a day (BID) | SUBCUTANEOUS | Status: DC
  Administered 2014-07-31 – 2014-08-02 (×5): 95 mg via SUBCUTANEOUS
  Filled 2014-07-30 (×7): qty 1

## 2014-07-30 MED ORDER — MAGNESIUM SULFATE 4 GM/100ML IV SOLN
4.0000 g | Freq: Once | INTRAVENOUS | Status: AC
  Administered 2014-07-30: 4 g via INTRAVENOUS
  Filled 2014-07-30: qty 100

## 2014-07-30 MED ORDER — POTASSIUM CHLORIDE 10 MEQ/50ML IV SOLN
10.0000 meq | INTRAVENOUS | Status: AC
Start: 2014-07-30 — End: 2014-07-30
  Administered 2014-07-30 (×4): 10 meq via INTRAVENOUS
  Filled 2014-07-30 (×4): qty 50

## 2014-07-30 NOTE — Progress Notes (Signed)
[facility] LPELINK ADULT ICU REPLACEMENT PROTOCOL FOR AM LAB REPLACEMENT ONLY  The patient does apply for the [facility] Electrolyte Replacment Protocol based on the criteria listed below:   1. Is GFR >/= 40 ml/min? Yes.    Patient's GFR today is 70 2. Is urine output >/= 0.5 ml/kg/hr for the last 6 hours? Yes.   Patient's UOP is 1.9 ml/kg/hr 3. Is BUN < 60 mg/dL? Yes.    Patient's BUN today is 13 4. Abnormal electrolyte(s): K 3.2 5. Ordered repletion with: per protocol 6. If a panic level lab has been reported, has the CCM MD in charge been notified? No..   Physician:    Markus DaftWHELAN, Glorianne Proctor A 07/30/2014 6:06 AM
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
## 2014-07-30 NOTE — Progress Notes (Addendum)
ANTIBIOTIC CONSULT NOTE - FOLLOW UP  Pharmacy Consult for Vancomycin Indication: sepsis, UTI, ?HCAP  Allergies  Allergen Reactions  . Iodine-131 Hives  . Vancomycin Nausea And Vomiting    Note from [facility]: Tolerated vancomycin run in over 2 hours  . Zosyn [Piperacillin Sod-Tazobactam So] Swelling  . Iohexol Rash    Patient Measurements: Height:  (147.3 cm) Weight: 214 lb (97.07 kg) IBW/kg (Calculated) : 40.9 Adjusted Body Weight: n/a  Vital Signs: Temp: 98.9 F (37.2 C) (02/07 1927) Temp Source: Oral (02/07 1927) BP: 123/78 mmHg (02/07 1800) Pulse Rate: 108 (02/07 1800) Intake/Output from previous day: 02/06 0701 - 02/07 0700 In: 923.3 [I.V.:523.3; IV Piggyback:400] Out: 4205 [Urine:4205] Intake/Output from this shift:    Labs:  Recent Labs  07/28/14 0305 07/29/14 0430 07/29/14 1858 07/30/14 0430  WBC 7.2 6.2  --  6.1  HGB 8.5* 8.5*  --  8.9*  PLT 427* 425*  --  432*  CREATININE 0.73 0.85 0.98 1.04   Estimated Creatinine Clearance: 63.3 mL/min (by C-G formula based on Cr of 1.04).  Recent Labs  07/30/14 1730  VANCOTROUGH >60.0*     Microbiology: Recent Results (from the past 720 hour(s))  Blood Culture (routine x 2)     Status: None   Collection Time: 07/25/14  1:00 AM  Result Value Ref Range Status   Specimen Description BLOOD RIGHT FEMORAL ARTERY  Final   Special Requests BOTTLES DRAWN AEROBIC AND ANAEROBIC 5CC EA  Final   Culture   Final    STAPHYLOCOCCUS AUREUS Note: RIFAMPIN AND GENTAMICIN SHOULD NOT BE USED AS SINGLE DRUGS FOR TREATMENT OF STAPH INFECTIONS. Note: Gram Stain Report Called to,Read Back By and Verified With: CELENIE SOSA 07/26/14 5:30AM THOMI/ORTCH Performed at [facility]    Report Status 07/28/2014 FINAL  Final   Organism ID, Bacteria STAPHYLOCOCCUS AUREUS  Final      Susceptibility   Staphylococcus aureus - MIC*    CLINDAMYCIN <=0.25 SENSITIVE Sensitive     ERYTHROMYCIN <=0.25 SENSITIVE Sensitive    GENTAMICIN <=0.5 SENSITIVE Sensitive     LEVOFLOXACIN 0.25 SENSITIVE Sensitive     OXACILLIN 0.5 SENSITIVE Sensitive     PENICILLIN >=0.5 RESISTANT Resistant     RIFAMPIN <=0.5 SENSITIVE Sensitive     TRIMETH/SULFA <=10 SENSITIVE Sensitive     VANCOMYCIN 1 SENSITIVE Sensitive     TETRACYCLINE <=1 SENSITIVE Sensitive     MOXIFLOXACIN <=0.25 SENSITIVE Sensitive     * STAPHYLOCOCCUS AUREUS  Blood Culture (routine x 2)     Status: None (Preliminary result)   Collection Time: 07/25/14  1:00 AM  Result Value Ref Range Status   Specimen Description BLOOD RIGHT ARM  Final   Special Requests BOTTLES DRAWN AEROBIC ONLY 2CC  Final   Culture   Final           BLOOD CULTURE RECEIVED NO GROWTH TO DATE CULTURE WILL BE HELD FOR 5 DAYS BEFORE ISSUING A FINAL NEGATIVE REPORT Note: Culture results may be compromised due to an inadequate volume of blood received in culture bottles. Performed at [facility]    Report Status PENDING  Incomplete  Urine culture     Status: None   Collection Time: 07/25/14  1:27 AM  Result Value Ref Range Status   Specimen Description URINE, CATHETERIZED  Final   Special Requests NONE  Final   Colony Count   Final    80,000 COLONIES/ML Performed at [facility]  Final    PROTEUS MIRABILIS [facility]formed at [facility]    Report Status 07/27/2014 FINAL  Final   Organism ID, Bacteria PROTEUS MIRABILIS  Final      Susceptibility   Proteus mirabilis - MIC*    AMPICILLIN <=2 SENSITIVE Sensitive     CEFAZOLIN 8 SENSITIVE Sensitive     CEFTRIAXONE <=1 SENSITIVE Sensitive     CIPROFLOXACIN >=4 RESISTANT Resistant     GENTAMICIN 8 INTERMEDIATE Intermediate     LEVOFLOXACIN >=8 RESISTANT Resistant     NITROFURANTOIN 128 RESISTANT Resistant     TOBRAMYCIN 8 INTERMEDIATE Intermediate     TRIMETH/SULFA >=320 RESISTANT Resistant     PIP/TAZO <=4 SENSITIVE Sensitive     * PROTEUS MIRABILIS  Respiratory virus panel (routine influenza)      Status: None   Collection Time: 07/25/14 12:13 PM  Result Value Ref Range Status   Respiratory Syncytial Virus A Negative Negative Final   Respiratory Syncytial Virus B Negative Negative Final   Influenza A Negative Negative Final   Influenza B Negative Negative Final   Parainfluenza 1 Negative Negative Final   Parainfluenza 2 Negative Negative Final   Parainfluenza 3 Negative Negative Final   Metapneumovirus Negative Negative Final   Rhinovirus Negative Negative Final   Adenovirus Negative Negative Final    Comment: (NOTE) Performed At: [facility] [address] [place], [place] [id number] Mila HomerHancock William F MD [phone]   MRSA PCR Screening     Status: None   Collection Time: 07/25/14  2:59 PM  Result Value Ref Range Status   MRSA by PCR NEGATIVE NEGATIVE Final    Comment:        The GeneXpert MRSA Assay (FDA approved for NASAL specimens only), is one component of a comprehensive MRSA colonization surveillance program. It is not intended to diagnose MRSA infection nor to guide or monitor treatment for MRSA infections.   Culture, blood (routine x 2)     Status: None (Preliminary result)   Collection Time: 07/28/14  3:40 PM  Result Value Ref Range Status   Specimen Description BLOOD NECK LEFT  Final   Special Requests BOTTLES DRAWN AEROBIC AND ANAEROBIC 10CC  Final   Culture   Final           BLOOD CULTURE RECEIVED NO GROWTH TO DATE CULTURE WILL BE HELD FOR 5 DAYS BEFORE ISSUING A FINAL NEGATIVE REPORT Performed at [facility]    Report Status PENDING  Incomplete  Culture, blood (routine x 2)     Status: None (Preliminary result)   Collection Time: 07/28/14  7:26 PM  Result Value Ref Range Status   Specimen Description BLOOD RIGHT HAND  Final   Special Requests BOTTLES DRAWN AEROBIC ONLY 3CC  Final   Culture   Final           BLOOD CULTURE RECEIVED NO GROWTH TO DATE CULTURE WILL BE HELD FOR 5 DAYS BEFORE ISSUING A FINAL NEGATIVE REPORT Performed  at [facility]    Report Status PENDING  Incomplete  Culture, respiratory (NON-Expectorated)     Status: None (Preliminary result)   Collection Time: 07/28/14  9:15 PM  Result Value Ref Range Status   Specimen Description TRACHEAL ASPIRATE  Final   Special Requests NONE  Final   Gram Stain PENDING  Incomplete   Culture   Final    NO GROWTH 1 DAY Performed at [facility]    Report Status PENDING  Incomplete  Anti-infectives    Start     Dose/Rate Route Frequency Ordered Stop   07/28/14 2200  levofloxacin (LEVAQUIN) IVPB 750 mg     750 mg100 mL/hr over 90 Minutes Intravenous Every 24 hours 07/28/14 2140     07/26/14 0600  vancomycin (VANCOCIN) IVPB 750 mg/150 ml premix  Status:  Discontinued     750 mg75 mL/hr over 120 Minutes Intravenous Every 12 hours 07/26/14 0545 07/30/14 2016   07/26/14 0200  levofloxacin (LEVAQUIN) IVPB 750 mg  Status:  Discontinued     750 mg100 mL/hr over 90 Minutes Intravenous Every 24 hours 07/25/14 0704 07/27/14 1152   07/25/14 0800  aztreonam (AZACTAM) 2 g in dextrose 5 % 50 mL IVPB  Status:  Discontinued     2 g100 mL/hr over 30 Minutes Intravenous Every 8 hours 07/25/14 0705 07/26/14 1723   07/25/14 0000  levofloxacin (LEVAQUIN) IVPB 750 mg     750 mg100 mL/hr over 90 Minutes Intravenous  Once 12-Aug-2014 2349 07/25/14 0325   07/25/14 0000  aztreonam (AZACTAM) 2 g in dextrose 5 % 50 mL IVPB     2 g100 mL/hr over 30 Minutes Intravenous  Once August 12, 2014 2349 07/25/14 0146   07/25/14 0000  vancomycin (VANCOCIN) IVPB 1000 mg/200 mL premix     1,000 mg200 mL/hr over 60 Minutes Intravenous  Once 08-12-2014 2349 07/25/14 0435   07/25/14 0000  levofloxacin (LEVAQUIN) IVPB 750 mg  Status:  Discontinued     750 mg100 mL/hr over 90 Minutes Intravenous  Once 2014-08-12 2352 2014/08/12 2354   07/25/14 0000  aztreonam (AZACTAM) 2 g in dextrose 5 % 50 mL IVPB  Status:  Discontinued     2 g100 mL/hr over 30 Minutes Intravenous  Once 2014-08-12 2352 08/12/14 2354    07/25/14 0000  vancomycin (VANCOCIN) IVPB 1000 mg/200 mL premix  Status:  Discontinued     1,000 mg200 mL/hr over 60 Minutes Intravenous  Once 08/12/14 2352 08-12-2014 2354      Assessment: 54 yo female on IV vancomycin 750 mg q 12 hrs for sepsis/UTI/HCAP.  Last dose given this AM at 0536.  Vancomycin trough level reported as > 60.  Result unexpected given Scr 1.04, CrCl ~ 63 ml/min.  Not sure exactly where lab was drawn from.  Possibly from central line where vancomycin was hung earlier today?  Goal of Therapy:  Vancomycin trough level 15-20 mcg/ml  Plan:  1. Asking phlebotomy to redraw vancomycin trough peripherally. 2. Will f/u result, hold vancomycin for now.  Tad Moore, BCPS  Clinical Pharmacist Pager [phone]  07/30/2014 8:27 PM   Addendum: Recheck vancomycin trough 54.3  Will hold vancomycin for now, recheck level Tuesday AM  Geannie Risen, PharmD, BCPS 07/30/2014 11:10 PM

## 2014-07-31 ENCOUNTER — Inpatient Hospital Stay (HOSPITAL_COMMUNITY): Payer: BLUE CROSS/BLUE SHIELD

## 2014-07-31 LAB — BASIC METABOLIC PANEL
Anion gap: 8 (ref 5–15)
BUN: 16 mg/dL (ref 6–23)
CO2: 30 mmol/L (ref 19–32)
CREATININE: 1.15 mg/dL — AB (ref 0.50–1.10)
Calcium: 7.5 mg/dL — ABNORMAL LOW (ref 8.4–10.5)
Chloride: 99 mmol/L (ref 96–112)
GFR calc Af Amer: 62 mL/min — ABNORMAL LOW (ref >90)
GFR, EST NON AFRICAN AMERICAN: 54 mL/min — AB (ref >90)
Glucose, Bld: 81 mg/dL (ref 70–99)
Potassium: 4.1 mmol/L (ref 3.5–5.1)
SODIUM: 137 mmol/L (ref 135–145)

## 2014-07-31 LAB — CBC WITH DIFFERENTIAL/PLATELET
BASOS ABS: 0 10*3/uL (ref 0.0–0.1)
Basophils Relative: 0 % (ref 0–1)
EOS ABS: 0.2 10*3/uL (ref 0.0–0.7)
EOS PCT: 4 % (ref 0–5)
HEMATOCRIT: 29.2 % — AB (ref 36.0–46.0)
HEMOGLOBIN: 9 g/dL — AB (ref 12.0–15.0)
Lymphocytes Relative: 21 % (ref 12–46)
Lymphs Abs: 1.3 10*3/uL (ref 0.7–4.0)
MCH: 24.5 pg — ABNORMAL LOW (ref 26.0–34.0)
MCHC: 30.8 g/dL (ref 30.0–36.0)
MCV: 79.3 fL (ref 78.0–100.0)
Monocytes Absolute: 0.7 10*3/uL (ref 0.1–1.0)
Monocytes Relative: 11 % (ref 3–12)
NEUTROS PCT: 64 % (ref 43–77)
Neutro Abs: 4 10*3/uL (ref 1.7–7.7)
Platelets: 404 10*3/uL — ABNORMAL HIGH (ref 150–400)
RBC: 3.68 MIL/uL — ABNORMAL LOW (ref 3.87–5.11)
RDW: 18.9 % — ABNORMAL HIGH (ref 11.5–15.5)
WBC: 6.2 10*3/uL (ref 4.0–10.5)

## 2014-07-31 LAB — CK TOTAL AND CKMB (NOT AT ARMC)
CK TOTAL: 76 U/L (ref 7–177)
CK, MB: 1.8 ng/mL (ref 0.3–4.0)
RELATIVE INDEX: INVALID (ref 0.0–2.5)

## 2014-07-31 LAB — LACTIC ACID, PLASMA: Lactic Acid, Venous: 1.1 mmol/L (ref 0.5–2.0)

## 2014-07-31 LAB — GLUCOSE, CAPILLARY
GLUCOSE-CAPILLARY: 79 mg/dL (ref 70–99)
Glucose-Capillary: 112 mg/dL — ABNORMAL HIGH (ref 70–99)

## 2014-07-31 LAB — CULTURE, BLOOD (ROUTINE X 2): Culture: NO GROWTH

## 2014-07-31 LAB — CULTURE, RESPIRATORY: Gram Stain: NONE SEEN

## 2014-07-31 LAB — MAGNESIUM: MAGNESIUM: 2.5 mg/dL (ref 1.5–2.5)

## 2014-07-31 LAB — PROTIME-INR
INR: 1.21 (ref 0.00–1.49)
Prothrombin Time: 15.4 seconds — ABNORMAL HIGH (ref 11.6–15.2)

## 2014-07-31 LAB — CULTURE, RESPIRATORY W GRAM STAIN: Culture: NO GROWTH

## 2014-07-31 LAB — PHOSPHORUS: PHOSPHORUS: 4.4 mg/dL (ref 2.3–4.6)

## 2014-07-31 MED ORDER — PRO-STAT SUGAR FREE PO LIQD
30.0000 mL | Freq: Two times a day (BID) | ORAL | Status: AC
  Administered 2014-07-31 – 2014-08-01 (×2): 30 mL
  Filled 2014-07-31 (×2): qty 30

## 2014-07-31 MED ORDER — PRO-STAT SUGAR FREE PO LIQD: 30.0000 mL | Freq: Two times a day (BID) | ORAL | Status: DC

## 2014-07-31 MED ORDER — CEFAZOLIN SODIUM-DEXTROSE 2-3 GM-% IV SOLR
2.0000 g | Freq: Three times a day (TID) | INTRAVENOUS | Status: DC
  Administered 2014-07-31 – 2014-08-03 (×9): 2 g via INTRAVENOUS
  Filled 2014-07-31 (×12): qty 50

## 2014-07-31 MED ORDER — VITAL HIGH PROTEIN PO LIQD
1000.0000 mL | ORAL | Status: DC
  Administered 2014-08-01: 25 mL
  Administered 2014-08-01: 16:00:00
  Administered 2014-08-01: 1000 mL
  Administered 2014-08-01: 25 mL
  Administered 2014-08-01: 17:00:00
  Administered 2014-08-01 – 2014-08-03 (×4): 1000 mL
  Administered 2014-08-03: 10:00:00
  Administered 2014-08-04: 1000 mL
  Administered 2014-08-05 (×9)
  Administered 2014-08-05: 1000 mL
  Administered 2014-08-05: 16:00:00
  Administered 2014-08-06: 1000 mL
  Administered 2014-08-07: 11:00:00
  Administered 2014-08-07: 1000 mL
  Administered 2014-08-07 (×4)
  Administered 2014-08-07 – 2014-08-08 (×2): 1000 mL
  Filled 2014-07-31 (×13): qty 1000

## 2014-07-31 MED ORDER — FENTANYL CITRATE 0.05 MG/ML IJ SOLN
50.0000 ug | Freq: Once | INTRAMUSCULAR | Status: DC
Start: 2014-07-31 — End: 2014-08-09

## 2014-07-31 MED ORDER — SODIUM CHLORIDE 0.9 % IV BOLUS (SEPSIS)
500.0000 mL | Freq: Once | INTRAVENOUS | Status: AC
  Administered 2014-07-31: 500 mL via INTRAVENOUS

## 2014-07-31 MED ORDER — FENTANYL BOLUS VIA INFUSION
50.0000 ug | INTRAVENOUS | Status: DC | PRN
  Administered 2014-08-04 – 2014-08-08 (×3): 50 ug via INTRAVENOUS
  Filled 2014-07-31: qty 50

## 2014-07-31 MED ORDER — FENTANYL CITRATE 0.05 MG/ML IJ SOLN
25.0000 ug/h | INTRAMUSCULAR | Status: DC
  Administered 2014-07-31: 50 ug/h via INTRAVENOUS
  Administered 2014-08-01: 125 ug/h via INTRAVENOUS
  Administered 2014-08-01: 150 ug/h via INTRAVENOUS
  Administered 2014-08-02 (×2): 225 ug/h via INTRAVENOUS
  Administered 2014-08-03: 250 ug/h via INTRAVENOUS
  Administered 2014-08-03: 200 ug/h via INTRAVENOUS
  Administered 2014-08-03: 300 ug/h via INTRAVENOUS
  Administered 2014-08-04: 150 ug/h via INTRAVENOUS
  Administered 2014-08-05: 120 ug/h via INTRAVENOUS
  Administered 2014-08-05: 150 ug/h via INTRAVENOUS
  Administered 2014-08-06: 100 ug/h via INTRAVENOUS
  Administered 2014-08-07: 120 ug/h via INTRAVENOUS
  Administered 2014-08-08: 150 ug/h via INTRAVENOUS
  Administered 2014-08-08: 175 ug/h via INTRAVENOUS
  Administered 2014-08-09: 200 ug/h via INTRAVENOUS
  Filled 2014-07-31 (×15): qty 50

## 2014-07-31 NOTE — Progress Notes (Signed)
eLink Physician-Brief Progress Note Patient Name: Rubie MaidBabbie A Burrows DOB: [birth year] MRN: [id number]   Date of Service  07/31/2014  HPI/Events of Note  Patient self-extubated.  Now on NRB with sats of 100% but RR in the high 20s to 40s.  Patient is not following commands.  HR in the 120s and is hypertensive.  eICU Interventions  Plan: To be evaluated at the bedside for possible re-intubation      Intervention Category Intermediate Interventions: Respiratory distress - evaluation and management  Omarion Minnehan 07/31/2014, 11:41 PM
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
## 2014-07-31 NOTE — Progress Notes (Signed)
UR Completed.  [phone]  

## 2014-07-31 NOTE — Progress Notes (Signed)
ET tube retracted 1.5cm per MD order. ET tube now secure at 21 cm. Pt tolerated well, RT will continue to monitor.
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
## 2014-07-31 NOTE — Progress Notes (Addendum)
[facility]    Subjective: Intubated restrained  Antibiotics:  Anti-infectives    Start     Dose/Rate Route Frequency Ordered Stop   07/28/14 2200  levofloxacin (LEVAQUIN) IVPB 750 mg     750 mg100 mL/hr over 90 Minutes Intravenous Every 24 hours 07/28/14 2140     07/26/14 0600  vancomycin (VANCOCIN) IVPB 750 mg/150 ml premix  Status:  Discontinued     750 mg75 mL/hr over 120 Minutes Intravenous Every 12 hours 07/26/14 0545 07/30/14 2016   07/26/14 0200  levofloxacin (LEVAQUIN) IVPB 750 mg  Status:  Discontinued     750 mg100 mL/hr over 90 Minutes Intravenous Every 24 hours 07/25/14 0704 07/27/14 1152   07/25/14 0800  aztreonam (AZACTAM) 2 g in dextrose 5 % 50 mL IVPB  Status:  Discontinued     2 g100 mL/hr over 30 Minutes Intravenous Every 8 hours 07/25/14 0705 07/26/14 1723   07/25/14 0000  levofloxacin (LEVAQUIN) IVPB 750 mg     750 mg100 mL/hr over 90 Minutes Intravenous  Once 08/20/2014 2349 07/25/14 0325   07/25/14 0000  aztreonam (AZACTAM) 2 g in dextrose 5 % 50 mL IVPB     2 g100 mL/hr over 30 Minutes Intravenous  Once 08/19/2014 2349 07/25/14 0146   07/25/14 0000  vancomycin (VANCOCIN) IVPB 1000 mg/200 mL premix     1,000 mg200 mL/hr over 60 Minutes Intravenous  Once 07/30/2014 2349 07/25/14 0435   07/25/14 0000  levofloxacin (LEVAQUIN) IVPB 750 mg  Status:  Discontinued     750 mg100 mL/hr over 90 Minutes Intravenous  Once 08/03/2014 2352 08/10/2014 2354   07/25/14 0000  aztreonam (AZACTAM) 2 g in dextrose 5 % 50 mL IVPB  Status:  Discontinued     2 g100 mL/hr over 30 Minutes Intravenous  Once 08/04/2014 2352 08/18/2014 2354   07/25/14 0000  vancomycin (VANCOCIN) IVPB 1000 mg/200 mL premix  Status:  Discontinued     1,000 mg200 mL/hr over 60 Minutes Intravenous  Once 08/03/2014 2352 08/13/2014 2354      Medications: Scheduled Meds: . antiseptic oral rinse  7 mL Mouth Rinse QID  . chlorhexidine  15 mL Mouth Rinse BID  . enoxaparin (LOVENOX) injection  95 mg  Subcutaneous Q12H  . famotidine (PEPCID) IV  20 mg Intravenous Q12H  . fentaNYL  50 mcg Intravenous Once  . furosemide  40 mg Intravenous Q12H  . haloperidol lactate  4 mg Intravenous 3 times per day  . levofloxacin (LEVAQUIN) IV  750 mg Intravenous Q24H  . sodium chloride  3 mL Intravenous Q12H   Continuous Infusions: . sodium chloride 10 mL/hr at 07/31/14 0800  . fentaNYL infusion INTRAVENOUS 50 mcg/hr (07/31/14 0800)   PRN Meds:.Place/Maintain arterial line **AND** sodium chloride, fentaNYL, fentaNYL, midazolam, MUSCLE RUB, ondansetron **OR** ondansetron (ZOFRAN) IV    Objective: Weight change: -3 lb (-1.361 kg)  Intake/Output Summary (Last 24 hours) at 07/31/14 1253 Last data filed at 07/31/14 0800  Gross per 24 hour  Intake 1064.87 ml  Output   2055 ml  Net -990.13 ml   Blood pressure 102/65, pulse 102, temperature 98 F (36.7 C), temperature source Oral, resp. rate 16, height  (1.473 m), weight 211 lb (95.709 kg), SpO2 99 %. Temp:  [98 F (36.7 C)-99.4 F (37.4 C)] 98 F (36.7 C) (02/08 1136) Pulse Rate:  [102-120] 102 (02/08 0800) Resp:  [16-17] 16 (02/08 0800) BP: (68-123)/(39-78) 102/65 mmHg (02/08 0800) SpO2:  [95 %-100 %]  99 % (02/08 0800) FiO2 (%):  [40 %] 40 % (02/08 1247) Weight:  [211 lb (95.709 kg)] 211 lb (95.709 kg) (02/08 0400)  Physical Exam: General: Intubated restrained  Cardiovascular regular rate, no murmurs Pulmonary: Relatively clear anteriorly  Abdomen obese  Skin patient has left IJ in place CBC: CBC Latest Ref Rng 07/31/2014 07/30/2014 07/29/2014  WBC 4.0 - 10.5 K/uL 6.2 6.1 6.2  Hemoglobin 12.0 - 15.0 g/dL 9.0(L) 8.9(L) 8.5(L)  Hematocrit 36.0 - 46.0 % 29.2(L) 28.8(L) 28.6(L)  Platelets 150 - 400 K/uL 404(H) 432(H) 425(H)      BMET  Recent Labs  07/30/14 0430 07/31/14 0430  NA 142 137  K 3.2* 4.1  CL 105 99  CO2 28 30  GLUCOSE 73 81  BUN 13 16  CREATININE 1.04 1.15*  CALCIUM 7.7* 7.5*     Liver Panel  No  results for input(s): PROT, ALBUMIN, AST, ALT, ALKPHOS, BILITOT, BILIDIR, IBILI in the last 72 hours.     Sedimentation Rate No results for input(s): ESRSEDRATE in the last 72 hours. C-Reactive Protein No results for input(s): CRP in the last 72 hours.  Micro Results: Recent Results (from the past 720 hour(s))  Blood Culture (routine x 2)     Status: None   Collection Time: 07/25/14  1:00 AM  Result Value Ref Range Status   Specimen Description BLOOD RIGHT FEMORAL ARTERY  Final   Special Requests BOTTLES DRAWN AEROBIC AND ANAEROBIC 5CC EA  Final   Culture   Final    STAPHYLOCOCCUS AUREUS Note: RIFAMPIN AND GENTAMICIN SHOULD NOT BE USED AS SINGLE DRUGS FOR TREATMENT OF STAPH INFECTIONS. Note: Gram Stain Report Called to,Read Back By and Verified With: CELENIE SOSA 07/26/14 5:30AM THOMI/ORTCH Performed at [facility]    Report Status 07/28/2014 FINAL  Final   Organism ID, Bacteria STAPHYLOCOCCUS AUREUS  Final      Susceptibility   Staphylococcus aureus - MIC*    CLINDAMYCIN <=0.25 SENSITIVE Sensitive     ERYTHROMYCIN <=0.25 SENSITIVE Sensitive     GENTAMICIN <=0.5 SENSITIVE Sensitive     LEVOFLOXACIN 0.25 SENSITIVE Sensitive     OXACILLIN 0.5 SENSITIVE Sensitive     PENICILLIN >=0.5 RESISTANT Resistant     RIFAMPIN <=0.5 SENSITIVE Sensitive     TRIMETH/SULFA <=10 SENSITIVE Sensitive     VANCOMYCIN 1 SENSITIVE Sensitive     TETRACYCLINE <=1 SENSITIVE Sensitive     MOXIFLOXACIN <=0.25 SENSITIVE Sensitive     * STAPHYLOCOCCUS AUREUS  Blood Culture (routine x 2)     Status: None   Collection Time: 07/25/14  1:00 AM  Result Value Ref Range Status   Specimen Description BLOOD RIGHT ARM  Final   Special Requests BOTTLES DRAWN AEROBIC ONLY 2CC  Final   Culture   Final    NO GROWTH 5 DAYS Note: Culture results may be compromised due to an inadequate volume of blood received in culture bottles. Performed at [facility]    Report Status 07/31/2014 FINAL  Final    Urine culture     Status: None   Collection Time: 07/25/14  1:27 AM  Result Value Ref Range Status   Specimen Description URINE, CATHETERIZED  Final   Special Requests NONE  Final   Colony Count   Final    80,000 COLONIES/ML Performed at [facility]    Culture   Final    PROTEUS MIRABILIS Performed at [facility]    Report Status 07/27/2014 FINAL  Final  Organism ID, Bacteria PROTEUS MIRABILIS  Final      Susceptibility   Proteus mirabilis - MIC*    AMPICILLIN <=2 SENSITIVE Sensitive     CEFAZOLIN 8 SENSITIVE Sensitive     CEFTRIAXONE <=1 SENSITIVE Sensitive     CIPROFLOXACIN >=4 RESISTANT Resistant     GENTAMICIN 8 INTERMEDIATE Intermediate     LEVOFLOXACIN >=8 RESISTANT Resistant     NITROFURANTOIN 128 RESISTANT Resistant     TOBRAMYCIN 8 INTERMEDIATE Intermediate     TRIMETH/SULFA >=320 RESISTANT Resistant     PIP/TAZO <=4 SENSITIVE Sensitive     * PROTEUS MIRABILIS  Respiratory virus panel (routine influenza)     Status: None   Collection Time: 07/25/14 12:13 PM  Result Value Ref Range Status   Respiratory Syncytial Virus A Negative Negative Final   Respiratory Syncytial Virus B Negative Negative Final   Influenza A Negative Negative Final   Influenza B Negative Negative Final   Parainfluenza 1 Negative Negative Final   Parainfluenza 2 Negative Negative Final   Parainfluenza 3 Negative Negative Final   Metapneumovirus Negative Negative Final   Rhinovirus Negative Negative Final   Adenovirus Negative Negative Final    Comment: (NOTE) Performed At: [facility] [address] [place], [place] [id number] Mila Homer MD WU:[id number]   MRSA PCR Screening     Status: None   Collection Time: 07/25/14  2:59 PM  Result Value Ref Range Status   MRSA by PCR NEGATIVE NEGATIVE Final    Comment:        The GeneXpert MRSA Assay (FDA approved for NASAL specimens only), is one component of a comprehensive MRSA colonization surveillance  program. It is not intended to diagnose MRSA infection nor to guide or monitor treatment for MRSA infections.   Culture, blood (routine x 2)     Status: None (Preliminary result)   Collection Time: 07/28/14  3:40 PM  Result Value Ref Range Status   Specimen Description BLOOD NECK LEFT  Final   Special Requests BOTTLES DRAWN AEROBIC AND ANAEROBIC 10CC  Final   Culture   Final           BLOOD CULTURE RECEIVED NO GROWTH TO DATE CULTURE WILL BE HELD FOR 5 DAYS BEFORE ISSUING A FINAL NEGATIVE REPORT Performed at [facility]    Report Status PENDING  Incomplete  Culture, blood (routine x 2)     Status: None (Preliminary result)   Collection Time: 07/28/14  7:26 PM  Result Value Ref Range Status   Specimen Description BLOOD RIGHT HAND  Final   Special Requests BOTTLES DRAWN AEROBIC ONLY 3CC  Final   Culture   Final           BLOOD CULTURE RECEIVED NO GROWTH TO DATE CULTURE WILL BE HELD FOR 5 DAYS BEFORE ISSUING A FINAL NEGATIVE REPORT Performed at [facility]    Report Status PENDING  Incomplete  Culture, respiratory (NON-Expectorated)     Status: None   Collection Time: 07/28/14  9:15 PM  Result Value Ref Range Status   Specimen Description TRACHEAL ASPIRATE  Final   Special Requests NONE  Final   Gram Stain   Final    NO WBC SEEN NO SQUAMOUS EPITHELIAL CELLS SEEN NO ORGANISMS SEEN Performed at [facility]    Culture   Final    NO GROWTH 2 DAYS Performed at [facility]    Report Status 07/31/2014 FINAL  Final    Studies/Results: Dg Chest [place]  1 View  07/31/2014   CLINICAL DATA:  Acute respiratory failure, sepsis  EXAM: PORTABLE CHEST - 1 VIEW  COMPARISON:  Portable chest x-ray of July 30, 2014  FINDINGS: The lungs are slightly better inflated today. Increased retrocardiac density persists. The left hemidiaphragm is obscured. The cardiac silhouette is top-normal in size. The pulmonary vascularity is not clearly engorged.  The endotracheal  tube tip lies just under 2 cm above the crotch of the carina. The esophagogastric tube tip projects below the inferior margin of the image. The left internal jugular venous catheter tip projects over the midportion of the SVC.  IMPRESSION: Improved aeration of both lungs. There is persistent left lower lobe atelectasis or pneumonia. The endotracheal tube tip lies just under 2 cm above the crotch of the carina. Withdrawal by 1-2 cm is recommended.   Electronically Signed   By: David  Swazil[place]   On: 07/31/2014 07:31   Dg Chest Port 1 View  07/30/2014   CLINICAL DATA:  Subsequent encounter for endotracheal tube.  EXAM: PORTABLE CHEST - 1 VIEW  COMPARISON:  One day prior  FINDINGS: Mildly degraded exam due to AP portable technique and patient body habitus. Reverse apical lordotic positioning. Endotracheal tube 2.4 cm above carina. Left internal jugular line tip low SVC. Nasogastric tube difficult to visualize within the abdomen, likely looped in the stomach.  Normal heart size. moderate right hemidiaphragm elevation. No pleural effusion or pneumothorax. Low lung volumes with resultant pulmonary interstitial prominence. Right base atelectasis.  IMPRESSION: Decreased sensitivity and specificity exam due to technique related factors, as described above.  Low lung volumes, resultant pulmonary interstitial prominence. Cannot exclude mild pulmonary venous congestion.   Electronically Signed   By: Jeronimo GreavesKyle  Talbot M.D.   On: 07/30/2014 09:26      Assessment/Plan:  Active Problems:   Sepsis   Sepsis associated hypotension   Sepsis due to urinary tract infection   Essential hypertension   Moderate malnutrition   Anemia of chronic disease   Protein-calorie malnutrition, severe   Staphylococcus aureus bacteremia with sepsis   Discitis of thoracic region   Acute respiratory failure   Acute encephalopathy   Severe sepsis   Bacteremia due to Staphylococcus aureus   Acute on chronic respiratory failure with hypoxia    Midline thoracic back pain   Sacral decubitus ulcer   Chronic diastolic CHF (congestive heart failure)   Normocytic anemia   Chronic indwelling Foley catheter   Paraplegia   S/P BKA (below knee amputation) bilateral   Moderate protein-calorie malnutrition   Acute respiratory failure with hypoxia   Encephalopathy acute    [name] is a 54 y.o. female with  . female with Paralysis, admitted with sepsis found to have methicillin sensitive Staphylococcus aureus bacteremia.  #1    [place] [facility] Staphylococcus aureus bacteremia   Staphylococcus aureus bacteremia (SAB) is associated with a high rate of complications and mortality. Specific aspects of clinical management are critical to optimizing the outcome of patients with SAB. Therefore, the [facility]) has initiated an intervention aimed at improving the management of SAB at [facility]. To do so, Infectious Diseases physicians are providing an evidence-based consult for the management of all patients with SAB.     Yes No Comments  Perform follow-up blood cultures (even if the patient is afebrile) to ensure clearance of bacteremia [X]  [ ]   blood cultures being drawn today with difficulty   Remove vascular catheter and obtain follow-up  blood cultures after the removal of the catheter [ ]  [ ]  She had IJ placed before I would have preferred, may want to consider DC it certainly if her cultures from the fifth are positive he will need to come out   Perform echocardiography to evaluate for endocarditis (transthoracic ECHO is 40-50% sensitive, TEE is > 90% sensitive) [ ]  [ ]  Please keep in mind, that neither test can definitively EXCLUDE endocarditis, and that should clinical suspicion remain high for endocarditis the patient should then still be treated with an "endocarditis" duration of therapy = 6 weeks ordered  TTE SHOWS  SMALL PERICARDIAL EFFUSION. I would consider ASKING CARDIOLOGY TO GET TEE WHILE SHE IS ON THE VENTILATOR  Consult electrophysiologist to evaluate implanted cardiac device (pacemaker, ICD) [ ]  [ ]    Ensure source control [ ]  [ ]  Have all abscesses been drained effectively? Have deep seeded infections (septic joints or osteomyelitis) had appropriate surgical debridement?  Not clear what source is pneumonia? Her back  Investigate for "metastatic" sites of infection [ ]  [ ]  Does the patient have ANY symptom or physical exam finding that would suggest a deeper infection (back or neck pain that may be suggestive of vertebral osteomyelitis or epidural abscess, muscle pain that could be a symptom of pyomyositis)?  Keep in mind that for deep seeded infections MRI imaging with contrast is preferred rather than other often insensitive tests such as plain x-rays, especially early in a patient's presentation.   GIVEN HER SIGNIFICANT BACK PAIN AND FINDINGS ON PLAIN FILM OF CHEST I WOULD RECOMMEND EITHER MRI T SPINE   Change antibiotic therapy to ANCEF -J Frens pointed out that she has had tract stone during this admission without difficulty  [ ]  [ ]  Beta-lactam antibiotics are preferred for MSSA due to higher cure rates.  If on Vancomycin, goal trough should be 15 - 20 mcg/mL  Estimated duration of IV antibiotic therapy: 8 weeks esp if she has diskitis [ ]  [ ]  Consult case management for probably prolonged outpatient IV antibiotic therapy   #2 CAP HCAP ?: levaquin was restarted (which she was on previously) she has had 6 days of this--no further role for this antibiotic to be continued I will dc it again     #3 Screening: check HIV and hep panel     LOS: 7 days   Acey Lav 07/31/2014, 12:53 PM

## 2014-07-31 NOTE — Progress Notes (Signed)
Yellow bite block placed due to pt continuing to bite on the ET tube, and preventing us from being able to pass suction catheter, and high PIP. RT will continue to monitor.

## 2014-08-01 ENCOUNTER — Inpatient Hospital Stay (HOSPITAL_COMMUNITY): Payer: BLUE CROSS/BLUE SHIELD

## 2014-08-01 DIAGNOSIS — J189 Pneumonia, unspecified organism: Secondary | ICD-10-CM

## 2014-08-01 LAB — PHOSPHORUS: Phosphorus: 4.4 mg/dL (ref 2.3–4.6)

## 2014-08-01 LAB — CBC WITH DIFFERENTIAL/PLATELET
BASOS PCT: 0 % (ref 0–1)
Basophils Absolute: 0 10*3/uL (ref 0.0–0.1)
Eosinophils Absolute: 0.2 10*3/uL (ref 0.0–0.7)
Eosinophils Relative: 2 % (ref 0–5)
HEMATOCRIT: 28.9 % — AB (ref 36.0–46.0)
HEMOGLOBIN: 8.9 g/dL — AB (ref 12.0–15.0)
LYMPHS ABS: 1.4 10*3/uL (ref 0.7–4.0)
Lymphocytes Relative: 13 % (ref 12–46)
MCH: 24.1 pg — ABNORMAL LOW (ref 26.0–34.0)
MCHC: 30.8 g/dL (ref 30.0–36.0)
MCV: 78.1 fL (ref 78.0–100.0)
MONO ABS: 0.8 10*3/uL (ref 0.1–1.0)
MONOS PCT: 7 % (ref 3–12)
NEUTROS PCT: 78 % — AB (ref 43–77)
Neutro Abs: 8.3 10*3/uL — ABNORMAL HIGH (ref 1.7–7.7)
Platelets: 374 10*3/uL (ref 150–400)
RBC: 3.7 MIL/uL — AB (ref 3.87–5.11)
RDW: 18.9 % — ABNORMAL HIGH (ref 11.5–15.5)
WBC: 10.7 10*3/uL — ABNORMAL HIGH (ref 4.0–10.5)

## 2014-08-01 LAB — POCT I-STAT 3, ART BLOOD GAS (G3+)
Acid-Base Excess: 1 mmol/L (ref 0.0–2.0)
Bicarbonate: 25.2 mEq/L — ABNORMAL HIGH (ref 20.0–24.0)
O2 Saturation: 89 %
Patient temperature: 98.3
TCO2: 26 mmol/L (ref 0–100)
pCO2 arterial: 36.7 mmHg (ref 35.0–45.0)
pH, Arterial: 7.444 (ref 7.350–7.450)
pO2, Arterial: 53 mmHg — ABNORMAL LOW (ref 80.0–100.0)

## 2014-08-01 LAB — GLUCOSE, CAPILLARY
GLUCOSE-CAPILLARY: 89 mg/dL (ref 70–99)
GLUCOSE-CAPILLARY: 82 mg/dL (ref 70–99)
GLUCOSE-CAPILLARY: 94 mg/dL (ref 70–99)
Glucose-Capillary: 119 mg/dL — ABNORMAL HIGH (ref 70–99)
Glucose-Capillary: 134 mg/dL — ABNORMAL HIGH (ref 70–99)

## 2014-08-01 LAB — BASIC METABOLIC PANEL
ANION GAP: 10 (ref 5–15)
BUN: 17 mg/dL (ref 6–23)
CALCIUM: 7.5 mg/dL — AB (ref 8.4–10.5)
CO2: 27 mmol/L (ref 19–32)
Chloride: 101 mmol/L (ref 96–112)
Creatinine, Ser: 1.21 mg/dL — ABNORMAL HIGH (ref 0.50–1.10)
GFR calc Af Amer: 59 mL/min — ABNORMAL LOW (ref >90)
GFR, EST NON AFRICAN AMERICAN: 51 mL/min — AB (ref >90)
Glucose, Bld: 99 mg/dL (ref 70–99)
Potassium: 3.2 mmol/L — ABNORMAL LOW (ref 3.5–5.1)
SODIUM: 138 mmol/L (ref 135–145)

## 2014-08-01 LAB — MAGNESIUM: MAGNESIUM: 2 mg/dL (ref 1.5–2.5)

## 2014-08-01 MED ORDER — POTASSIUM CHLORIDE 10 MEQ/50ML IV SOLN
10.0000 meq | INTRAVENOUS | Status: AC
  Administered 2014-08-01 (×3): 10 meq via INTRAVENOUS
  Filled 2014-08-01 (×2): qty 50

## 2014-08-01 MED ORDER — POTASSIUM CHLORIDE 20 MEQ/15ML (10%) PO SOLN
30.0000 meq | ORAL | Status: DC
  Administered 2014-08-01: 30 meq
  Filled 2014-08-01: qty 30

## 2014-08-01 MED ORDER — PHENYLEPHRINE HCL 10 MG/ML IJ SOLN
30.0000 ug/min | INTRAVENOUS | Status: DC
  Administered 2014-08-01 (×2): 30 ug/min via INTRAVENOUS
  Administered 2014-08-01: 100 ug/min via INTRAVENOUS
  Filled 2014-08-01 (×4): qty 1

## 2014-08-01 MED ORDER — PHENYLEPHRINE HCL 10 MG/ML IJ SOLN
30.0000 ug/min | INTRAMUSCULAR | Status: DC
  Administered 2014-08-01: 50 ug/min via INTRAVENOUS
  Administered 2014-08-02: 200 ug/min via INTRAVENOUS
  Administered 2014-08-02: 175 ug/min via INTRAVENOUS
  Administered 2014-08-02: 125 ug/min via INTRAVENOUS
  Administered 2014-08-02 (×2): 200 ug/min via INTRAVENOUS
  Administered 2014-08-02: 100 ug/min via INTRAVENOUS
  Administered 2014-08-03: 200 ug/min via INTRAVENOUS
  Administered 2014-08-03: 150 ug/min via INTRAVENOUS
  Administered 2014-08-03 (×4): 200 ug/min via INTRAVENOUS
  Administered 2014-08-04: 100 ug/min via INTRAVENOUS
  Administered 2014-08-04 (×2): 125 ug/min via INTRAVENOUS
  Administered 2014-08-04: 150 ug/min via INTRAVENOUS
  Administered 2014-08-05: 100 ug/min via INTRAVENOUS
  Administered 2014-08-05: 150 ug/min via INTRAVENOUS
  Filled 2014-08-01 (×22): qty 4

## 2014-08-01 MED ORDER — SODIUM CHLORIDE 0.9 % IV BOLUS (SEPSIS)
500.0000 mL | Freq: Once | INTRAVENOUS | Status: AC
  Administered 2014-08-01: 500 mL via INTRAVENOUS

## 2014-08-01 NOTE — Progress Notes (Signed)
Patient's family updated regarding patient self-extubation and subsequent re-intubation.  Called husband and sister.  Both understand and are aware of patient's current status.  Was able to communicate that patient expressed desire to not have tube.  Further conversation needs to be had with family members and patient's expressed wishes as we move forward with direction of patient care.

## 2014-08-01 NOTE — Progress Notes (Signed)
[facility] REPLACEMENT ONLY  The patient does apply [facility] Electrolyte [facility]placment Protocol based on the criteria listed below:   1. Is GFR >/= 40 ml/min? Yes.    Patient's GFR today is 59 2. Is urine output >/= 0.5 ml/kg/hr for the last 6 hours? Yes.   Patient's UOP is 0.7 ml/kg/hr 3. Is BUN < 60 mg/dL? Yes.    Patient's BUN today is 17 4. Abnormal electrolyte(s): K+3.2 5. Ordered repletion with: protocol 6. If a panic level lab has been reported, has the CCM MD in charge been notified? No..   Physician:  Wyline MoodE Deterding  [facility] Denunzio [facility] 08/01/2014 5:24 AM

## 2014-08-01 NOTE — Procedures (Signed)
Intubation Procedure Note Rubie MaidBabbie A Lariviere [id number] Aug 11, 1960  Procedure: Intubation Indications: self extubated and then resp insufficiency  Procedure Details Consent: Risks of procedure as well as the alternatives and risks of each were explained to the (patient/caregiver).  Consent for procedure obtained. and Unable to obtain consent because of emergent medical necessity. Time Out: Verified patient identification, verified procedure, site/side was marked, verified correct patient position, special equipment/implants available, medications/allergies/relevent history reviewed, required imaging and test results available.  Performed  Maximum sterile technique was used including cap, gloves, gown, hand hygiene and mask.  MAC    Evaluation Hemodynamic Status: BP stable throughout; O2 sats: stable throughout and transiently fell during during procedure Patient's Current Condition: stable Complications: No apparent complications Patient did tolerate procedure well. Chest X-ray ordered to verify placement.  CXR: pending.   Leliana Kontz 08/01/2014   GLidescope used Easy intubation  Dr. Kalman ShanMurali Willard Farquharson, M.D., [facility].P Pulmonary and Critical Care Medicine Staff Physician [facility] [name] [facility] Pager: [phone], If no answer or between  15:00h - 7:00h: call [phone]  08/01/2014 12:14 AM
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
## 2014-08-01 NOTE — Progress Notes (Signed)
Patient self-extubated at approximately 2330.  Placed patient on non-rebreather mask.  Sats 100%, but respiration rate 20's to high 30's.  Patient is stating over and over "Please, please, I want to die".  Dr. Darrick Pennaeterding and Dr. Colletta Marylandamaswami notified of patient status and patient's repeated statement. Patient can squeeze hand on command, but will not let go on command.

## 2014-08-01 NOTE — Progress Notes (Signed)
[facility]       Subjective: Intubated restrained  Antibiotics:  Anti-infectives    Start     Dose/Rate Route Frequency Ordered Stop   07/31/14 1515  ceFAZolin (ANCEF) IVPB 2 g/50 mL premix     2 g100 mL/hr over 30 Minutes Intravenous 3 times per day 07/31/14 1510     07/28/14 2200  levofloxacin (LEVAQUIN) IVPB 750 mg  Status:  Discontinued     750 mg100 mL/hr over 90 Minutes Intravenous Every 24 hours 07/28/14 2140 07/31/14 1433   07/26/14 0600  vancomycin (VANCOCIN) IVPB 750 mg/150 ml premix  Status:  Discontinued     750 mg75 mL/hr over 120 Minutes Intravenous Every 12 hours 07/26/14 0545 07/30/14 2016   07/26/14 0200  levofloxacin (LEVAQUIN) IVPB 750 mg  Status:  Discontinued     750 mg100 mL/hr over 90 Minutes Intravenous Every 24 hours 07/25/14 0704 07/27/14 1152   07/25/14 0800  aztreonam (AZACTAM) 2 g in dextrose 5 % 50 mL IVPB  Status:  Discontinued     2 g100 mL/hr over 30 Minutes Intravenous Every 8 hours 07/25/14 0705 07/26/14 1723   07/25/14 0000  levofloxacin (LEVAQUIN) IVPB 750 mg     750 mg100 mL/hr over 90 Minutes Intravenous  Once 08/03/2014 2349 07/25/14 0325   07/25/14 0000  aztreonam (AZACTAM) 2 g in dextrose 5 % 50 mL IVPB     2 g100 mL/hr over 30 Minutes Intravenous  Once 08/04/2014 2349 07/25/14 0146   07/25/14 0000  vancomycin (VANCOCIN) IVPB 1000 mg/200 mL premix     1,000 mg200 mL/hr over 60 Minutes Intravenous  Once 07/27/2014 2349 07/25/14 0435   07/25/14 0000  levofloxacin (LEVAQUIN) IVPB 750 mg  Status:  Discontinued     750 mg100 mL/hr over 90 Minutes Intravenous  Once 08/18/2014 2352 07/28/2014 2354   07/25/14 0000  aztreonam (AZACTAM) 2 g in dextrose 5 % 50 mL IVPB  Status:  Discontinued     2 g100 mL/hr over 30 Minutes Intravenous  Once 08/17/2014 2352 08/15/2014 2354   07/25/14 0000  vancomycin (VANCOCIN) IVPB 1000 mg/200 mL premix  Status:  Discontinued     1,000 mg200 mL/hr over 60 Minutes Intravenous  Once 08/04/2014 2352  08/20/2014 2354      Medications: Scheduled Meds: . antiseptic oral rinse  7 mL Mouth Rinse QID  .  ceFAZolin (ANCEF) IV  2 g Intravenous 3 times per day  . chlorhexidine  15 mL Mouth Rinse BID  . enoxaparin (LOVENOX) injection  95 mg Subcutaneous Q12H  . famotidine (PEPCID) IV  20 mg Intravenous Q12H  . feeding supplement (VITAL HIGH PROTEIN)  1,000 mL Per Tube Q24H  . fentaNYL  50 mcg Intravenous Once  . furosemide  40 mg Intravenous Q12H  . haloperidol lactate  4 mg Intravenous 3 times per day  . sodium chloride  3 mL Intravenous Q12H   Continuous Infusions: . sodium chloride 10 mL/hr at 08/01/14 1010  . fentaNYL infusion INTRAVENOUS 150 mcg/hr (08/01/14 1010)  . phenylephrine (NEO-SYNEPHRINE) Adult infusion 100 mcg/min (08/01/14 1130)   PRN Meds:.Place/Maintain arterial line **AND** sodium chloride, fentaNYL, fentaNYL, midazolam, MUSCLE RUB, ondansetron **OR** ondansetron (ZOFRAN) IV    Objective: Weight change: -20 lb (-9.072 kg)  Intake/Output Summary (Last 24 hours) at 08/01/14 1400 Last data filed at 08/01/14 1010  Gross per 24 hour  Intake 1939.92 ml  Output   1495 ml  Net 444.92 ml   Blood pressure  114/78, pulse 87, temperature 98.9 F (37.2 C), temperature source Oral, resp. rate 18, height  (1.473 m), weight 191 lb (86.637 kg), SpO2 94 %. Temp:  [98 F (36.7 C)-99 F (37.2 C)] 98.9 F (37.2 C) (02/09 1137) Pulse Rate:  [76-135] 87 (02/09 1233) Resp:  [14-34] 18 (02/09 1233) BP: (67-186)/(35-107) 114/78 mmHg (02/09 0900) SpO2:  [84 %-100 %] 94 % (02/09 1233) FiO2 (%):  [40 %-60 %] 60 % (02/09 1233) Weight:  [191 lb (86.637 kg)] 191 lb (86.637 kg) (02/09 0347)  Physical Exam: General: Intubated restrained  Cardiovascular regular rate, no murmurs Pulmonary: Relatively clear anteriorly  Abdomen obese  Skin patient has left IJ in place CBC: CBC Latest Ref Rng 08/01/2014 07/31/2014 07/30/2014  WBC 4.0 - 10.5 K/uL 10.7(H) 6.2 6.1  Hemoglobin 12.0 - 15.0  g/dL 1.6(X) 0.9(U) 8.9(L)  Hematocrit 36.0 - 46.0 % 28.9(L) 29.2(L) 28.8(L)  Platelets 150 - 400 K/uL 374 404(H) 432(H)      BMET  Recent Labs  07/31/14 0430 08/01/14 0407  NA 137 138  K 4.1 3.2*  CL 99 101  CO2 30 27  GLUCOSE 81 99  BUN 16 17  CREATININE 1.15* 1.21*  CALCIUM 7.5* 7.5*     Liver Panel  No results for input(s): PROT, ALBUMIN, AST, ALT, ALKPHOS, BILITOT, BILIDIR, IBILI in the last 72 hours.     Sedimentation Rate No results for input(s): ESRSEDRATE in the last 72 hours. C-Reactive Protein No results for input(s): CRP in the last 72 hours.  Micro Results: Recent Results (from the past 720 hour(s))  Blood Culture (routine x 2)     Status: None   Collection Time: 07/25/14  1:00 AM  Result Value Ref Range Status   Specimen Description BLOOD RIGHT FEMORAL ARTERY  Final   Special Requests BOTTLES DRAWN AEROBIC AND ANAEROBIC 5CC EA  Final   Culture   Final    STAPHYLOCOCCUS AUREUS Note: RIFAMPIN AND GENTAMICIN SHOULD NOT BE USED AS SINGLE DRUGS FOR TREATMENT OF STAPH INFECTIONS. Note: Gram Stain Report Called to,Read Back By and Verified With: CELENIE SOSA 07/26/14 5:30AM THOMI/ORTCH Performed at [facility]    Report Status 07/28/2014 FINAL  Final   Organism ID, Bacteria STAPHYLOCOCCUS AUREUS  Final      Susceptibility   Staphylococcus aureus - MIC*    CLINDAMYCIN <=0.25 SENSITIVE Sensitive     ERYTHROMYCIN <=0.25 SENSITIVE Sensitive     GENTAMICIN <=0.5 SENSITIVE Sensitive     LEVOFLOXACIN 0.25 SENSITIVE Sensitive     OXACILLIN 0.5 SENSITIVE Sensitive     PENICILLIN >=0.5 RESISTANT Resistant     RIFAMPIN <=0.5 SENSITIVE Sensitive     TRIMETH/SULFA <=10 SENSITIVE Sensitive     VANCOMYCIN 1 SENSITIVE Sensitive     TETRACYCLINE <=1 SENSITIVE Sensitive     MOXIFLOXACIN <=0.25 SENSITIVE Sensitive     * STAPHYLOCOCCUS AUREUS  Blood Culture (routine x 2)     Status: None   Collection Time: 07/25/14  1:00 AM  Result Value Ref Range Status    Specimen Description BLOOD RIGHT ARM  Final   Special Requests BOTTLES DRAWN AEROBIC ONLY 2CC  Final   Culture   Final    NO GROWTH 5 DAYS Note: Culture results may be compromised due to an inadequate volume of blood received in culture bottles. Performed at [facility]    Report Status 07/31/2014 FINAL  Final  Urine culture     Status: None   Collection Time: 07/25/14  1:27  AM  Result Value Ref Range Status   Specimen Description URINE, CATHETERIZED  Final   Special Requests NONE  Final   Colony Count   Final    80,000 COLONIES/ML [facility]formed at [facility]    Culture   Final    PROTEUS MIRABILIS Performed at [facility]    Report Status 07/27/2014 FINAL  Final   Organism ID, Bacteria PROTEUS MIRABILIS  Final      Susceptibility   Proteus mirabilis - MIC*    AMPICILLIN <=2 SENSITIVE Sensitive     CEFAZOLIN 8 SENSITIVE Sensitive     CEFTRIAXONE <=1 SENSITIVE Sensitive     CIPROFLOXACIN >=4 RESISTANT Resistant     GENTAMICIN 8 INTERMEDIATE Intermediate     LEVOFLOXACIN >=8 RESISTANT Resistant     NITROFURANTOIN 128 RESISTANT Resistant     TOBRAMYCIN 8 INTERMEDIATE Intermediate     TRIMETH/SULFA >=320 RESISTANT Resistant     PIP/TAZO <=4 SENSITIVE Sensitive     * PROTEUS MIRABILIS  Respiratory virus panel (routine influenza)     Status: None   Collection Time: 07/25/14 12:13 PM  Result Value Ref Range Status   Respiratory Syncytial Virus A Negative Negative Final   Respiratory Syncytial Virus B Negative Negative Final   Influenza A Negative Negative Final   Influenza B Negative Negative Final   Parainfluenza 1 Negative Negative Final   Parainfluenza 2 Negative Negative Final   Parainfluenza 3 Negative Negative Final   Metapneumovirus Negative Negative Final   Rhinovirus Negative Negative Final   Adenovirus Negative Negative Final    Comment: (NOTE) Performed At: [facility] [address] [place], [place] [id number] Mila HomerHancock William F  MD NW:[id number]:[phone]   MRSA PCR Screening     Status: None   Collection Time: 07/25/14  2:59 PM  Result Value Ref Range Status   MRSA by PCR NEGATIVE NEGATIVE Final    Comment:        The GeneXpert MRSA Assay (FDA approved for NASAL specimens only), is one component of a comprehensive MRSA colonization surveillance program. It is not intended to diagnose MRSA infection nor to guide or monitor treatment for MRSA infections.   Culture, blood (routine x 2)     Status: None (Preliminary result)   Collection Time: 07/28/14  3:40 PM  Result Value Ref Range Status   Specimen Description BLOOD NECK LEFT  Final   Special Requests BOTTLES DRAWN AEROBIC AND ANAEROBIC 10CC  Final   Culture   Final           BLOOD CULTURE RECEIVED NO GROWTH TO DATE CULTURE WILL BE HELD FOR 5 DAYS BEFORE ISSUING A FINAL NEGATIVE REPORT Performed at [facility]    Report Status PENDING  Incomplete  Culture, blood (routine x 2)     Status: None (Preliminary result)   Collection Time: 07/28/14  7:26 PM  Result Value Ref Range Status   Specimen Description BLOOD RIGHT HAND  Final   Special Requests BOTTLES DRAWN AEROBIC ONLY 3CC  Final   Culture   Final           BLOOD CULTURE RECEIVED NO GROWTH TO DATE CULTURE WILL BE HELD FOR 5 DAYS BEFORE ISSUING A FINAL NEGATIVE REPORT Performed at [facility]    Report Status PENDING  Incomplete  Culture, respiratory (NON-Expectorated)     Status: None   Collection Time: 07/28/14  9:15 PM  Result Value Ref Range Status   Specimen Description TRACHEAL ASPIRATE  Final  Special Requests NONE  Final   Gram Stain   Final    NO WBC SEEN NO SQUAMOUS EPITHELIAL CELLS SEEN NO ORGANISMS SEEN Performed at [facility]    Culture   Final    NO GROWTH 2 DAYS Performed at [facility]    Report Status 07/31/2014 FINAL  Final    Studies/Results: Dg Chest Port 1 View  08/01/2014   CLINICAL DATA:  Endotracheal tube replacement; was removed by  patient. Initial encounter.  EXAM: PORTABLE CHEST - 1 VIEW  COMPARISON:  Chest radiograph performed 07/31/2014  FINDINGS: The patient's endotracheal tube is seen ending less than 1 cm above the carina. This should be retracted 2-3 cm, as deemed clinically appropriate.  A left IJ line is noted ending about the mid to distal SVC.  The enteric tube is seen ending overlying the fundus of the stomach, difficult to fully assess due to poor characterization of the left hemidiaphragm. The side port is noted at the distal esophagus. This could be advanced approximately 9 cm, as deemed clinically appropriate.  The lungs are significantly hypoexpanded. There is elevation of the right hemidiaphragm. Left basilar and mid lung airspace opacity raises concern for pneumonia, though mild asymmetric interstitial edema might have a similar appearance. A small left pleural effusion is seen. No pneumothorax is identified.  The cardiomediastinal silhouette is borderline normal in size. No acute osseous abnormalities are identified.  IMPRESSION: 1. Endotracheal tube seen ending less than 1 cm above the carina. This should be retracted 2-3 cm, as deemed clinically appropriate. 2. Enteric tube noted with the sideport overlying the distal esophagus. This could be advanced approximately 9 cm, as deemed clinically appropriate. 3. Lungs significantly hypoexpanded, with elevation the right hemidiaphragm. Left basilar and mid lung airspace opacity raises concern for pneumonia, though mild asymmetric interstitial edema might have a similar appearance. This is similar in appearance to the prior study. Small left pleural effusion noted.  These results were called by telephone at the time of interpretation on 08/01/2014 at 1:50 am to [facility], who verbally acknowledged these results.   Electronically Signed   By: Roanna Raider M.D.   On: 08/01/2014 01:53   Dg Chest Port 1 View  07/31/2014   CLINICAL DATA:  Acute respiratory failure,  sepsis  EXAM: PORTABLE CHEST - 1 VIEW  COMPARISON:  Portable chest x-ray of July 30, 2014  FINDINGS: The lungs are slightly better inflated today. Increased retrocardiac density persists. The left hemidiaphragm is obscured. The cardiac silhouette is top-normal in size. The pulmonary vascularity is not clearly engorged.  The endotracheal tube tip lies just under 2 cm above the crotch of the carina. The esophagogastric tube tip projects below the inferior margin of the image. The left internal jugular venous catheter tip projects over the midportion of the SVC.  IMPRESSION: Improved aeration of both lungs. There is persistent left lower lobe atelectasis or pneumonia. The endotracheal tube tip lies just under 2 cm above the crotch of the carina. Withdrawal by 1-2 cm is recommended.   Electronically Signed   By: David  [place]   On: 07/31/2014 07:31   Dg Abd Portable 1v  08/01/2014   CLINICAL DATA:  54 year old female enteric tube placement. Initial encounter.  EXAM: PORTABLE ABDOMEN - 1 VIEW  COMPARISON:  0129 hours the same day and earlier.  FINDINGS: Portable AP view at 0808 hours. Enteric tube has been advanced, the side hole now projects at the level of the gastric  body. The tip is at the midline.  Stable cholecystectomy clips. Stable IVC filter. Stable bowel gas pattern and visible osseous structures.  IMPRESSION: Enteric tube advanced, side hole at the level of the gastric body.   Electronically Signed   By: Odessa Fleming M.D.   On: 08/01/2014 08:24   Dg Abd Portable 1v  08/01/2014   CLINICAL DATA:  Nasogastric tube placement.  Initial encounter.  EXAM: PORTABLE ABDOMEN - 1 VIEW  COMPARISON:  Abdominal radiograph performed 07/28/2014  FINDINGS: The patient's enteric tube is noted ending likely overlying the fundus of the stomach.  On correlation with the concurrent chest radiograph, the side port is noted overlying the distal esophagus. This could be advanced approximately 9 cm, as deemed clinically appropriate.   A small amount of air is noted in the stomach. The visualized bowel gas pattern is grossly unremarkable. No free intra-abdominal air is identified, though evaluation for free air is limited on a single supine view. An IVC filter is noted. Clips are noted within the right upper quadrant, reflecting prior cholecystectomy. Mild degenerative change is noted along the lumbar spine, with lateral osteophytes at the mid lumbar spine.  IMPRESSION: 1. Enteric tube noted ending likely overlying the fundus of the stomach. On correlation with concurrent chest radiograph, the side port is noted overlying the distal esophagus. This could be advanced approximately 9 cm, as deemed clinically appropriate. 2. Unremarkable bowel gas pattern; no free intra-abdominal air seen.   Electronically Signed   By: Roanna Raider M.D.   On: 08/01/2014 01:57      Assessment/Plan:  Active Problems:   Sepsis   Sepsis associated hypotension   Sepsis due to urinary tract infection   Essential hypertension   Moderate malnutrition   Anemia of chronic disease   Protein-calorie malnutrition, severe   Staphylococcus aureus bacteremia with sepsis   Discitis of thoracic region   Acute respiratory failure   Acute encephalopathy   Severe sepsis   Bacteremia due to Staphylococcus aureus   Acute on chronic respiratory failure with hypoxia   Midline thoracic back pain   Sacral decubitus ulcer   Chronic diastolic CHF (congestive heart failure)   Normocytic anemia   Chronic indwelling Foley catheter   Paraplegia   S/P BKA (below knee amputation) bilateral   Moderate protein-calorie malnutrition   Acute respiratory failure with hypoxia   Encephalopathy acute    [name] is a 54 y.o. female with  . female with Paralysis, admitted with sepsis found to have methicillin sensitive Staphylococcus aureus bacteremia.  #1    [place] [facility] Staphylococcus aureus  bacteremia   Staphylococcus aureus bacteremia (SAB) is associated with a high rate of complications and mortality. Specific aspects of clinical management are critical to optimizing the outcome of patients with SAB. Therefore, the [facility] Antimicrobial Management Team [facility]) has initiated an intervention aimed at improving the management of SAB at [facility]. To do so, Infectious Diseases physicians are providing an evidence-based consult for the management of all patients with SAB.     Yes No Comments  Perform follow-up blood cultures (even if the patient is afebrile) to ensure clearance of bacteremia [X]  [ ]   blood cultures being drawn pm 07/28/14  day that she had new IJ placed , NGTD  Remove vascular catheter and obtain follow-up blood cultures after the removal of the catheter [ ]  [ ]  She had IJ placed before I would have preferred, may want to consider DC it certainly  if her cultures from the fifth are positive he will need to come out   Perform echocardiography to evaluate for endocarditis (transthoracic ECHO is 40-50% sensitive, TEE is > 90% sensitive) [ ]  [ ]  Please keep in mind, that neither test can definitively EXCLUDE endocarditis, and that should clinical suspicion remain high for endocarditis the patient should then still be treated with an "endocarditis" duration of therapy = 6 weeks ordered  TTE SHOWS SMALL PERICARDIAL EFFUSION. I would consider ASKING CARDIOLOGY TO GET TEE WHILE SHE IS ON THE VENTILATOR  Consult electrophysiologist to evaluate implanted cardiac device (pacemaker, ICD) [ ]  [ ]    Ensure source control [ ]  [ ]  Have all abscesses been drained effectively? Have deep seeded infections (septic joints or osteomyelitis) had appropriate surgical debridement?  Not clear what source is pneumonia? Her back  Investigate for "metastatic" sites of infection [ ]  [ ]  Does the patient have ANY symptom or physical exam finding that would suggest a deeper  infection (back or neck pain that may be suggestive of vertebral osteomyelitis or epidural abscess, muscle pain that could be a symptom of pyomyositis)?  Keep in mind that for deep seeded infections MRI imaging with contrast is preferred rather than other often insensitive tests such as plain x-rays, especially early in a patient's presentation.   GIVEN HER SIGNIFICANT BACK PAIN AND FINDINGS ON PLAIN FILM OF CHEST I WOULD RECOMMEND EITHER MRI T SPINE   Change antibiotic therapy to ANCEF -J Frens pointed out that she has had tract stone during this admission without difficulty  [ ]  [ ]  Beta-lactam antibiotics are preferred for MSSA due to higher cure rates.  If on Vancomycin, goal trough should be 15 - 20 mcg/mL  Estimated duration of IV antibiotic therapy: 8 weeks esp if she has diskitis [ ]  [ ]  Consult case management for probably prolonged outpatient IV antibiotic therapy   #2 CAP HCAP ?: levaquin was restarted (which she was on previously) she has had 6 days of this--no further role for this antibiotic to be continued I dc it again yesterday and kept her on ancef  #3 Screening: check HIV and hep panel     LOS: 8 days   Paulette Blanch [place] 08/01/2014, 2:00 PM

## 2014-08-01 NOTE — Procedures (Signed)
Extubation Procedure Note  Patient Details:   Name: [name] DOB: [birth year] MRN: [id number]   Airway Documentation:  Airway 7.5 mm (Active)  Secured at (cm) 21 cm 08/01/2014 12:21 AM  Measured From Lips 08/01/2014 12:21 AM  Secured Location Center 08/01/2014 12:21 AM  Secured By [employer] Tube Holder 08/01/2014 12:21 AM  Tube Holder Repositioned Yes 08/01/2014 12:21 AM  Site Condition Dry 08/01/2014 12:21 AM    Evaluation  O2 sats: currently acceptable Complications: No apparent complications Patient did not tolerate procedure well. Bilateral Breath Sounds: Diminished, Rhonchi Suctioning: Oral, Airway Yes Patient self extubated. Placed patient on 100% NRB. Patient was later re intubated per CCM doctor Leafy HalfSnider, Kaydon [place] 08/01/2014, 12:25 AM

## 2014-08-02 ENCOUNTER — Inpatient Hospital Stay (HOSPITAL_COMMUNITY): Payer: BLUE CROSS/BLUE SHIELD

## 2014-08-02 DIAGNOSIS — J90 Pleural effusion, not elsewhere classified: Secondary | ICD-10-CM

## 2014-08-02 DIAGNOSIS — Z86711 Personal history of pulmonary embolism: Secondary | ICD-10-CM

## 2014-08-02 LAB — POCT I-STAT 3, ART BLOOD GAS (G3+)
Acid-Base Excess: 2 mmol/L (ref 0.0–2.0)
Bicarbonate: 26.2 mEq/L — ABNORMAL HIGH (ref 20.0–24.0)
O2 SAT: 96 %
TCO2: 27 mmol/L (ref 0–100)
pCO2 arterial: 39.4 mmHg (ref 35.0–45.0)
pH, Arterial: 7.43 (ref 7.350–7.450)
pO2, Arterial: 81 mmHg (ref 80.0–100.0)

## 2014-08-02 LAB — GLUCOSE, CAPILLARY
GLUCOSE-CAPILLARY: 102 mg/dL — AB (ref 70–99)
GLUCOSE-CAPILLARY: 127 mg/dL — AB (ref 70–99)
GLUCOSE-CAPILLARY: 129 mg/dL — AB (ref 70–99)
GLUCOSE-CAPILLARY: 141 mg/dL — AB (ref 70–99)
Glucose-Capillary: 108 mg/dL — ABNORMAL HIGH (ref 70–99)
Glucose-Capillary: 89 mg/dL (ref 70–99)
Glucose-Capillary: 110 mg/dL — ABNORMAL HIGH (ref 70–99)
Glucose-Capillary: 91 mg/dL (ref 70–99)
Glucose-Capillary: 241 mg/dL — ABNORMAL HIGH (ref 70–99)

## 2014-08-02 LAB — HEPATITIS PANEL, ACUTE
HCV AB: NEGATIVE
HEP A IGM: NONREACTIVE
Hep B C IgM: NONREACTIVE
Hepatitis B Surface Ag: NEGATIVE

## 2014-08-02 LAB — MAGNESIUM: Magnesium: 1.7 mg/dL (ref 1.5–2.5)

## 2014-08-02 LAB — PHOSPHORUS: Phosphorus: 3.7 mg/dL (ref 2.3–4.6)

## 2014-08-02 MED ORDER — DEXTROSE 5 % IV SOLN
0.0000 ug/min | INTRAVENOUS | Status: DC
  Administered 2014-08-02 (×2): 15 ug/min via INTRAVENOUS
  Administered 2014-08-02: 10 ug/min via INTRAVENOUS
  Administered 2014-08-03: 14 ug/min via INTRAVENOUS
  Administered 2014-08-03: 12 ug/min via INTRAVENOUS
  Filled 2014-08-02 (×5): qty 4

## 2014-08-02 MED ORDER — MAGNESIUM SULFATE 2 GM/50ML IV SOLN
2.0000 g | Freq: Once | INTRAVENOUS | Status: AC
  Administered 2014-08-02: 2 g via INTRAVENOUS

## 2014-08-02 MED ORDER — MAGNESIUM SULFATE 2 GM/50ML IV SOLN
INTRAVENOUS | Status: AC
  Filled 2014-08-02: qty 50

## 2014-08-02 MED ORDER — ENOXAPARIN SODIUM 40 MG/0.4ML ~~LOC~~ SOLN
40.0000 mg | SUBCUTANEOUS | Status: DC
  Administered 2014-08-03 – 2014-08-08 (×6): 40 mg via SUBCUTANEOUS
  Filled 2014-08-02 (×8): qty 0.4

## 2014-08-02 NOTE — Progress Notes (Signed)
RT notified O2 sats 85.  Candace SkainsJuan Davyon Fisch RN

## 2014-08-02 NOTE — Progress Notes (Signed)
[facility]       Subjective: Intubated restrained  Antibiotics:  Anti-infectives    Start     Dose/Rate Route Frequency Ordered Stop   07/31/14 1515  ceFAZolin (ANCEF) IVPB 2 g/50 mL premix     2 g 100 mL/hr over 30 Minutes Intravenous 3 times per day 07/31/14 1510     07/28/14 2200  levofloxacin (LEVAQUIN) IVPB 750 mg  Status:  Discontinued     750 mg 100 mL/hr over 90 Minutes Intravenous Every 24 hours 07/28/14 2140 07/31/14 1433   07/26/14 0600  vancomycin (VANCOCIN) IVPB 750 mg/150 ml premix  Status:  Discontinued     750 mg 75 mL/hr over 120 Minutes Intravenous Every 12 hours 07/26/14 0545 07/30/14 2016   07/26/14 0200  levofloxacin (LEVAQUIN) IVPB 750 mg  Status:  Discontinued     750 mg 100 mL/hr over 90 Minutes Intravenous Every 24 hours 07/25/14 0704 07/27/14 1152   07/25/14 0800  aztreonam (AZACTAM) 2 g in dextrose 5 % 50 mL IVPB  Status:  Discontinued     2 g 100 mL/hr over 30 Minutes Intravenous Every 8 hours 07/25/14 0705 07/26/14 1723   07/25/14 0000  levofloxacin (LEVAQUIN) IVPB 750 mg     750 mg 100 mL/hr over 90 Minutes Intravenous  Once Jul 31, 2014 2349 07/25/14 0325   07/25/14 0000  aztreonam (AZACTAM) 2 g in dextrose 5 % 50 mL IVPB     2 g 100 mL/hr over 30 Minutes Intravenous  Once 07-31-14 2349 07/25/14 0146   07/25/14 0000  vancomycin (VANCOCIN) IVPB 1000 mg/200 mL premix     1,000 mg 200 mL/hr over 60 Minutes Intravenous  Once 07-31-2014 2349 07/25/14 0435   07/25/14 0000  levofloxacin (LEVAQUIN) IVPB 750 mg  Status:  Discontinued     750 mg 100 mL/hr over 90 Minutes Intravenous  Once 07/31/2014 2352 2014-07-31 2354   07/25/14 0000  aztreonam (AZACTAM) 2 g in dextrose 5 % 50 mL IVPB  Status:  Discontinued     2 g 100 mL/hr over 30 Minutes Intravenous  Once 2014-07-31 2352 July 31, 2014 2354   07/25/14 0000  vancomycin (VANCOCIN) IVPB 1000 mg/200 mL premix  Status:  Discontinued     1,000 mg 200 mL/hr over 60 Minutes Intravenous   Once 07-31-2014 2352 31-Jul-2014 2354      Medications: Scheduled Meds: . antiseptic oral rinse  7 mL Mouth Rinse QID  .  ceFAZolin (ANCEF) IV  2 g Intravenous 3 times per day  . chlorhexidine  15 mL Mouth Rinse BID  . [START ON 08/03/2014] enoxaparin (LOVENOX) injection  40 mg Subcutaneous Q24H  . famotidine (PEPCID) IV  20 mg Intravenous Q12H  . feeding supplement (VITAL HIGH PROTEIN)  1,000 mL Per Tube Q24H  . fentaNYL  50 mcg Intravenous Once  . furosemide  40 mg Intravenous Q12H  . haloperidol lactate  4 mg Intravenous 3 times per day  . sodium chloride  3 mL Intravenous Q12H   Continuous Infusions: . sodium chloride 10 mL/hr at 08/02/14 0900  . fentaNYL infusion INTRAVENOUS 225 mcg/hr (08/02/14 1430)  . norepinephrine (LEVOPHED) Adult infusion 10 mcg/min (08/02/14 1530)  . phenylephrine (NEO-SYNEPHRINE) Adult infusion 200 mcg/min (08/02/14 1619)   PRN Meds:.Place/Maintain arterial line **AND** sodium chloride, fentaNYL, fentaNYL, midazolam, MUSCLE RUB, ondansetron **OR** ondansetron (ZOFRAN) IV    Objective: Weight change: 3 lb (1.361 kg)  Intake/Output Summary (Last 24 hours) at 08/02/14 1707 Last data filed at 08/02/14  1600  Gross per 24 hour  Intake 2099.86 ml  Output   1705 ml  Net 394.86 ml   Blood pressure 96/72, pulse 129, temperature 99.2 F (37.3 C), temperature source Oral, resp. rate 37, height 4\' 10"  (1.473 m), weight 194 lb (87.998 kg), SpO2 100 %. Temp:  [98 F (36.7 C)-99.2 F (37.3 C)] 99.2 F (37.3 C) (02/10 1615) Pulse Rate:  [55-129] 129 (02/10 1700) Resp:  [16-37] 37 (02/10 1700) BP: (53-140)/(31-85) 96/72 mmHg (02/10 1700) SpO2:  [81 %-100 %] 100 % (02/10 1700) FiO2 (%):  [60 %-100 %] 100 % (02/10 1611) Weight:  [194 lb (87.998 kg)] 194 lb (87.998 kg) (02/10 0350)  Physical Exam: General: Intubated restrained  Cardiovascular regular rate, no murmurs Pulmonary: More coarse air sounds  Abdomen obese  Skin patient has left IJ in place Neuro  qudriplegic CBC: CBC Latest Ref Rng 08/01/2014 07/31/2014 07/30/2014  WBC 4.0 - 10.5 K/uL 10.7(H) 6.2 6.1  Hemoglobin 12.0 - 15.0 g/dL 1.6(X) 0.9(U) 8.9(L)  Hematocrit 36.0 - 46.0 % 28.9(L) 29.2(L) 28.8(L)  Platelets 150 - 400 K/uL 374 404(H) 432(H)      BMET  Recent Labs  07/31/14 0430 08/01/14 0407  NA 137 138  K 4.1 3.2*  CL 99 101  CO2 30 27  GLUCOSE 81 99  BUN 16 17  CREATININE 1.15* 1.21*  CALCIUM 7.5* 7.5*     Liver Panel  No results for input(s): PROT, ALBUMIN, AST, ALT, ALKPHOS, BILITOT, BILIDIR, IBILI in the last 72 hours.     Sedimentation Rate No results for input(s): ESRSEDRATE in the last 72 hours. C-Reactive Protein No results for input(s): CRP in the last 72 hours.  Micro Results: Recent Results (from the past 720 hour(s))  Blood Culture (routine x 2)     Status: None   Collection Time: 07/25/14  1:00 AM  Result Value Ref Range Status   Specimen Description BLOOD RIGHT FEMORAL ARTERY  Final   Special Requests BOTTLES DRAWN AEROBIC AND ANAEROBIC 5CC EA  Final   Culture   Final    STAPHYLOCOCCUS AUREUS Note: RIFAMPIN AND GENTAMICIN SHOULD NOT BE USED AS SINGLE DRUGS FOR TREATMENT OF STAPH INFECTIONS. Note: Gram Stain Report Called to,Read Back By and Verified With: CELENIE SOSA 07/26/14 5:30AM THOMI/ORTCH Performed at [facility]    Report Status 07/28/2014 FINAL  Final   Organism ID, Bacteria STAPHYLOCOCCUS AUREUS  Final      Susceptibility   Staphylococcus aureus - MIC*    CLINDAMYCIN <=0.25 SENSITIVE Sensitive     ERYTHROMYCIN <=0.25 SENSITIVE Sensitive     GENTAMICIN <=0.5 SENSITIVE Sensitive     LEVOFLOXACIN 0.25 SENSITIVE Sensitive     OXACILLIN 0.5 SENSITIVE Sensitive     PENICILLIN >=0.5 RESISTANT Resistant     RIFAMPIN <=0.5 SENSITIVE Sensitive     TRIMETH/SULFA <=10 SENSITIVE Sensitive     VANCOMYCIN 1 SENSITIVE Sensitive     TETRACYCLINE <=1 SENSITIVE Sensitive     MOXIFLOXACIN <=0.25 SENSITIVE Sensitive     *  STAPHYLOCOCCUS AUREUS  Blood Culture (routine x 2)     Status: None   Collection Time: 07/25/14  1:00 AM  Result Value Ref Range Status   Specimen Description BLOOD RIGHT ARM  Final   Special Requests BOTTLES DRAWN AEROBIC ONLY 2CC  Final   Culture   Final    NO GROWTH 5 DAYS Note: Culture results may be compromised due to an inadequate volume of blood received in culture bottles. Performed at [facility]    Report Status 07/31/2014 FINAL  Final  Urine culture     Status: None   Collection Time: 07/25/14  1:27 AM  Result Value Ref Range Status   Specimen Description URINE, CATHETERIZED  Final   Special Requests NONE  Final   Colony Count   Final    80,000 COLONIES/ML Performed at [facility]    Culture   Final    PROTEUS MIRABILIS Performed at [facility]    Report Status 07/27/2014 FINAL  Final   Organism ID, Bacteria PROTEUS MIRABILIS  Final      Susceptibility   Proteus mirabilis - MIC*    AMPICILLIN <=2 SENSITIVE Sensitive     CEFAZOLIN 8 SENSITIVE Sensitive     CEFTRIAXONE <=1 SENSITIVE Sensitive     CIPROFLOXACIN >=4 RESISTANT Resistant     GENTAMICIN 8 INTERMEDIATE Intermediate     LEVOFLOXACIN >=8 RESISTANT Resistant     NITROFURANTOIN 128 RESISTANT Resistant     TOBRAMYCIN 8 INTERMEDIATE Intermediate     TRIMETH/SULFA >=320 RESISTANT Resistant     PIP/TAZO <=4 SENSITIVE Sensitive     * PROTEUS MIRABILIS  Respiratory virus panel (routine influenza)     Status: None   Collection Time: 07/25/14 12:13 PM  Result Value Ref Range Status   Respiratory Syncytial Virus A Negative Negative Final   Respiratory Syncytial Virus B Negative Negative Final   Influenza A Negative Negative Final   Influenza B Negative Negative Final   Parainfluenza 1 Negative Negative Final   Parainfluenza 2 Negative Negative Final   Parainfluenza 3 Negative Negative Final   Metapneumovirus Negative Negative Final   Rhinovirus Negative Negative Final   Adenovirus  Negative Negative Final    Comment: (NOTE) Performed At: [facility] [address] [place], [place] [id number] Mila Homer MD WU:[id number]   MRSA PCR Screening     Status: None   Collection Time: 07/25/14  2:59 PM  Result Value Ref Range Status   MRSA by PCR NEGATIVE NEGATIVE Final    Comment:        The GeneXpert MRSA Assay (FDA approved for NASAL specimens only), is one component of a comprehensive MRSA colonization surveillance program. It is not intended to diagnose MRSA infection nor to guide or monitor treatment for MRSA infections.   Culture, blood (routine x 2)     Status: None (Preliminary result)   Collection Time: 07/28/14  3:40 PM  Result Value Ref Range Status   Specimen Description BLOOD NECK LEFT  Final   Special Requests BOTTLES DRAWN AEROBIC AND ANAEROBIC 10CC  Final   Culture   Final           BLOOD CULTURE RECEIVED NO GROWTH TO DATE CULTURE WILL BE HELD FOR 5 DAYS BEFORE ISSUING A FINAL NEGATIVE REPORT Performed at [facility]    Report Status PENDING  Incomplete  Culture, blood (routine x 2)     Status: None (Preliminary result)   Collection Time: 07/28/14  7:26 PM  Result Value Ref Range Status   Specimen Description BLOOD RIGHT HAND  Final   Special Requests BOTTLES DRAWN AEROBIC ONLY 3CC  Final   Culture   Final           BLOOD CULTURE RECEIVED NO GROWTH TO DATE CULTURE WILL BE HELD FOR 5 DAYS BEFORE ISSUING A FINAL NEGATIVE REPORT Performed at [facility]    Report Status PENDING  Incomplete  Culture, respiratory (NON-Expectorated)  Status: None   Collection Time: 07/28/14  9:15 PM  Result Value Ref Range Status   Specimen Description TRACHEAL ASPIRATE  Final   Special Requests NONE  Final   Gram Stain   Final    NO WBC SEEN NO SQUAMOUS EPITHELIAL CELLS SEEN NO ORGANISMS SEEN Performed at [facility]    Culture   Final    NO GROWTH 2 DAYS Performed at [facility]    Report Status  07/31/2014 FINAL  Final    Studies/Results: Dg Chest Port 1 View  08/02/2014   CLINICAL DATA:  Intubation.  EXAM: PORTABLE CHEST - 1 VIEW  COMPARISON:  None.  FINDINGS: Interim removal of NG tube. Endotracheal tube tip 14 mm above the carina. Left IJ line in stable position. Low lung volumes with persistent elevation the right hemidiaphragm. Progressive left lung infiltrate noted is suggesting pneumonia. Left pleural effusion is most likely present. No pneumothorax. Heart size stable. No pulmonary venous congestion. No acute osseus abnormality.  IMPRESSION: 1. Interim removal of NG tube. Endotracheal tube tip is 14 mm above the carina. Left IJ line in stable position. 2. Progressive left lung infiltrate consistent with pneumonia. Associated left pleural effusion is most likely present. Discussion   Electronically Signed   By: Maisie Fus  Register   On: 08/02/2014 07:35   Dg Chest Port 1 View  08/01/2014   CLINICAL DATA:  Endotracheal tube replacement; was removed by patient. Initial encounter.  EXAM: PORTABLE CHEST - 1 VIEW  COMPARISON:  Chest radiograph performed 07/31/2014  FINDINGS: The patient's endotracheal tube is seen ending less than 1 cm above the carina. This should be retracted 2-3 cm, as deemed clinically appropriate.  A left IJ line is noted ending about the mid to distal SVC.  The enteric tube is seen ending overlying the fundus of the stomach, difficult to fully assess due to poor characterization of the left hemidiaphragm. The side port is noted at the distal esophagus. This could be advanced approximately 9 cm, as deemed clinically appropriate.  The lungs are significantly hypoexpanded. There is elevation of the right hemidiaphragm. Left basilar and mid lung airspace opacity raises concern for pneumonia, though mild asymmetric interstitial edema might have a similar appearance. A small left pleural effusion is seen. No pneumothorax is identified.  The cardiomediastinal silhouette is borderline  normal in size. No acute osseous abnormalities are identified.  IMPRESSION: 1. Endotracheal tube seen ending less than 1 cm above the carina. This should be retracted 2-3 cm, as deemed clinically appropriate. 2. Enteric tube noted with the sideport overlying the distal esophagus. This could be advanced approximately 9 cm, as deemed clinically appropriate. 3. Lungs significantly hypoexpanded, with elevation the right hemidiaphragm. Left basilar and mid lung airspace opacity raises concern for pneumonia, though mild asymmetric interstitial edema might have a similar appearance. This is similar in appearance to the prior study. Small left pleural effusion noted.  These results were called by telephone at the time of interpretation on 08/01/2014 at 1:50 am to [facility] on [facility], who verbally acknowledged these results.   Electronically Signed   By: Roanna Raider M.D.   On: 08/01/2014 01:53   Dg Abd Portable 1v  08/02/2014   CLINICAL DATA:  Orogastric tube placement.  EXAM: PORTABLE ABDOMEN - 1 VIEW  COMPARISON:  August 01, 2014.  FINDINGS: Distal tip of feeding tube is seen in expected position of distal stomach. IVC filter is noted. Status post cholecystectomy. No abnormal bowel gas pattern is  noted. Stool is noted in sigmoid colon.  IMPRESSION: Distal tip of feeding tube has been advanced to distal stomach.   Electronically Signed   By: Lupita Raider, M.D.   On: 08/02/2014 09:55   Dg Abd Portable 1v  08/01/2014   CLINICAL DATA:  54 year old female enteric tube placement. Initial encounter.  EXAM: PORTABLE ABDOMEN - 1 VIEW  COMPARISON:  0129 hours the same day and earlier.  FINDINGS: Portable AP view at 0808 hours. Enteric tube has been advanced, the side hole now projects at the level of the gastric body. The tip is at the midline.  Stable cholecystectomy clips. Stable IVC filter. Stable bowel gas pattern and visible osseous structures.  IMPRESSION: Enteric tube advanced, side hole at the level of the  gastric body.   Electronically Signed   By: Odessa Fleming M.D.   On: 08/01/2014 08:24   Dg Abd Portable 1v  08/01/2014   CLINICAL DATA:  Nasogastric tube placement.  Initial encounter.  EXAM: PORTABLE ABDOMEN - 1 VIEW  COMPARISON:  Abdominal radiograph performed 07/28/2014  FINDINGS: The patient's enteric tube is noted ending likely overlying the fundus of the stomach.  On correlation with the concurrent chest radiograph, the side port is noted overlying the distal esophagus. This could be advanced approximately 9 cm, as deemed clinically appropriate.  A small amount of air is noted in the stomach. The visualized bowel gas pattern is grossly unremarkable. No free intra-abdominal air is identified, though evaluation for free air is limited on a single supine view. An IVC filter is noted. Clips are noted within the right upper quadrant, reflecting prior cholecystectomy. Mild degenerative change is noted along the lumbar spine, with lateral osteophytes at the mid lumbar spine.  IMPRESSION: 1. Enteric tube noted ending likely overlying the fundus of the stomach. On correlation with concurrent chest radiograph, the side port is noted overlying the distal esophagus. This could be advanced approximately 9 cm, as deemed clinically appropriate. 2. Unremarkable bowel gas pattern; no free intra-abdominal air seen.   Electronically Signed   By: Roanna Raider M.D.   On: 08/01/2014 01:57      Assessment/Plan:  Active Problems:   Sepsis   Sepsis associated hypotension   Sepsis due to urinary tract infection   Essential hypertension   Moderate malnutrition   Anemia of chronic disease   Protein-calorie malnutrition, severe   Staphylococcus aureus bacteremia with sepsis   Discitis of thoracic region   Acute respiratory failure   Acute encephalopathy   Severe sepsis   Bacteremia due to Staphylococcus aureus   Acute on chronic respiratory failure with hypoxia   Midline thoracic back pain   Sacral decubitus ulcer    Chronic diastolic CHF (congestive heart failure)   Normocytic anemia   Chronic indwelling Foley catheter   Paraplegia   S/P BKA (below knee amputation) bilateral   Moderate protein-calorie malnutrition   Acute respiratory failure with hypoxia   Encephalopathy acute    Imberly A Merrihew is a 54 y.o. female with  . female with Paralysis, admitted with sepsis found to have methicillin sensitive Staphylococcus aureus bacteremia.  #1    [facility] Staphylococcus aureus bacteremia   Staphylococcus aureus bacteremia (SAB) is associated with a high rate of complications and mortality. Specific aspects of clinical management are critical to optimizing the outcome of patients with SAB. Therefore, the [facility] Antimicrobial Management Team [facility]) has initiated an intervention aimed at improving the management of SAB at [facility]. To do so, Infectious Diseases physicians are providing an evidence-based consult for the management of all patients with SAB.     Yes No Comments  Perform follow-up blood cultures (even if the patient is afebrile) to ensure clearance of bacteremia [X]  [ ]   blood cultures being drawn pm 07/28/14  day that she had new IJ placed , NGTD  Remove vascular catheter and obtain follow-up blood cultures after the removal of the catheter [ ]  [ ]  She had IJ placed before I would have preferred, may want to consider DC it certainly if her cultures from the fifth are positive he will need to come out   Perform echocardiography to evaluate for endocarditis (transthoracic ECHO is 40-50% sensitive, TEE is > 90% sensitive) [ ]  [ ]  Please keep in mind, that neither test can definitively EXCLUDE endocarditis, and that should clinical suspicion remain high for endocarditis the patient should then still be treated with an "endocarditis" duration of therapy = 6 weeks ordered  TTE SHOWS SMALL PERICARDIAL EFFUSION. I  would consider ASKING CARDIOLOGY TO GET TEE WHILE SHE IS ON THE VENTILATOR  Consult electrophysiologist to evaluate implanted cardiac device (pacemaker, ICD) [ ]  [ ]    Ensure source control [ ]  [ ]  Have all abscesses been drained effectively? Have deep seeded infections (septic joints or osteomyelitis) had appropriate surgical debridement?  Not clear what source is pneumonia? Her back  Investigate for "metastatic" sites of infection [ ]  [ ]  Does the patient have ANY symptom or physical exam finding that would suggest a deeper infection (back or neck pain that may be suggestive of vertebral osteomyelitis or epidural abscess, muscle pain that could be a symptom of pyomyositis)?  Keep in mind that for deep seeded infections MRI imaging with contrast is preferred rather than other often insensitive tests such as plain x-rays, especially early in a patient's presentation.   GIVEN HER SIGNIFICANT BACK PAIN AND FINDINGS ON PLAIN FILM OF CHEST I WOULD RECOMMEND EITHER MRI T SPINE WHEN ABLE  Change antibiotic therapy to ANCEF -J Frens pointed out that she has had tract stone during this admission without difficulty  [ ]  [ ]  Beta-lactam antibiotics are preferred for MSSA due to higher cure rates.  If on Vancomycin, goal trough should be 15 - 20 mcg/mL  Estimated duration of IV antibiotic therapy: 8 weeks esp if she has diskitis [ ]  [ ]  Consult case management for probably prolonged outpatient IV antibiotic therapy   #2 CAP HCAP + pleural effusion: no organisms growing from cultures from the 5th. Would send another non bronch BAL or tracheal aspirate  #3 Screening:  hep panel negative and hIV negative  #4 Goals of care: had limited discussion re goals of care with husband and sister     LOS: 8 days   Acey LavCornelius Van Dam 08/02/2014, 5:07 PM

## 2014-08-02 NOTE — Progress Notes (Signed)
*  PRELIMINARY RESULTS* Vascular Ultrasound Lower extremity venous duplex has been completed.  Preliminary findings: No acute DVT. 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   Farrel DemarkJill Eunice, RDMS, RVT  08/02/2014, 10:41 AM

## 2014-08-02 NOTE — Progress Notes (Signed)
Discussed with son to remove restraints, patient 02 Sats in the 1870's. Patient rying to self extubate when unrestrained in the past. Will continue restraints until family meeting today and reassess need. Consulting civil engineerCharge RN notified.  Corliss SkainsJuan Abem Shaddix RN
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
## 2014-08-03 ENCOUNTER — Inpatient Hospital Stay (HOSPITAL_COMMUNITY): Payer: BLUE CROSS/BLUE SHIELD

## 2014-08-03 DIAGNOSIS — R531 Weakness: Secondary | ICD-10-CM

## 2014-08-03 DIAGNOSIS — R509 Fever, unspecified: Secondary | ICD-10-CM

## 2014-08-03 DIAGNOSIS — M4640 Discitis, unspecified, site unspecified: Secondary | ICD-10-CM

## 2014-08-03 DIAGNOSIS — R630 Anorexia: Secondary | ICD-10-CM

## 2014-08-03 DIAGNOSIS — Z66 Do not resuscitate: Secondary | ICD-10-CM

## 2014-08-03 DIAGNOSIS — Z515 Encounter for palliative care: Secondary | ICD-10-CM

## 2014-08-03 DIAGNOSIS — J189 Pneumonia, unspecified organism: Secondary | ICD-10-CM | POA: Insufficient documentation

## 2014-08-03 DIAGNOSIS — G839 Paralytic syndrome, unspecified: Secondary | ICD-10-CM

## 2014-08-03 LAB — BASIC METABOLIC PANEL
Anion gap: 9 (ref 5–15)
BUN: 20 mg/dL (ref 6–23)
CHLORIDE: 93 mmol/L — AB (ref 96–112)
CO2: 27 mmol/L (ref 19–32)
CREATININE: 1.24 mg/dL — AB (ref 0.50–1.10)
Calcium: 7.4 mg/dL — ABNORMAL LOW (ref 8.4–10.5)
GFR calc Af Amer: 57 mL/min — ABNORMAL LOW (ref >90)
GFR calc non Af Amer: 49 mL/min — ABNORMAL LOW (ref >90)
GLUCOSE: 204 mg/dL — AB (ref 70–99)
POTASSIUM: 2.7 mmol/L — AB (ref 3.5–5.1)
Sodium: 129 mmol/L — ABNORMAL LOW (ref 135–145)

## 2014-08-03 LAB — PROCALCITONIN: Procalcitonin: 2.55 ng/mL

## 2014-08-03 LAB — CBC WITH DIFFERENTIAL/PLATELET
BASOS ABS: 0 10*3/uL (ref 0.0–0.1)
BASOS PCT: 0 % (ref 0–1)
EOS PCT: 1 % (ref 0–5)
Eosinophils Absolute: 0.2 10*3/uL (ref 0.0–0.7)
HEMATOCRIT: 32.3 % — AB (ref 36.0–46.0)
HEMOGLOBIN: 9.9 g/dL — AB (ref 12.0–15.0)
Lymphocytes Relative: 11 % — ABNORMAL LOW (ref 12–46)
Lymphs Abs: 2.4 10*3/uL (ref 0.7–4.0)
MCH: 24.8 pg — ABNORMAL LOW (ref 26.0–34.0)
MCHC: 30.7 g/dL (ref 30.0–36.0)
MCV: 80.8 fL (ref 78.0–100.0)
Monocytes Absolute: 2.1 10*3/uL — ABNORMAL HIGH (ref 0.1–1.0)
Monocytes Relative: 10 % (ref 3–12)
NEUTROS ABS: 16.8 10*3/uL — AB (ref 1.7–7.7)
Neutrophils Relative %: 78 % — ABNORMAL HIGH (ref 43–77)
Platelets: 414 10*3/uL — ABNORMAL HIGH (ref 150–400)
RBC: 4 MIL/uL (ref 3.87–5.11)
RDW: 18.3 % — ABNORMAL HIGH (ref 11.5–15.5)
WBC: 21.5 10*3/uL — AB (ref 4.0–10.5)

## 2014-08-03 LAB — GLUCOSE, CAPILLARY
GLUCOSE-CAPILLARY: 173 mg/dL — AB (ref 70–99)
GLUCOSE-CAPILLARY: 182 mg/dL — AB (ref 70–99)
GLUCOSE-CAPILLARY: 169 mg/dL — AB (ref 70–99)
Glucose-Capillary: 218 mg/dL — ABNORMAL HIGH (ref 70–99)
Glucose-Capillary: 153 mg/dL — ABNORMAL HIGH (ref 70–99)
Glucose-Capillary: 147 mg/dL — ABNORMAL HIGH (ref 70–99)

## 2014-08-03 LAB — URINALYSIS W MICROSCOPIC (NOT AT ARMC)
Bilirubin Urine: NEGATIVE
Glucose, UA: NEGATIVE mg/dL
Ketones, ur: NEGATIVE mg/dL
NITRITE: NEGATIVE
PH: 6 (ref 5.0–8.0)
Protein, ur: 30 mg/dL — AB
SPECIFIC GRAVITY, URINE: 1.008 (ref 1.005–1.030)
UROBILINOGEN UA: 0.2 mg/dL (ref 0.0–1.0)

## 2014-08-03 LAB — CULTURE, BLOOD (ROUTINE X 2): Culture: NO GROWTH

## 2014-08-03 LAB — PHOSPHORUS: PHOSPHORUS: 3.1 mg/dL (ref 2.3–4.6)

## 2014-08-03 LAB — MAGNESIUM: Magnesium: 2.2 mg/dL (ref 1.5–2.5)

## 2014-08-03 LAB — HIV ANTIBODY (ROUTINE TESTING W REFLEX): HIV Screen 4th Generation wRfx: NONREACTIVE

## 2014-08-03 MED ORDER — NOREPINEPHRINE BITARTRATE 1 MG/ML IV SOLN
0.0000 ug/min | INTRAVENOUS | Status: DC
  Administered 2014-08-03: 21 ug/min via INTRAVENOUS
  Administered 2014-08-05: 10 ug/min via INTRAVENOUS
  Administered 2014-08-06: 20 ug/min via INTRAVENOUS
  Administered 2014-08-06: 17 ug/min via INTRAVENOUS
  Administered 2014-08-07: 21 ug/min via INTRAVENOUS
  Administered 2014-08-08: 24 ug/min via INTRAVENOUS
  Administered 2014-08-08: 31 ug/min via INTRAVENOUS
  Administered 2014-08-09: 22 ug/min via INTRAVENOUS
  Filled 2014-08-03 (×11): qty 16

## 2014-08-03 MED ORDER — FAMOTIDINE IN NACL 20-0.9 MG/50ML-% IV SOLN: 20.0000 mg | INTRAVENOUS | Status: DC

## 2014-08-03 MED ORDER — CEFEPIME HCL 1 G IJ SOLR
1.0000 g | INTRAMUSCULAR | Status: DC
  Administered 2014-08-03 – 2014-08-04 (×2): 1 g via INTRAVENOUS
  Filled 2014-08-03 (×2): qty 1

## 2014-08-03 MED ORDER — POTASSIUM CHLORIDE 20 MEQ PO PACK
40.0000 meq | PACK | ORAL | Status: DC
  Filled 2014-08-03 (×2): qty 2

## 2014-08-03 MED ORDER — SODIUM CHLORIDE 0.9 % IJ SOLN
10.0000 mL | Freq: Two times a day (BID) | INTRAMUSCULAR | Status: DC
  Administered 2014-08-03 – 2014-08-08 (×10): 10 mL via INTRAVENOUS

## 2014-08-03 MED ORDER — POTASSIUM CHLORIDE 20 MEQ/15ML (10%) PO SOLN
40.0000 meq | ORAL | Status: AC
  Administered 2014-08-03 (×2): 40 meq via ORAL
  Filled 2014-08-03 (×2): qty 30

## 2014-08-03 MED ORDER — VANCOMYCIN HCL IN DEXTROSE 750-5 MG/150ML-% IV SOLN
750.0000 mg | INTRAVENOUS | Status: DC
  Administered 2014-08-03 – 2014-08-07 (×4): 750 mg via INTRAVENOUS
  Filled 2014-08-03 (×5): qty 150

## 2014-08-03 MED ORDER — FAMOTIDINE 40 MG/5ML PO SUSR
20.0000 mg | Freq: Every day | ORAL | Status: DC
  Administered 2014-08-04 – 2014-08-06 (×3): 20 mg
  Filled 2014-08-03 (×3): qty 2.5

## 2014-08-03 MED ORDER — SODIUM CHLORIDE 0.9 % IV BOLUS (SEPSIS)
500.0000 mL | INTRAVENOUS | Status: DC | PRN
  Administered 2014-08-03 – 2014-08-05 (×3): 500 mL via INTRAVENOUS
  Filled 2014-08-03 (×3): qty 500
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
## 2014-08-03 NOTE — Progress Notes (Signed)
CRITICAL VALUE ALERT  Critical value received:  2.7 Potassium  Date of notification:  2.11.16  Time of notification:  0555  Critical value read back: yes  Nurse who received alert:  Candace NorthJennifer Amna Welker, RN  MD notified (1st page):  [facility]/ Dr. Delton CoombesByrum  Time of first page:  [phone]  MD notified (2nd page):  Time of second page:  Responding MD:  [facility]/ Dr. Delton CoombesByrum  Time MD responded:  [phone]

## 2014-08-03 NOTE — Progress Notes (Addendum)
eLink Physician-Brief Progress Note Patient Name: [name] DOB: 04/25/1961 MRN: [id number]   Date of Service  08/03/2014  HPI/Events of Note  RN called reporting max dose of phenylephrine and increasing reqt for NE. Per TEE, evidence of underfilled heart/hypovolemia.   eICU Interventions  1) Dc scheduled Lasix 2) CVP monitoring with goal of 12/15 while intubated 3) PRN NS boluses for CVP < 12     Intervention Category Major Interventions: Shock - evaluation and management  Billy FischerDavid Simonds 08/03/2014, 4:58 PM

## 2014-08-03 NOTE — Progress Notes (Signed)
  Echocardiogram Echocardiogram Transesophageal has been performed.  Candace JourneyLOMBARDO, [name] 08/03/2014, 4:32 PM
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
## 2014-08-03 NOTE — Progress Notes (Signed)
[facility]       Subjective: Intubated restrained,rEACHING FOR A VENTILATOR WITH HER mit  Antibiotics:  Anti-infectives    Start     Dose/Rate Route Frequency Ordered Stop   08/03/14 1100  vancomycin (VANCOCIN) IVPB 750 mg/150 ml premix     750 mg 150 mL/hr over 60 Minutes Intravenous Every 36 hours 08/03/14 1044     08/03/14 1045  ceFEPIme (MAXIPIME) 1 g in dextrose 5 % 50 mL IVPB     1 g 100 mL/hr over 30 Minutes Intravenous Every 24 hours 08/03/14 1037     07/31/14 1515  ceFAZolin (ANCEF) IVPB 2 g/50 mL premix  Status:  Discontinued     2 g 100 mL/hr over 30 Minutes Intravenous 3 times per day 07/31/14 1510 08/03/14 1028   07/28/14 2200  levofloxacin (LEVAQUIN) IVPB 750 mg  Status:  Discontinued     750 mg 100 mL/hr over 90 Minutes Intravenous Every 24 hours 07/28/14 2140 07/31/14 1433   07/26/14 0600  vancomycin (VANCOCIN) IVPB 750 mg/150 ml premix  Status:  Discontinued     750 mg 75 mL/hr over 120 Minutes Intravenous Every 12 hours 07/26/14 0545 07/30/14 2016   07/26/14 0200  levofloxacin (LEVAQUIN) IVPB 750 mg  Status:  Discontinued     750 mg 100 mL/hr over 90 Minutes Intravenous Every 24 hours 07/25/14 0704 07/27/14 1152   07/25/14 0800  aztreonam (AZACTAM) 2 g in dextrose 5 % 50 mL IVPB  Status:  Discontinued     2 g 100 mL/hr over 30 Minutes Intravenous Every 8 hours 07/25/14 0705 07/26/14 1723   07/25/14 0000  levofloxacin (LEVAQUIN) IVPB 750 mg     750 mg 100 mL/hr over 90 Minutes Intravenous  Once 08/05/2014 2349 07/25/14 0325   07/25/14 0000  aztreonam (AZACTAM) 2 g in dextrose 5 % 50 mL IVPB     2 g 100 mL/hr over 30 Minutes Intravenous  Once 08/05/2014 2349 07/25/14 0146   07/25/14 0000  vancomycin (VANCOCIN) IVPB 1000 mg/200 mL premix     1,000 mg 200 mL/hr over 60 Minutes Intravenous  Once 08/07/2014 2349 07/25/14 0435   07/25/14 0000  levofloxacin (LEVAQUIN) IVPB 750 mg  Status:  Discontinued     750 mg 100 mL/hr over 90  Minutes Intravenous  Once 08/18/2014 2352 08/16/2014 2354   07/25/14 0000  aztreonam (AZACTAM) 2 g in dextrose 5 % 50 mL IVPB  Status:  Discontinued     2 g 100 mL/hr over 30 Minutes Intravenous  Once 08/04/2014 2352 08/05/2014 2354   07/25/14 0000  vancomycin (VANCOCIN) IVPB 1000 mg/200 mL premix  Status:  Discontinued     1,000 mg 200 mL/hr over 60 Minutes Intravenous  Once 07/27/2014 2352 07/30/2014 2354      Medications: Scheduled Meds: . antiseptic oral rinse  7 mL Mouth Rinse QID  . ceFEPime (MAXIPIME) IV  1 g Intravenous Q24H  . chlorhexidine  15 mL Mouth Rinse BID  . enoxaparin (LOVENOX) injection  40 mg Subcutaneous Q24H  . [START ON 08/04/2014] famotidine (PEPCID) IV  20 mg Intravenous Q24H  . feeding supplement (VITAL HIGH PROTEIN)  1,000 mL Per Tube Q24H  . fentaNYL  50 mcg Intravenous Once  . furosemide  40 mg Intravenous Q12H  . haloperidol lactate  4 mg Intravenous 3 times per day  . sodium chloride  3 mL Intravenous Q12H  . vancomycin  750 mg Intravenous Q36H   Continuous Infusions: .  sodium chloride 10 mL/hr at 08/02/14 0900  . fentaNYL infusion INTRAVENOUS 300 mcg/hr (08/03/14 1058)  . norepinephrine (LEVOPHED) Adult infusion 15 mcg/min (08/03/14 0800)  . phenylephrine (NEO-SYNEPHRINE) Adult infusion 200 mcg/min (08/03/14 1051)   PRN Meds:.Place/Maintain arterial line **AND** sodium chloride, fentaNYL, fentaNYL, midazolam, MUSCLE RUB, ondansetron **OR** ondansetron (ZOFRAN) IV    Objective: Weight change: 14 lb (6.35 kg)  Intake/Output Summary (Last 24 hours) at 08/03/14 1318 Last data filed at 08/03/14 1000  Gross per 24 hour  Intake 3821.2 ml  Output   1325 ml  Net 2496.2 ml   Blood pressure 108/53, pulse 120, temperature 100.1 F (37.8 C), temperature source Oral, resp. rate 23, height 4\' 10"  (1.473 m), weight 208 lb (94.348 kg), SpO2 100 %. Temp:  [99.2 F (37.3 C)-102.8 F (39.3 C)] 100.1 F (37.8 C) (02/11 1151) Pulse Rate:  [80-129] 120 (02/11  1205) Resp:  [16-37] 23 (02/11 1205) BP: (82-155)/(31-86) 108/53 mmHg (02/11 1000) SpO2:  [99 %-100 %] 100 % (02/11 1205) FiO2 (%):  [80 %-100 %] 80 % (02/11 1205) Weight:  [208 lb (94.348 kg)] 208 lb (94.348 kg) (02/11 0106)  Physical Exam: General: Intubated restrained  Cardiovascular regular rate, no murmurs Pulmonary: More coarse air sounds  Abdomen obese  Skin patient has left IJ in place Neuro qudriplegic CBC: CBC Latest Ref Rng 08/03/2014 08/01/2014 07/31/2014  WBC 4.0 - 10.5 K/uL 21.5(H) 10.7(H) 6.2  Hemoglobin 12.0 - 15.0 g/dL 1.6(X) 8.9(L) 9.0(L)  Hematocrit 36.0 - 46.0 % 32.3(L) 28.9(L) 29.2(L)  Platelets 150 - 400 K/uL 414(H) 374 404(H)      BMET  Recent Labs  08/01/14 0407 08/03/14 0430  NA 138 129*  K 3.2* 2.7*  CL 101 93*  CO2 27 27  GLUCOSE 99 204*  BUN 17 20  CREATININE 1.21* 1.24*  CALCIUM 7.5* 7.4*     Liver Panel  No results for input(s): PROT, ALBUMIN, AST, ALT, ALKPHOS, BILITOT, BILIDIR, IBILI in the last 72 hours.     Sedimentation Rate No results for input(s): ESRSEDRATE in the last 72 hours. C-Reactive Protein No results for input(s): CRP in the last 72 hours.  Micro Results: Recent Results (from the past 720 hour(s))  Blood Culture (routine x 2)     Status: None   Collection Time: 07/25/14  1:00 AM  Result Value Ref Range Status   Specimen Description BLOOD RIGHT FEMORAL ARTERY  Final   Special Requests BOTTLES DRAWN AEROBIC AND ANAEROBIC 5CC EA  Final   Culture   Final    STAPHYLOCOCCUS AUREUS Note: RIFAMPIN AND GENTAMICIN SHOULD NOT BE USED AS SINGLE DRUGS FOR TREATMENT OF STAPH INFECTIONS. Note: Gram Stain Report Called to,Read Back By and Verified With: CELENIE SOSA 07/26/14 5:30AM THOMI/ORTCH Performed at [facility]    Report Status 07/28/2014 FINAL  Final   Organism ID, Bacteria STAPHYLOCOCCUS AUREUS  Final      Susceptibility   Staphylococcus aureus - MIC*    CLINDAMYCIN <=0.25 SENSITIVE Sensitive      ERYTHROMYCIN <=0.25 SENSITIVE Sensitive     GENTAMICIN <=0.5 SENSITIVE Sensitive     LEVOFLOXACIN 0.25 SENSITIVE Sensitive     OXACILLIN 0.5 SENSITIVE Sensitive     PENICILLIN >=0.5 RESISTANT Resistant     RIFAMPIN <=0.5 SENSITIVE Sensitive     TRIMETH/SULFA <=10 SENSITIVE Sensitive     VANCOMYCIN 1 SENSITIVE Sensitive     TETRACYCLINE <=1 SENSITIVE Sensitive     MOXIFLOXACIN <=0.25 SENSITIVE Sensitive     * STAPHYLOCOCCUS AUREUS  Blood Culture (routine x 2)     Status: None   Collection Time: 07/25/14  1:00 AM  Result Value Ref Range Status   Specimen Description BLOOD RIGHT ARM  Final   Special Requests BOTTLES DRAWN AEROBIC ONLY 2CC  Final   Culture   Final    NO GROWTH 5 DAYS Note: Culture results may be compromised due to an inadequate volume of blood received in culture bottles. Performed at [facility]    Report Status 07/31/2014 FINAL  Final  Urine culture     Status: None   Collection Time: 07/25/14  1:27 AM  Result Value Ref Range Status   Specimen Description URINE, CATHETERIZED  Final   Special Requests NONE  Final   Colony Count   Final    80,000 COLONIES/ML Performed at [facility]    Culture   Final    PROTEUS MIRABILIS Performed at [facility]    Report Status 07/27/2014 FINAL  Final   Organism ID, Bacteria PROTEUS MIRABILIS  Final      Susceptibility   Proteus mirabilis - MIC*    AMPICILLIN <=2 SENSITIVE Sensitive     CEFAZOLIN 8 SENSITIVE Sensitive     CEFTRIAXONE <=1 SENSITIVE Sensitive     CIPROFLOXACIN >=4 RESISTANT Resistant     GENTAMICIN 8 INTERMEDIATE Intermediate     LEVOFLOXACIN >=8 RESISTANT Resistant     NITROFURANTOIN 128 RESISTANT Resistant     TOBRAMYCIN 8 INTERMEDIATE Intermediate     TRIMETH/SULFA >=320 RESISTANT Resistant     PIP/TAZO <=4 SENSITIVE Sensitive     * PROTEUS MIRABILIS  Respiratory virus panel (routine influenza)     Status: None   Collection Time: 07/25/14 12:13 PM  Result Value Ref Range  Status   Respiratory Syncytial Virus A Negative Negative Final   Respiratory Syncytial Virus B Negative Negative Final   Influenza A Negative Negative Final   Influenza B Negative Negative Final   Parainfluenza 1 Negative Negative Final   Parainfluenza 2 Negative Negative Final   Parainfluenza 3 Negative Negative Final   Metapneumovirus Negative Negative Final   Rhinovirus Negative Negative Final   Adenovirus Negative Negative Final    Comment: (NOTE) Performed At: [facility] [address] [place], [place] [id number] Mila Homer MD NW:[id number]   MRSA PCR Screening     Status: None   Collection Time: 07/25/14  2:59 PM  Result Value Ref Range Status   MRSA by PCR NEGATIVE NEGATIVE Final    Comment:        The GeneXpert MRSA Assay (FDA approved for NASAL specimens only), is one component of a comprehensive MRSA colonization surveillance program. It is not intended to diagnose MRSA infection nor to guide or monitor treatment for MRSA infections.   Culture, blood (routine x 2)     Status: None   Collection Time: 07/28/14  3:40 PM  Result Value Ref Range Status   Specimen Description BLOOD NECK LEFT  Final   Special Requests BOTTLES DRAWN AEROBIC AND ANAEROBIC 10CC  Final   Culture   Final    NO GROWTH 5 DAYS Performed at [facility]    Report Status 08/03/2014 FINAL  Final  Culture, blood (routine x 2)     Status: None (Preliminary result)   Collection Time: 07/28/14  7:26 PM  Result Value Ref Range Status   Specimen Description BLOOD RIGHT HAND  Final   Special Requests BOTTLES DRAWN AEROBIC ONLY 3CC  Final  Culture   Final           BLOOD CULTURE RECEIVED NO GROWTH TO DATE CULTURE WILL BE HELD FOR 5 DAYS BEFORE ISSUING A FINAL NEGATIVE REPORT Performed at [facility]    Report Status PENDING  Incomplete  Culture, respiratory (NON-Expectorated)     Status: None   Collection Time: 07/28/14  9:15 PM  Result Value Ref Range Status    Specimen Description TRACHEAL ASPIRATE  Final   Special Requests NONE  Final   Gram Stain   Final    NO WBC SEEN NO SQUAMOUS EPITHELIAL CELLS SEEN NO ORGANISMS SEEN Performed at [facility]    Culture   Final    NO GROWTH 2 DAYS Performed at [facility]    Report Status 07/31/2014 FINAL  Final  Culture, respiratory (NON-Expectorated)     Status: None (Preliminary result)   Collection Time: 08/02/14  5:41 PM  Result Value Ref Range Status   Specimen Description TRACHEAL ASPIRATE  Final   Special Requests Normal  Final   Gram Stain   Final    MODERATE WBC PRESENT,BOTH PMN AND MONONUCLEAR RARE SQUAMOUS EPITHELIAL CELLS PRESENT NO ORGANISMS SEEN Performed at [facility]    Culture PENDING  Incomplete   Report Status PENDING  Incomplete    Studies/Results: Dg Chest Port 1 View  08/03/2014   CLINICAL DATA:  Shortness of breath, intubated patient.  EXAM: PORTABLE CHEST - 1 VIEW  COMPARISON:  Portable chest x-ray of February 10th and July 31, 2014  FINDINGS: There is chronic volume loss and increased retrocardiac density on the left. The hemidiaphragm remains obscured. On the right there is persistent hypoinflation. Increased right perihilar density on the right has developed. The cardiac silhouette where visualized is mildly enlarged. The central pulmonary vascularity is engorged.  The endotracheal tube tip projects approximately 3.8 cm above the crotch of the carina. The esophagogastric tube tip projects below the inferior margin of the image. The left internal jugular venous catheter tip projects over the midportion of the SVC.  IMPRESSION: Chronic hypoinflation and interstitial edema and lower lobe atelectasis on the left. Increasing right perihilar density consistent with subsegmental atelectasis. The support tubes and lines are in appropriate position.   Electronically Signed   By: David  [place]   On: 08/03/2014 07:30   Dg Chest Port 1 View  08/02/2014    CLINICAL DATA:  Intubation.  EXAM: PORTABLE CHEST - 1 VIEW  COMPARISON:  None.  FINDINGS: Interim removal of NG tube. Endotracheal tube tip 14 mm above the carina. Left IJ line in stable position. Low lung volumes with persistent elevation the right hemidiaphragm. Progressive left lung infiltrate noted is suggesting pneumonia. Left pleural effusion is most likely present. No pneumothorax. Heart size stable. No pulmonary venous congestion. No acute osseus abnormality.  IMPRESSION: 1. Interim removal of NG tube. Endotracheal tube tip is 14 mm above the carina. Left IJ line in stable position. 2. Progressive left lung infiltrate consistent with pneumonia. Associated left pleural effusion is most likely present. Discussion   Electronically Signed   By: Maisie Fus  Register   On: 08/02/2014 07:35   Dg Abd Portable 1v  08/02/2014   CLINICAL DATA:  Orogastric tube placement.  EXAM: PORTABLE ABDOMEN - 1 VIEW  COMPARISON:  August 01, 2014.  FINDINGS: Distal tip of feeding tube is seen in expected position of distal stomach. IVC filter is noted. Status post cholecystectomy. No abnormal bowel gas pattern is noted. Stool  is noted in sigmoid colon.  IMPRESSION: Distal tip of feeding tube has been advanced to distal stomach.   Electronically Signed   By: Lupita Raider, M.D.   On: 08/02/2014 09:55      Assessment/Plan:  Active Problems:   Sepsis   Sepsis associated hypotension   Sepsis due to urinary tract infection   Essential hypertension   Moderate malnutrition   Anemia of chronic disease   Protein-calorie malnutrition, severe   Staphylococcus aureus bacteremia with sepsis   Discitis of thoracic region   Acute respiratory failure   Acute encephalopathy   Severe sepsis   Bacteremia due to Staphylococcus aureus   Acute on chronic respiratory failure with hypoxia   Midline thoracic back pain   Sacral decubitus ulcer   Chronic diastolic CHF (congestive heart failure)   Normocytic anemia   Chronic  indwelling Foley catheter   Paraplegia   S/P BKA (below knee amputation) bilateral   Moderate protein-calorie malnutrition   Acute respiratory failure with hypoxia   Encephalopathy acute   Palliative care by specialist   DNR (do not resuscitate)    [name] is a 54 y.o. female with  . female with Paralysis, admitted with sepsis found to have methicillin sensitive Staphylococcus aureus bacteremia, diskitis on plain films,  who required intubation remains on the ventilator now with fevers  #1 New onset fevers worsening infiltrates on chest x-ray:  worrisome for healthcare associated pneumonia  I sent cultures yesterday from tracheal aspirate  agree to broaden out to vancomycin and cefepime Follow-up cultures   UA   #2    [facility] Staphylococcus aureus bacteremia   Staphylococcus aureus bacteremia (SAB) is associated with a high rate of complications and mortality. Specific aspects of clinical management are critical to optimizing the outcome of patients with SAB. Therefore, the [facility] Antimicrobial Management Team [facility]) has initiated an intervention aimed at improving the management of SAB at [facility]. To do so, Infectious Diseases physicians are providing an evidence-based consult for the management of all patients with SAB.     Yes No Comments  Perform follow-up blood cultures (even if the patient is afebrile) to ensure clearance of bacteremia [X]  [ ]   blood cultures being drawn pm 07/28/14  day that she had new IJ placed , NGTD  Remove vascular catheter and obtain follow-up blood cultures after the removal of the catheter [ ]  [ ]  She had IJ placed before I would have preferred, may want to consider DC it certainly if her cultures from the fifth are positive he will need to come out   Perform echocardiography to evaluate for endocarditis (transthoracic ECHO is 40-50% sensitive, TEE is >  90% sensitive) [ ]  [ ]  Please keep in mind, that neither test can definitively EXCLUDE endocarditis, and that should clinical suspicion remain high for endocarditis the patient should then still be treated with an "endocarditis" duration of therapy = 6 weeks ordered  GREATLY APPRECIATE CARDIOLOGY PERFORMING TEE WHILE SHE IS ON THE VENTILATOR       Ensure source control [ ]  [ ]  Have all abscesses been drained effectively? Have deep seeded infections (septic joints or osteomyelitis) had appropriate surgical debridement?  Not clear what source is pneumonia? Her back  Investigate for "metastatic" sites of infection [ ]  [ ]  Does the patient have ANY symptom or physical exam finding that would suggest a deeper infection (back or neck pain that may be suggestive of vertebral osteomyelitis or  epidural abscess, muscle pain that could be a symptom of pyomyositis)?  Keep in mind that for deep seeded infections MRI imaging with contrast is preferred rather than other often insensitive tests such as plain x-rays, especially early in a patient's presentation.   GIVEN HER SIGNIFICANT BACK PAIN AND FINDINGS ON PLAIN FILM OF CHEST I WOULD RECOMMEND EITHER MRI T SPINE WHEN ABLE  Change antibiotic therapy to ANCEF -AFTER we de-escalate her HCAP Coverage [ ]  [ ]  Beta-lactam antibiotics are preferred for MSSA due to higher cure rates.  If on Vancomycin, goal trough should be 15 - 20 mcg/mL  Estimated duration of IV antibiotic therapy: 8 weeks esp if she has diskitis [ ]  [ ]  Consult case management for probably prolonged outpatient IV antibiotic therapy        LOS: 9 days   Acey LavCornelius Van Dam 08/03/2014, 1:18 PM

## 2014-08-03 NOTE — Progress Notes (Signed)
Inpatient Diabetes Program Recommendations  AACE/ADA: New Consensus Statement on Inpatient Glycemic Control (2013)  Target Ranges:  Prepandial:   less than 140 mg/dL      Peak postprandial:   less than 180 mg/dL (1-2 hours)      Critically ill patients:  140 - 180 mg/dL   Reason for Visit: Sepsis  Diabetes history: None Outpatient Diabetes medications: None Current orders for Inpatient glycemic control: None  Inpatient Diabetes Program Recommendations Insulin - Meal Coverage: Please consider placing patient on Novolog 2 units Q4hrs for tube feed coverage (should be held if tube feeds are stopped or held for any reason).  Thanks,  Christena DeemShannon Lindsy Cerullo RN, MSN, [facility] Inpatient Diabetes Coordinator Team Pager [phone]

## 2014-08-03 NOTE — Progress Notes (Signed)
UR Completed.  [phone]  

## 2014-08-04 ENCOUNTER — Inpatient Hospital Stay (HOSPITAL_COMMUNITY): Payer: BLUE CROSS/BLUE SHIELD

## 2014-08-04 DIAGNOSIS — B49 Unspecified mycosis: Secondary | ICD-10-CM | POA: Insufficient documentation

## 2014-08-04 LAB — CULTURE, BLOOD (ROUTINE X 2): Culture: NO GROWTH

## 2014-08-04 LAB — BASIC METABOLIC PANEL
Anion gap: 7 (ref 5–15)
BUN: 33 mg/dL — ABNORMAL HIGH (ref 6–23)
CO2: 26 mmol/L (ref 19–32)
Calcium: 7.3 mg/dL — ABNORMAL LOW (ref 8.4–10.5)
Chloride: 95 mmol/L — ABNORMAL LOW (ref 96–112)
Creatinine, Ser: 1.19 mg/dL — ABNORMAL HIGH (ref 0.50–1.10)
GFR calc Af Amer: 60 mL/min — ABNORMAL LOW (ref >90)
GFR calc non Af Amer: 52 mL/min — ABNORMAL LOW (ref >90)
Glucose, Bld: 149 mg/dL — ABNORMAL HIGH (ref 70–99)
POTASSIUM: 3.6 mmol/L (ref 3.5–5.1)
SODIUM: 128 mmol/L — AB (ref 135–145)

## 2014-08-04 LAB — CBC WITH DIFFERENTIAL/PLATELET
BASOS ABS: 0 10*3/uL (ref 0.0–0.1)
BASOS PCT: 0 % (ref 0–1)
Eosinophils Absolute: 0.5 10*3/uL (ref 0.0–0.7)
Eosinophils Relative: 3 % (ref 0–5)
HCT: 27 % — ABNORMAL LOW (ref 36.0–46.0)
Hemoglobin: 8.2 g/dL — ABNORMAL LOW (ref 12.0–15.0)
LYMPHS ABS: 2.2 10*3/uL (ref 0.7–4.0)
Lymphocytes Relative: 14 % (ref 12–46)
MCH: 24.4 pg — AB (ref 26.0–34.0)
MCHC: 30.4 g/dL (ref 30.0–36.0)
MCV: 80.4 fL (ref 78.0–100.0)
Monocytes Absolute: 1.6 10*3/uL — ABNORMAL HIGH (ref 0.1–1.0)
Monocytes Relative: 10 % (ref 3–12)
NEUTROS PCT: 73 % (ref 43–77)
Neutro Abs: 12 10*3/uL — ABNORMAL HIGH (ref 1.7–7.7)
Platelets: 276 10*3/uL (ref 150–400)
RBC: 3.36 MIL/uL — AB (ref 3.87–5.11)
RDW: 18.5 % — AB (ref 11.5–15.5)
WBC: 16.4 10*3/uL — ABNORMAL HIGH (ref 4.0–10.5)

## 2014-08-04 LAB — PROCALCITONIN: Procalcitonin: 1.91 ng/mL

## 2014-08-04 LAB — GLUCOSE, CAPILLARY
GLUCOSE-CAPILLARY: 141 mg/dL — AB (ref 70–99)
GLUCOSE-CAPILLARY: 138 mg/dL — AB (ref 70–99)
Glucose-Capillary: 152 mg/dL — ABNORMAL HIGH (ref 70–99)
Glucose-Capillary: 153 mg/dL — ABNORMAL HIGH (ref 70–99)
Glucose-Capillary: 125 mg/dL — ABNORMAL HIGH (ref 70–99)
Glucose-Capillary: 126 mg/dL — ABNORMAL HIGH (ref 70–99)

## 2014-08-04 LAB — MAGNESIUM: Magnesium: 2 mg/dL (ref 1.5–2.5)

## 2014-08-04 LAB — URINE CULTURE
Colony Count: >=100000
SPECIAL REQUESTS: NORMAL

## 2014-08-04 LAB — PHOSPHORUS: PHOSPHORUS: 3 mg/dL (ref 2.3–4.6)

## 2014-08-04 MED ORDER — BISACODYL 10 MG RE SUPP: 10.0000 mg | Freq: Every day | RECTAL | Status: DC | PRN

## 2014-08-04 MED ORDER — POTASSIUM CHLORIDE 20 MEQ/15ML (10%) PO SOLN
20.0000 meq | ORAL | Status: AC
  Administered 2014-08-04 (×2): 20 meq
  Filled 2014-08-04 (×2): qty 15

## 2014-08-04 MED ORDER — DEXTROSE 5 % IV SOLN
1.0000 g | Freq: Two times a day (BID) | INTRAVENOUS | Status: DC
  Administered 2014-08-04 – 2014-08-08 (×9): 1 g via INTRAVENOUS
  Filled 2014-08-04 (×11): qty 1

## 2014-08-04 NOTE — Progress Notes (Signed)
Progress Note from the Palliative Medicine Team at [facility]  Subjective: Diona FantiBabbie is lying in bed and opens her eyes spontaneously but is not following commands. She appears more comfortable today. I spoke with her brother at bedside and updated him. She is being followed by ID. Family remains cautiously hopeful for some recovery but they have agreed she would not want trach or PEG. I will follow up on Monday - please call [phone] with acute palliative needs over the weekend.    Objective: Allergies  Allergen Reactions  . Iodine-131 Hives  . Vancomycin Nausea And Vomiting    [facility] Tolerated vancomycin run in over 2 hours  . Zosyn [Piperacillin Sod-Tazobactam So] Swelling    Tolerated ceftriaxone  . Iohexol Rash   Scheduled Meds: . antiseptic oral rinse  7 mL Mouth Rinse QID  . ceFEPime (MAXIPIME) IV  1 g Intravenous Q12H  . chlorhexidine  15 mL Mouth Rinse BID  . enoxaparin (LOVENOX) injection  40 mg Subcutaneous Q24H  . famotidine  20 mg Per Tube Daily  . feeding supplement (VITAL HIGH PROTEIN)  1,000 mL Per Tube Q24H  . fentaNYL  50 mcg Intravenous Once  . haloperidol lactate  4 mg Intravenous 3 times per day  . sodium chloride  10 mL Intravenous Q12H  . vancomycin  750 mg Intravenous Q36H   Continuous Infusions: . sodium chloride 250 mL/hr at 08/04/14 0400  . fentaNYL infusion INTRAVENOUS 175 mcg/hr (08/04/14 1225)  . norepinephrine (LEVOPHED) Adult infusion 10 mcg/min (08/04/14 0941)  . phenylephrine (NEO-SYNEPHRINE) Adult infusion 100 mcg/min (08/04/14 0945)   PRN Meds:.Place/Maintain arterial line **AND** sodium chloride, bisacodyl, fentaNYL, fentaNYL, midazolam, MUSCLE RUB, ondansetron **OR** ondansetron (ZOFRAN) IV, [START ON 08/06/2014] sodium chloride  BP 129/57 mmHg  Pulse 82  Temp(Src) 99 F (37.2 C) (Oral)  Resp 18  Ht 4\' 10"  (1.473 m)  Wt 100.699 kg (222 lb)  BMI 46.41 kg/m2  SpO2 100%   PPS: 10%   Intake/Output Summary (Last 24 hours) at  08/04/14 1438 Last data filed at 08/04/14 1100  Gross per 24 hour  Intake 4095.77 ml  Output   1845 ml  Net 2250.77 ml       Physical Exam:  General: Sedated on vent, obese, NAD HEENT: [place]/AT, ETT, moist mucous membranes Chest: No labored breathing, symmetric, mechanical ventilated CVS: RRR Abdomen: Soft, NT, ND, +BS Ext: Bilat BKA, warm to touch Neuro: Sedated, unable to follow commands   Labs: CBC    Component Value Date/Time   WBC 16.4* 08/04/2014 0430   RBC 3.36* 08/04/2014 0430   HGB 8.2* 08/04/2014 0430   HCT 27.0* 08/04/2014 0430   PLT 276 08/04/2014 0430   MCV 80.4 08/04/2014 0430   MCH 24.4* 08/04/2014 0430   MCHC 30.4 08/04/2014 0430   RDW 18.5* 08/04/2014 0430   LYMPHSABS 2.2 08/04/2014 0430   MONOABS 1.6* 08/04/2014 0430   EOSABS 0.5 08/04/2014 0430   BASOSABS 0.0 08/04/2014 0430    BMET    Component Value Date/Time   NA 128* 08/04/2014 0430   K 3.6 08/04/2014 0430   CL 95* 08/04/2014 0430   CO2 26 08/04/2014 0430   GLUCOSE 149* 08/04/2014 0430   BUN 33* 08/04/2014 0430   CREATININE 1.19* 08/04/2014 0430   CALCIUM 7.3* 08/04/2014 0430   GFRNONAA 52* 08/04/2014 0430   GFRAA 60* 08/04/2014 0430    CMP     Component Value Date/Time   NA 128* 08/04/2014 0430   K 3.6  08/04/2014 0430   CL 95* 08/04/2014 0430   CO2 26 08/04/2014 0430   GLUCOSE 149* 08/04/2014 0430   BUN 33* 08/04/2014 0430   CREATININE 1.19* 08/04/2014 0430   CALCIUM 7.3* 08/04/2014 0430   PROT 5.2* 07/27/2014 0323   ALBUMIN 1.3* 07/27/2014 0323   AST 12 07/27/2014 0323   ALT 16 07/27/2014 0323   ALKPHOS 66 07/27/2014 0323   BILITOT 0.6 07/27/2014 0323   GFRNONAA 52* 08/04/2014 0430   GFRAA 60* 08/04/2014 0430    Assessment and Plan: 1. Code Status: NO CPR/shock. Continue ventilator and vasopressors/ antiarrhythmics.  2. Symptom Control: 1. Anxiety/Agitation: Continue prn versed and haldol for comfort while intubated. Onalee Hua says that she scares easily so explaining and  talking to [place] may help with agitation.  2. Decreased appetite: Continue temporary artificial feeding for now.  3. Weakness: Continue medical management.  4. Constipation: Dulcolax supp daily prn.  3. Psycho/Social: Emotional support provided to patient and family at bedside.  4. Disposition: To be determined.    Time In Time Out Total Time Spent with Patient Total Overall Time  1150 1210     Greater than 50%  of this time was spent counseling and coordinating care related to the above assessment and plan.   Yong Channel, NP Palliative Medicine Team Pager # [phone] (M-F 8a-5p) Team Phone # [phone] (Nights/Weekends)
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
## 2014-08-04 NOTE — Progress Notes (Signed)
[facility]       Subjective: Intubated restrained, more sedated today  Antibiotics:  Anti-infectives    Start     Dose/Rate Route Frequency Ordered Stop   08/04/14 2200  ceFEPIme (MAXIPIME) 1 g in dextrose 5 % 50 mL IVPB     1 g 100 mL/hr over 30 Minutes Intravenous Every 12 hours 08/04/14 1423     08/03/14 1100  vancomycin (VANCOCIN) IVPB 750 mg/150 ml premix     750 mg 150 mL/hr over 60 Minutes Intravenous Every 36 hours 08/03/14 1044     08/03/14 1045  ceFEPIme (MAXIPIME) 1 g in dextrose 5 % 50 mL IVPB  Status:  Discontinued     1 g 100 mL/hr over 30 Minutes Intravenous Every 24 hours 08/03/14 1037 08/04/14 1423   07/31/14 1515  ceFAZolin (ANCEF) IVPB 2 g/50 mL premix  Status:  Discontinued     2 g 100 mL/hr over 30 Minutes Intravenous 3 times per day 07/31/14 1510 08/03/14 1028   07/28/14 2200  levofloxacin (LEVAQUIN) IVPB 750 mg  Status:  Discontinued     750 mg 100 mL/hr over 90 Minutes Intravenous Every 24 hours 07/28/14 2140 07/31/14 1433   07/26/14 0600  vancomycin (VANCOCIN) IVPB 750 mg/150 ml premix  Status:  Discontinued     750 mg 75 mL/hr over 120 Minutes Intravenous Every 12 hours 07/26/14 0545 07/30/14 2016   07/26/14 0200  levofloxacin (LEVAQUIN) IVPB 750 mg  Status:  Discontinued     750 mg 100 mL/hr over 90 Minutes Intravenous Every 24 hours 07/25/14 0704 07/27/14 1152   07/25/14 0800  aztreonam (AZACTAM) 2 g in dextrose 5 % 50 mL IVPB  Status:  Discontinued     2 g 100 mL/hr over 30 Minutes Intravenous Every 8 hours 07/25/14 0705 07/26/14 1723   07/25/14 0000  levofloxacin (LEVAQUIN) IVPB 750 mg     750 mg 100 mL/hr over 90 Minutes Intravenous  Once 2014-07-27 2349 07/25/14 0325   07/25/14 0000  aztreonam (AZACTAM) 2 g in dextrose 5 % 50 mL IVPB     2 g 100 mL/hr over 30 Minutes Intravenous  Once 07-27-14 2349 07/25/14 0146   07/25/14 0000  vancomycin (VANCOCIN) IVPB 1000 mg/200 mL premix     1,000 mg 200 mL/hr over 60  Minutes Intravenous  Once 27-Jul-2014 2349 07/25/14 0435   07/25/14 0000  levofloxacin (LEVAQUIN) IVPB 750 mg  Status:  Discontinued     750 mg 100 mL/hr over 90 Minutes Intravenous  Once 07-27-14 2352 07-27-2014 2354   07/25/14 0000  aztreonam (AZACTAM) 2 g in dextrose 5 % 50 mL IVPB  Status:  Discontinued     2 g 100 mL/hr over 30 Minutes Intravenous  Once Jul 27, 2014 2352 07-27-2014 2354   07/25/14 0000  vancomycin (VANCOCIN) IVPB 1000 mg/200 mL premix  Status:  Discontinued     1,000 mg 200 mL/hr over 60 Minutes Intravenous  Once 07-27-2014 2352 2014-07-27 2354      Medications: Scheduled Meds: . antiseptic oral rinse  7 mL Mouth Rinse QID  . ceFEPime (MAXIPIME) IV  1 g Intravenous Q12H  . chlorhexidine  15 mL Mouth Rinse BID  . enoxaparin (LOVENOX) injection  40 mg Subcutaneous Q24H  . famotidine  20 mg Per Tube Daily  . feeding supplement (VITAL HIGH PROTEIN)  1,000 mL Per Tube Q24H  . fentaNYL  50 mcg Intravenous Once  . haloperidol lactate  4 mg Intravenous 3  times per day  . sodium chloride  10 mL Intravenous Q12H  . vancomycin  750 mg Intravenous Q36H   Continuous Infusions: . sodium chloride 250 mL/hr at 08/04/14 0400  . fentaNYL infusion INTRAVENOUS 175 mcg/hr (08/04/14 1225)  . norepinephrine (LEVOPHED) Adult infusion 10 mcg/min (08/04/14 0941)  . phenylephrine (NEO-SYNEPHRINE) Adult infusion 150 mcg/min (08/04/14 1600)   PRN Meds:.Place/Maintain arterial line **AND** sodium chloride, bisacodyl, fentaNYL, fentaNYL, midazolam, MUSCLE RUB, ondansetron **OR** ondansetron (ZOFRAN) IV, [START ON 08/06/2014] sodium chloride    Objective: Weight change: 14 lb (6.35 kg)  Intake/Output Summary (Last 24 hours) at 08/04/14 1948 Last data filed at 08/04/14 1900  Gross per 24 hour  Intake 3848.18 ml  Output   2045 ml  Net 1803.18 ml   Blood pressure 121/49, pulse 81, temperature 99.2 F (37.3 C), temperature source Oral, resp. rate 23, height 4\' 10"  (1.473 m), weight 222 lb (100.699  kg), SpO2 97 %. Temp:  [98.3 F (36.8 C)-99.3 F (37.4 C)] 99.2 F (37.3 C) (02/12 1618) Pulse Rate:  [69-111] 81 (02/12 1920) Resp:  [15-35] 23 (02/12 1920) BP: (85-149)/(33-81) 121/49 mmHg (02/12 1920) SpO2:  [97 %-100 %] 97 % (02/12 1920) FiO2 (%):  [40 %-80 %] 40 % (02/12 1920) Weight:  [222 lb (100.699 kg)] 222 lb (100.699 kg) (02/12 0415)  Physical Exam: General: Intubated sedated  Cardiovascular regular rate, no murmurs Pulmonary: Improved aeration on exam  Abdomen obese  Skin patient has left IJ in place Neuro qudriplegic CBC: CBC Latest Ref Rng 08/04/2014 08/03/2014 08/01/2014  WBC 4.0 - 10.5 K/uL 16.4(H) 21.5(H) 10.7(H)  Hemoglobin 12.0 - 15.0 g/dL 8.2(L) 9.9(L) 8.9(L)  Hematocrit 36.0 - 46.0 % 27.0(L) 32.3(L) 28.9(L)  Platelets 150 - 400 K/uL 276 414(H) 374      BMET  Recent Labs  08/03/14 0430 08/04/14 0430  NA 129* 128*  K 2.7* 3.6  CL 93* 95*  CO2 27 26  GLUCOSE 204* 149*  BUN 20 33*  CREATININE 1.24* 1.19*  CALCIUM 7.4* 7.3*     Liver Panel  No results for input(s): PROT, ALBUMIN, AST, ALT, ALKPHOS, BILITOT, BILIDIR, IBILI in the last 72 hours.     Sedimentation Rate No results for input(s): ESRSEDRATE in the last 72 hours. C-Reactive Protein No results for input(s): CRP in the last 72 hours.  Micro Results: Recent Results (from the past 720 hour(s))  Blood Culture (routine x 2)     Status: None   Collection Time: 07/25/14  1:00 AM  Result Value Ref Range Status   Specimen Description BLOOD RIGHT FEMORAL ARTERY  Final   Special Requests BOTTLES DRAWN AEROBIC AND ANAEROBIC 5CC EA  Final   Culture   Final    STAPHYLOCOCCUS AUREUS Note: RIFAMPIN AND GENTAMICIN SHOULD NOT BE USED AS SINGLE DRUGS FOR TREATMENT OF STAPH INFECTIONS. Note: Gram Stain Report Called to,Read Back By and Verified With: CELENIE SOSA 07/26/14 5:30AM THOMI/ORTCH Performed at [facility]    Report Status 07/28/2014 FINAL  Final   Organism ID, Bacteria  STAPHYLOCOCCUS AUREUS  Final      Susceptibility   Staphylococcus aureus - MIC*    CLINDAMYCIN <=0.25 SENSITIVE Sensitive     ERYTHROMYCIN <=0.25 SENSITIVE Sensitive     GENTAMICIN <=0.5 SENSITIVE Sensitive     LEVOFLOXACIN 0.25 SENSITIVE Sensitive     OXACILLIN 0.5 SENSITIVE Sensitive     PENICILLIN >=0.5 RESISTANT Resistant     RIFAMPIN <=0.5 SENSITIVE Sensitive     TRIMETH/SULFA <=10 SENSITIVE Sensitive  VANCOMYCIN 1 SENSITIVE Sensitive     TETRACYCLINE <=1 SENSITIVE Sensitive     MOXIFLOXACIN <=0.25 SENSITIVE Sensitive     * STAPHYLOCOCCUS AUREUS  Blood Culture (routine x 2)     Status: None   Collection Time: 07/25/14  1:00 AM  Result Value Ref Range Status   Specimen Description BLOOD RIGHT ARM  Final   Special Requests BOTTLES DRAWN AEROBIC ONLY 2CC  Final   Culture   Final    NO GROWTH 5 DAYS Note: Culture results may be compromised due to an inadequate volume of blood received in culture bottles. Performed at [facility]    Report Status 07/31/2014 FINAL  Final  Urine culture     Status: None   Collection Time: 07/25/14  1:27 AM  Result Value Ref Range Status   Specimen Description URINE, CATHETERIZED  Final   Special Requests NONE  Final   Colony Count   Final    80,000 COLONIES/ML Performed at [facility]    Culture   Final    PROTEUS MIRABILIS Performed at [facility]    Report Status 07/27/2014 FINAL  Final   Organism ID, Bacteria PROTEUS MIRABILIS  Final      Susceptibility   Proteus mirabilis - MIC*    AMPICILLIN <=2 SENSITIVE Sensitive     CEFAZOLIN 8 SENSITIVE Sensitive     CEFTRIAXONE <=1 SENSITIVE Sensitive     CIPROFLOXACIN >=4 RESISTANT Resistant     GENTAMICIN 8 INTERMEDIATE Intermediate     LEVOFLOXACIN >=8 RESISTANT Resistant     NITROFURANTOIN 128 RESISTANT Resistant     TOBRAMYCIN 8 INTERMEDIATE Intermediate     TRIMETH/SULFA >=320 RESISTANT Resistant     PIP/TAZO <=4 SENSITIVE Sensitive     * PROTEUS  MIRABILIS  Respiratory virus panel (routine influenza)     Status: None   Collection Time: 07/25/14 12:13 PM  Result Value Ref Range Status   Respiratory Syncytial Virus A Negative Negative Final   Respiratory Syncytial Virus B Negative Negative Final   Influenza A Negative Negative Final   Influenza B Negative Negative Final   Parainfluenza 1 Negative Negative Final   Parainfluenza 2 Negative Negative Final   Parainfluenza 3 Negative Negative Final   Metapneumovirus Negative Negative Final   Rhinovirus Negative Negative Final   Adenovirus Negative Negative Final    Comment: (NOTE) Performed At: [facility] [address] [place], [place] [id number] Mila Homer MD JX:[id number]   MRSA PCR Screening     Status: None   Collection Time: 07/25/14  2:59 PM  Result Value Ref Range Status   MRSA by PCR NEGATIVE NEGATIVE Final    Comment:        The GeneXpert MRSA Assay (FDA approved for NASAL specimens only), is one component of a comprehensive MRSA colonization surveillance program. It is not intended to diagnose MRSA infection nor to guide or monitor treatment for MRSA infections.   Culture, blood (routine x 2)     Status: None   Collection Time: 07/28/14  3:40 PM  Result Value Ref Range Status   Specimen Description BLOOD NECK LEFT  Final   Special Requests BOTTLES DRAWN AEROBIC AND ANAEROBIC 10CC  Final   Culture   Final    NO GROWTH 5 DAYS Performed at [facility]    Report Status 08/03/2014 FINAL  Final  Culture, blood (routine x 2)     Status: None   Collection Time: 07/28/14  7:26 PM  Result Value Ref Range Status   Specimen Description BLOOD RIGHT HAND  Final   Special Requests BOTTLES DRAWN AEROBIC ONLY 3CC  Final   Culture   Final    NO GROWTH 5 DAYS [facility]    Report Status 08/04/2014 FINAL  Final  Culture, respiratory (NON-Expectorated)     Status: None   Collection Time: 07/28/14  9:15 PM  Result Value Ref  Range Status   Specimen Description TRACHEAL ASPIRATE  Final   Special Requests NONE  Final   Gram Stain   Final    NO WBC SEEN NO SQUAMOUS EPITHELIAL CELLS SEEN NO ORGANISMS SEEN [facility]    Culture   Final    NO GROWTH 2 DAYS [facility]    Report Status 07/31/2014 FINAL  Final  Culture, respiratory (NON-Expectorated)     Status: None (Preliminary result)   Collection Time: 08/02/14  5:41 PM  Result Value Ref Range Status   Specimen Description TRACHEAL ASPIRATE  Final   Special Requests Normal  Final   Gram Stain   Final    MODERATE WBC PRESENT,BOTH PMN AND MONONUCLEAR RARE SQUAMOUS EPITHELIAL CELLS PRESENT NO ORGANISMS SEEN [facility]    Culture   Final    RARE CANDIDA ALBICANS [facility]formed at [facility]    Report Status PENDING  Incomplete  Culture, blood (routine x 2)     Status: None (Preliminary result)   Collection Time: 08/03/14 10:45 AM  Result Value Ref Range Status   Specimen Description BLOOD RIGHT HAND  Final   Special Requests BOTTLES DRAWN AEROBIC ONLY 5CC  Final   Culture   Final           BLOOD CULTURE RECEIVED NO GROWTH TO DATE CULTURE WILL BE HELD FOR 5 DAYS BEFORE ISSUING A FINAL NEGATIVE REPORT [facility]    Report Status PENDING  Incomplete  Culture, Urine     Status: None   Collection Time: 08/03/14 10:54 AM  Result Value Ref Range Status   Specimen Description URINE, RANDOM  Final   Special Requests Normal  Final   Colony Count   Final    >=100,000 COLONIES/ML [facility]    Culture YEAST [facility] at [facility]   Final   Report Status 08/04/2014 FINAL  Final  Culture, blood (routine x 2)     Status: None (Preliminary result)   Collection Time: 08/03/14 10:55 AM  Result Value Ref Range Status   Specimen Description BLOOD RIGHT ARM  Final   Special Requests BOTTLES DRAWN AEROBIC ONLY 2.5CC  Final    Culture   Final           BLOOD CULTURE RECEIVED NO GROWTH TO DATE CULTURE WILL BE HELD FOR 5 DAYS BEFORE ISSUING A FINAL NEGATIVE REPORT [facility]formed [facility]    Report Status PENDING  Incomplete    Studies/Results: Dg Chest Port 1 View  08/04/2014   CLINICAL DATA:  Respiratory failure.  Assess endotracheal tube.  EXAM: PORTABLE CHEST - 1 VIEW  COMPARISON:  08/03/2014  FINDINGS: Endotracheal tube is 3.7 cm above the carina. Again noted are very low lung volumes. Marked elevation of the right hemidiaphragm. Streaky densities in both lungs are suggestive for atelectasis and minimally changed. Cardiac silhouette is poorly visualized due to the low lung volumes. Central line tip in the lower SVC region. Negative for a  pneumothorax. Nasogastric tube extends into the abdomen.  IMPRESSION: Persistent low lung volumes with elevation of the right hemidiaphragm. Minimal change from the prior examination.  Prominent interstitial lung densities, left side greater than right. Differential would include atelectasis versus mild edema.  Support apparatuses as described.   Electronically Signed   By: Richarda Overlie M.D.   On: 08/04/2014 07:17   Dg Chest Port 1 View  08/03/2014   CLINICAL DATA:  Shortness of breath, intubated patient.  EXAM: PORTABLE CHEST - 1 VIEW  COMPARISON:  Portable chest x-ray of February 10th and July 31, 2014  FINDINGS: There is chronic volume loss and increased retrocardiac density on the left. The hemidiaphragm remains obscured. On the right there is persistent hypoinflation. Increased right perihilar density on the right has developed. The cardiac silhouette where visualized is mildly enlarged. The central pulmonary vascularity is engorged.  The endotracheal tube tip projects approximately 3.8 cm above the crotch of the carina. The esophagogastric tube tip projects below the inferior margin of the image. The left internal jugular venous catheter tip projects over the midportion of  the SVC.  IMPRESSION: Chronic hypoinflation and interstitial edema and lower lobe atelectasis on the left. Increasing right perihilar density consistent with subsegmental atelectasis. The support tubes and lines are in appropriate position.   Electronically Signed   By: David  [place]   On: 08/03/2014 07:30      Assessment/Plan:  Active Problems:   Sepsis   Sepsis associated hypotension   Sepsis due to urinary tract infection   Essential hypertension   Moderate malnutrition   Anemia of chronic disease   Protein-calorie malnutrition, severe   Staphylococcus aureus bacteremia with sepsis   Discitis of thoracic region   Acute respiratory failure   Acute encephalopathy   Severe sepsis   Bacteremia due to Staphylococcus aureus   Acute on chronic respiratory failure with hypoxia   Midline thoracic back pain   Sacral decubitus ulcer   Chronic diastolic CHF (congestive heart failure)   Normocytic anemia   Chronic indwelling Foley catheter   Paraplegia   S/P BKA (below knee amputation) bilateral   Moderate protein-calorie malnutrition   Acute respiratory failure with hypoxia   Encephalopathy acute   Palliative care by specialist   DNR (do not resuscitate)   Diskitis   HCAP (healthcare-associated pneumonia)   Palliative care encounter   Weakness generalized   Decreased appetite    Zan A Sze is a 54 y.o. female with  . female with Paralysis, admitted with sepsis found to have methicillin sensitive Staphylococcus aureus bacteremia, diskitis on plain films,  who required intubation remains on the ventilator now with fevers  #1 New onset fevers worsening infiltrates on chest x-ray:  worrisome for healthcare associated pneumonia  I sent cultures yesterday from tracheal aspirate which only grew candida  I would like to go back to more narrow antibiotics if her septic picture improves  #2 Yeast in urine:  --DC foley and place new one to treat  this     #3    [facility] Staphylococcus aureus bacteremia   Staphylococcus aureus bacteremia (SAB) is associated with a high rate of complications and mortality. Specific aspects of clinical management are critical to optimizing the outcome of patients with SAB. Therefore, the [facility] Antimicrobial Management Team [name]) has initiated an intervention aimed at improving the management of SAB at [facility]. To do so, Infectious Diseases physicians are providing an evidence-based consult for the management of  all patients with SAB.     Yes No Comments  Perform follow-up blood cultures (even if the patient is afebrile) to ensure clearance of bacteremia [X]  [ ]   blood cultures being drawn pm 07/28/14  day that she had new IJ placed , NGTD  Remove vascular catheter and obtain follow-up blood cultures after the removal of the catheter [ ]  [ ]  She had IJ placed before I would have preferred, may want to consider DC it certainly if her cultures from the fifth are positive he will need to come out   Perform echocardiography to evaluate for endocarditis (transthoracic ECHO is 40-50% sensitive, TEE is > 90% sensitive) [ ]  [ ]  Please keep in mind, that neither test can definitively EXCLUDE endocarditis, and that should clinical suspicion remain high for endocarditis the patient should then still be treated with an "endocarditis" duration of therapy = 6 weeks ordered  GREATLY APPRECIATE CARDIOLOGY PERFORMING TEE WHILE SHE IS ON THE VENTILATOR       Ensure source control [ ]  [ ]  Have all abscesses been drained effectively? Have deep seeded infections (septic joints or osteomyelitis) had appropriate surgical debridement?  Not clear what source is pneumonia? Her back  Investigate for "metastatic" sites of infection [ ]  [ ]  Does the patient have ANY symptom or physical exam finding that would suggest a deeper  infection (back or neck pain that may be suggestive of vertebral osteomyelitis or epidural abscess, muscle pain that could be a symptom of pyomyositis)?  Keep in mind that for deep seeded infections MRI imaging with contrast is preferred rather than other often insensitive tests such as plain x-rays, especially early in a patient's presentation.   GIVEN HER SIGNIFICANT BACK PAIN AND FINDINGS ON PLAIN FILM OF CHEST I WOULD RECOMMEND EITHER MRI T SPINE WHEN ABLE  Change antibiotic therapy to ANCEF -AFTER we de-escalate her HCAP Coverage [ ]  [ ]  Beta-lactam antibiotics are preferred for MSSA due to higher cure rates.  If on Vancomycin, goal trough should be 15 - 20 mcg/mL  Estimated duration of IV antibiotic therapy: 8 weeks esp if she has diskitis [ ]  [ ]  Consult case management for probably prolonged outpatient IV antibiotic therapy        LOS: 10 days   Acey Lav 08/04/2014, 7:48 PM

## 2014-08-04 NOTE — Progress Notes (Signed)
[facility] FOR AM LAB REPLACEMENT ONLY  The patient does apply for the [facility] Electrolyte Replacment Protocol based on the criteria listed below:   1. Is GFR >/= 40 ml/min? Yes.    Patient's GFR today is 60 2. Is urine output >/= 0.5 ml/kg/hr for the last 6 hours? Yes.   Patient's UOP is 0.78 ml/kg/hr 3. Is BUN < 60 mg/dL? Yes.    Patient's BUN today is 33 4. Abnormal electrolyte(s): K 3.6 5. Ordered repletion with: [facility] adult ICU replacement protocol 6. If a panic level lab has been reported, has the CCM MD in charge been notified? Yes.  .   Physician:  Dr. Levy Pupaobert Byrum  [facility], [name] 08/04/2014 6:48 AM

## 2014-08-04 NOTE — Progress Notes (Addendum)
ANTIBIOTIC CONSULT NOTE - FOLLOW UP  Pharmacy Consult:  Vancomycin / Cefepime Indication:  VAP  Allergies  Allergen Reactions  . Iodine-131 Hives  . Vancomycin Nausea And Vomiting    Note from [name]: Tolerated vancomycin run in over 2 hours  . Zosyn [Piperacillin Sod-Tazobactam So] Swelling    Tolerated ceftriaxone  . Iohexol Rash    Patient Measurements: Height:  (147.3 cm) Weight: 222 lb (100.699 kg) IBW/kg (Calculated) : 40.9  Vital Signs: Temp: 99 F (37.2 C) (02/12 1219) Temp Source: Oral (02/12 1219) BP: 119/66 mmHg (02/12 1200) Pulse Rate: 83 (02/12 1200) Intake/Output from previous day: 02/11 0701 - 02/12 0700 In: 5566.1 [I.V.:3482.1; RU/EA:5409; IV Piggyback:700] Out: 2545 [Urine:2545] Intake/Output from this shift: Total I/O In: 309.4 [I.V.:179.4; NG/GT:130] Out: 400 [Urine:400]  Labs:  Recent Labs  08/03/14 0430 08/04/14 0430  WBC 21.5* 16.4*  HGB 9.9* 8.2*  PLT 414* 276  CREATININE 1.24* 1.19*   Estimated Creatinine Clearance: 56.6 mL/min (by C-G formula based on Cr of 1.19). No results for input(s): VANCOTROUGH, VANCOPEAK, VANCORANDOM, GENTTROUGH, GENTPEAK, GENTRANDOM, TOBRATROUGH, TOBRAPEAK, TOBRARND, AMIKACINPEAK, AMIKACINTROU, AMIKACIN in the last 72 hours.   Microbiology: Recent Results (from the past 720 hour(s))  Blood Culture (routine x 2)     Status: None   Collection Time: 07/25/14  1:00 AM  Result Value Ref Range Status   Specimen Description BLOOD RIGHT FEMORAL ARTERY  Final   Special Requests BOTTLES DRAWN AEROBIC AND ANAEROBIC 5CC EA  Final   Culture   Final    STAPHYLOCOCCUS AUREUS Note: RIFAMPIN AND GENTAMICIN SHOULD NOT BE USED AS SINGLE DRUGS FOR TREATMENT OF STAPH INFECTIONS. Note: Gram Stain Report Called to,Read Back By and Verified With: CELENIE SOSA 07/26/14 5:30AM THOMI/ORTCH Performed at [facility]    Report Status 07/28/2014 FINAL  Final   Organism ID, Bacteria STAPHYLOCOCCUS AUREUS  Final   Susceptibility   Staphylococcus aureus - MIC*    CLINDAMYCIN <=0.25 SENSITIVE Sensitive     ERYTHROMYCIN <=0.25 SENSITIVE Sensitive     GENTAMICIN <=0.5 SENSITIVE Sensitive     LEVOFLOXACIN 0.25 SENSITIVE Sensitive     OXACILLIN 0.5 SENSITIVE Sensitive     PENICILLIN >=0.5 RESISTANT Resistant     RIFAMPIN <=0.5 SENSITIVE Sensitive     TRIMETH/SULFA <=10 SENSITIVE Sensitive     VANCOMYCIN 1 SENSITIVE Sensitive     TETRACYCLINE <=1 SENSITIVE Sensitive     MOXIFLOXACIN <=0.25 SENSITIVE Sensitive     * STAPHYLOCOCCUS AUREUS  Blood Culture (routine x 2)     Status: None   Collection Time: 07/25/14  1:00 AM  Result Value Ref Range Status   Specimen Description BLOOD RIGHT ARM  Final   Special Requests BOTTLES DRAWN AEROBIC ONLY 2CC  Final   Culture   Final    NO GROWTH 5 DAYS Note: Culture results may be compromised due to an inadequate volume of blood received in culture bottles. Performed at [facility]    Report Status 07/31/2014 FINAL  Final  Urine culture     Status: None   Collection Time: 07/25/14  1:27 AM  Result Value Ref Range Status   Specimen Description URINE, CATHETERIZED  Final   Special Requests NONE  Final   Colony Count   Final    80,000 COLONIES/ML Performed at [facility]    Culture   Final    PROTEUS MIRABILIS Performed at [facility]    Report Status 07/27/2014 FINAL  Final   Organism ID,  Bacteria PROTEUS MIRABILIS  Final      Susceptibility   Proteus mirabilis - MIC*    AMPICILLIN <=2 SENSITIVE Sensitive     CEFAZOLIN 8 SENSITIVE Sensitive     CEFTRIAXONE <=1 SENSITIVE Sensitive     CIPROFLOXACIN >=4 RESISTANT Resistant     GENTAMICIN 8 INTERMEDIATE Intermediate     LEVOFLOXACIN >=8 RESISTANT Resistant     NITROFURANTOIN 128 RESISTANT Resistant     TOBRAMYCIN 8 INTERMEDIATE Intermediate     TRIMETH/SULFA >=320 RESISTANT Resistant     PIP/TAZO <=4 SENSITIVE Sensitive     * PROTEUS MIRABILIS  Respiratory virus panel  (routine influenza)     Status: None   Collection Time: 07/25/14 12:13 PM  Result Value Ref Range Status   Respiratory Syncytial Virus A Negative Negative Final   Respiratory Syncytial Virus B Negative Negative Final   Influenza A Negative Negative Final   Influenza B Negative Negative Final   Parainfluenza 1 Negative Negative Final   Parainfluenza 2 Negative Negative Final   Parainfluenza 3 Negative Negative Final   Metapneumovirus Negative Negative Final   Rhinovirus Negative Negative Final   Adenovirus Negative Negative Final    Comment: (NOTE) Performed At: [facility] [address] [place], [place] [id number] Mila HomerHancock William F MD NW:[phone]   MRSA PCR Screening     Status: None   Collection Time: 07/25/14  2:59 PM  Result Value Ref Range Status   MRSA by PCR NEGATIVE NEGATIVE Final    Comment:        The GeneXpert MRSA Assay (FDA approved for NASAL specimens only), is one component of a comprehensive MRSA colonization surveillance program. It is not intended to diagnose MRSA infection nor to guide or monitor treatment for MRSA infections.   Culture, blood (routine x 2)     Status: None   Collection Time: 07/28/14  3:40 PM  Result Value Ref Range Status   Specimen Description BLOOD NECK LEFT  Final   Special Requests BOTTLES DRAWN AEROBIC AND ANAEROBIC 10CC  Final   Culture   Final    NO GROWTH 5 DAYS [facility]formed [facility]    Report Status 08/03/2014 FINAL  Final  Culture, blood (routine x 2)     Status: None   Collection Time: 07/28/14  7:26 PM  Result Value Ref Range Status   Specimen Description BLOOD RIGHT HAND  Final   Special Requests BOTTLES DRAWN AEROBIC ONLY 3CC  Final   Culture   Final    NO GROWTH 5 DAYS [facility]formed at [facility]    Report Status 08/04/2014 FINAL  Final  Culture, respiratory (NON-Expectorated)     Status: None   Collection Time: 07/28/14  9:15 PM  Result Value Ref Range Status   Specimen  Description TRACHEAL ASPIRATE  Final   Special Requests NONE  Final   Gram Stain   Final    NO WBC SEEN NO SQUAMOUS EPITHELIAL CELLS SEEN NO ORGANISMS SEEN [facility]    Culture   Final    NO GROWTH 2 DAYS Performed at [facility]    Report Status 07/31/2014 FINAL  Final  Culture, respiratory (NON-Expectorated)     Status: None (Preliminary result)   Collection Time: 08/02/14  5:41 PM  Result Value Ref Range Status   Specimen Description TRACHEAL ASPIRATE  Final   Special Requests Normal  Final   Gram Stain   Final    MODERATE WBC PRESENT,BOTH PMN AND  MONONUCLEAR RARE SQUAMOUS EPITHELIAL CELLS PRESENT NO ORGANISMS SEEN Performed at [facility]    Culture   Final    RARE CANDIDA ALBICANS Performed at [facility]    Report Status PENDING  Incomplete  Culture, blood (routine x 2)     Status: None (Preliminary result)   Collection Time: 08/03/14 10:45 AM  Result Value Ref Range Status   Specimen Description BLOOD RIGHT HAND  Final   Special Requests BOTTLES DRAWN AEROBIC ONLY 5CC  Final   Culture   Final           BLOOD CULTURE RECEIVED NO GROWTH TO DATE CULTURE WILL BE HELD FOR 5 DAYS BEFORE ISSUING A FINAL NEGATIVE REPORT Performed at [facility]    Report Status PENDING  Incomplete  Culture, Urine     Status: None   Collection Time: 08/03/14 10:54 AM  Result Value Ref Range Status   Specimen Description URINE, RANDOM  Final   Special Requests Normal  Final   Colony Count   Final    >=100,000 COLONIES/ML Performed at [facility]    Culture YEAST Performed at [facility]   Final   Report Status 08/04/2014 FINAL  Final  Culture, blood (routine x 2)     Status: None (Preliminary result)   Collection Time: 08/03/14 10:55 AM  Result Value Ref Range Status   Specimen Description BLOOD RIGHT ARM  Final   Special Requests BOTTLES DRAWN AEROBIC ONLY 2.5CC  Final   Culture   Final            BLOOD CULTURE RECEIVED NO GROWTH TO DATE CULTURE WILL BE HELD FOR 5 DAYS BEFORE ISSUING A FINAL NEGATIVE REPORT Performed at [facility]    Report Status PENDING  Incomplete      Assessment: 54 YOF on vancomycin and cefepime for MSSA PNA (concerning for VAP).  Her renal function is improving.     CC:  frequent UTIs and chronic catheter, discharged 1/16, c/o SOB has gotten worse over 2d, was told by PCP that UTI was recurring, had catheter changed something this week, concern for sepsis, to begin IV ABX.  Vanc 2/2 >> 2/8; restart 2/11 >> Azactam 2/2 >> 2/3 LVQ 2/2>2/4; restart 2/5>>2/8 Ancef 2/8 >> 2/11 Cefepime 2/11 >>  2/7 VT = 54.3 on 750 q12 (SCr 1.04). Est VL on 2/11 likely 13.5 (t1/2 ~43 hrs)  2/2 Urine -80k Proteus - resistant to LVQ 2/2 Blood - MSSA in 1 of 2 - sens oxacillin  2/5 Blood - negative 2/2 RVP - NEG 2/5 TA - NGTD 2/10 TA - C.albicans 2/11 BCx x2 - NGTD   Goal of Therapy:  Vancomycin trough level 15-20 mcg/ml   Plan:  - Vanc  IV Q36H - Change cefepime to 1gm IV Q12H - Monitor renal fxn, clinical progress, vanc trough at [facility]    Nathanael Krist D. Laney Potash, PharmD, BCPS Pager:  [phone] 08/04/2014, 2:16 PM

## 2014-08-05 ENCOUNTER — Inpatient Hospital Stay (HOSPITAL_COMMUNITY): Payer: BLUE CROSS/BLUE SHIELD

## 2014-08-05 DIAGNOSIS — A4101 Sepsis due to Methicillin susceptible Staphylococcus aureus: Principal | ICD-10-CM

## 2014-08-05 DIAGNOSIS — R6521 Severe sepsis with septic shock: Secondary | ICD-10-CM

## 2014-08-05 LAB — CBC WITH DIFFERENTIAL/PLATELET
BASOS PCT: 0 % (ref 0–1)
Basophils Absolute: 0 10*3/uL (ref 0.0–0.1)
EOS ABS: 0.4 10*3/uL (ref 0.0–0.7)
EOS PCT: 4 % (ref 0–5)
HCT: 25.8 % — ABNORMAL LOW (ref 36.0–46.0)
Hemoglobin: 7.5 g/dL — ABNORMAL LOW (ref 12.0–15.0)
LYMPHS ABS: 1.3 10*3/uL (ref 0.7–4.0)
Lymphocytes Relative: 11 % — ABNORMAL LOW (ref 12–46)
MCH: 23.6 pg — AB (ref 26.0–34.0)
MCHC: 29.1 g/dL — ABNORMAL LOW (ref 30.0–36.0)
MCV: 81.1 fL (ref 78.0–100.0)
Monocytes Absolute: 1.4 10*3/uL — ABNORMAL HIGH (ref 0.1–1.0)
Monocytes Relative: 12 % (ref 3–12)
Neutro Abs: 8.6 10*3/uL — ABNORMAL HIGH (ref 1.7–7.7)
Neutrophils Relative %: 73 % (ref 43–77)
Platelets: 278 10*3/uL (ref 150–400)
RBC: 3.18 MIL/uL — ABNORMAL LOW (ref 3.87–5.11)
RDW: 18.4 % — AB (ref 11.5–15.5)
WBC: 11.7 10*3/uL — ABNORMAL HIGH (ref 4.0–10.5)

## 2014-08-05 LAB — CBC
HCT: 26.5 % — ABNORMAL LOW (ref 36.0–46.0)
Hemoglobin: 7.7 g/dL — ABNORMAL LOW (ref 12.0–15.0)
MCH: 24.1 pg — AB (ref 26.0–34.0)
MCHC: 29.1 g/dL — ABNORMAL LOW (ref 30.0–36.0)
MCV: 83.1 fL (ref 78.0–100.0)
Platelets: 279 10*3/uL (ref 150–400)
RBC: 3.19 MIL/uL — ABNORMAL LOW (ref 3.87–5.11)
RDW: 18.5 % — ABNORMAL HIGH (ref 11.5–15.5)
WBC: 10.3 10*3/uL (ref 4.0–10.5)

## 2014-08-05 LAB — BASIC METABOLIC PANEL
Anion gap: 4 — ABNORMAL LOW (ref 5–15)
BUN: 43 mg/dL — AB (ref 6–23)
CALCIUM: 7.9 mg/dL — AB (ref 8.4–10.5)
CO2: 27 mmol/L (ref 19–32)
Chloride: 99 mmol/L (ref 96–112)
Creatinine, Ser: 1.13 mg/dL — ABNORMAL HIGH (ref 0.50–1.10)
GFR calc Af Amer: 64 mL/min — ABNORMAL LOW (ref >90)
GFR, EST NON AFRICAN AMERICAN: 55 mL/min — AB (ref >90)
Glucose, Bld: 182 mg/dL — ABNORMAL HIGH (ref 70–99)
POTASSIUM: 4.6 mmol/L (ref 3.5–5.1)
SODIUM: 130 mmol/L — AB (ref 135–145)

## 2014-08-05 LAB — GLUCOSE, CAPILLARY
GLUCOSE-CAPILLARY: 152 mg/dL — AB (ref 70–99)
Glucose-Capillary: 154 mg/dL — ABNORMAL HIGH (ref 70–99)
Glucose-Capillary: 145 mg/dL — ABNORMAL HIGH (ref 70–99)

## 2014-08-05 LAB — MAGNESIUM: MAGNESIUM: 1.9 mg/dL (ref 1.5–2.5)

## 2014-08-05 LAB — PHOSPHORUS: PHOSPHORUS: 2.9 mg/dL (ref 2.3–4.6)

## 2014-08-05 LAB — PROCALCITONIN: PROCALCITONIN: 1.06 ng/mL

## 2014-08-05 MED ORDER — MIDAZOLAM HCL 2 MG/2ML IJ SOLN
1.0000 mg | INTRAMUSCULAR | Status: DC | PRN
Start: 2014-08-05 — End: 2014-08-09
  Administered 2014-08-05: 2 mg via INTRAVENOUS
  Administered 2014-08-06: 1 mg via INTRAVENOUS
  Administered 2014-08-06: 2 mg via INTRAVENOUS
  Administered 2014-08-06 (×2): 1 mg via INTRAVENOUS
  Administered 2014-08-06: 2 mg via INTRAVENOUS
  Administered 2014-08-06: 1 mg via INTRAVENOUS
  Administered 2014-08-07 (×2): 2 mg via INTRAVENOUS
  Administered 2014-08-07: 1 mg via INTRAVENOUS
  Administered 2014-08-08 – 2014-08-09 (×7): 2 mg via INTRAVENOUS
  Filled 2014-08-05 (×18): qty 2
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
## 2014-08-06 LAB — PHOSPHORUS: PHOSPHORUS: 2.7 mg/dL (ref 2.3–4.6)

## 2014-08-06 LAB — BASIC METABOLIC PANEL
Anion gap: 7 (ref 5–15)
BUN: 48 mg/dL — ABNORMAL HIGH (ref 6–23)
CO2: 26 mmol/L (ref 19–32)
Calcium: 8.2 mg/dL — ABNORMAL LOW (ref 8.4–10.5)
Chloride: 99 mmol/L (ref 96–112)
Creatinine, Ser: 1.07 mg/dL (ref 0.50–1.10)
GFR calc non Af Amer: 59 mL/min — ABNORMAL LOW (ref >90)
GFR, EST AFRICAN AMERICAN: 68 mL/min — AB (ref >90)
Glucose, Bld: 188 mg/dL — ABNORMAL HIGH (ref 70–99)
Potassium: 4.4 mmol/L (ref 3.5–5.1)
Sodium: 132 mmol/L — ABNORMAL LOW (ref 135–145)

## 2014-08-06 LAB — CULTURE, RESPIRATORY

## 2014-08-06 LAB — CBC WITH DIFFERENTIAL/PLATELET
BASOS ABS: 0 10*3/uL (ref 0.0–0.1)
Basophils Relative: 0 % (ref 0–1)
Eosinophils Absolute: 0.3 10*3/uL (ref 0.0–0.7)
Eosinophils Relative: 4 % (ref 0–5)
HEMATOCRIT: 25 % — AB (ref 36.0–46.0)
Hemoglobin: 7.5 g/dL — ABNORMAL LOW (ref 12.0–15.0)
LYMPHS PCT: 13 % (ref 12–46)
Lymphs Abs: 1.1 10*3/uL (ref 0.7–4.0)
MCH: 24.9 pg — AB (ref 26.0–34.0)
MCHC: 30 g/dL (ref 30.0–36.0)
MCV: 83.1 fL (ref 78.0–100.0)
MONO ABS: 1.3 10*3/uL — AB (ref 0.1–1.0)
MONOS PCT: 15 % — AB (ref 3–12)
NEUTROS ABS: 5.9 10*3/uL (ref 1.7–7.7)
Neutrophils Relative %: 68 % (ref 43–77)
Platelets: 229 10*3/uL (ref 150–400)
RBC: 3.01 MIL/uL — ABNORMAL LOW (ref 3.87–5.11)
RDW: 18.5 % — AB (ref 11.5–15.5)
WBC: 8.7 10*3/uL (ref 4.0–10.5)

## 2014-08-06 LAB — GLUCOSE, CAPILLARY
GLUCOSE-CAPILLARY: 160 mg/dL — AB (ref 70–99)
GLUCOSE-CAPILLARY: 148 mg/dL — AB (ref 70–99)
Glucose-Capillary: 132 mg/dL — ABNORMAL HIGH (ref 70–99)
Glucose-Capillary: 141 mg/dL — ABNORMAL HIGH (ref 70–99)
Glucose-Capillary: 142 mg/dL — ABNORMAL HIGH (ref 70–99)
Glucose-Capillary: 145 mg/dL — ABNORMAL HIGH (ref 70–99)
Glucose-Capillary: 156 mg/dL — ABNORMAL HIGH (ref 70–99)
Glucose-Capillary: 161 mg/dL — ABNORMAL HIGH (ref 70–99)

## 2014-08-06 LAB — MAGNESIUM: Magnesium: 1.9 mg/dL (ref 1.5–2.5)

## 2014-08-06 LAB — CULTURE, RESPIRATORY W GRAM STAIN: Special Requests: NORMAL

## 2014-08-06 MED ORDER — FAMOTIDINE 40 MG/5ML PO SUSR
20.0000 mg | Freq: Two times a day (BID) | ORAL | Status: DC
  Administered 2014-08-06 – 2014-08-08 (×5): 20 mg
  Filled 2014-08-06 (×7): qty 2.5

## 2014-08-06 MED ORDER — SODIUM CHLORIDE 0.9 % IV BOLUS (SEPSIS)
250.0000 mL | Freq: Once | INTRAVENOUS | Status: AC
  Administered 2014-08-06: 250 mL via INTRAVENOUS

## 2014-08-06 NOTE — Progress Notes (Signed)
UR Completed.  [phone]  

## 2014-08-06 NOTE — Progress Notes (Signed)
Pt remains extremely agitated in spite of Versed 1 mg IVP given at 1930 and Fentanyl gtt increased to 145 mcg/h. Pt's HR 125, BP 125/109, pt continues to try and remove ETT. A second dose of versed 2 mg IVP administered with some calming affects. However pt's BP did decline, Fentanyl gtt decreased back to 120 mcg/h. Pt opens her eye and responds to painful stimuli. Pt's BP did increase to 113/45 with a MAP 61. Pt is calm and no longer breathing over the vent or trying to remove her ETT. Will continue to monitor
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
## 2014-08-07 LAB — BASIC METABOLIC PANEL
ANION GAP: 3 — AB (ref 5–15)
BUN: 51 mg/dL — AB (ref 6–23)
CALCIUM: 8 mg/dL — AB (ref 8.4–10.5)
CO2: 27 mmol/L (ref 19–32)
CREATININE: 1.02 mg/dL (ref 0.50–1.10)
Chloride: 105 mmol/L (ref 96–112)
GFR calc Af Amer: 72 mL/min — ABNORMAL LOW (ref >90)
GFR calc non Af Amer: 62 mL/min — ABNORMAL LOW (ref >90)
GLUCOSE: 175 mg/dL — AB (ref 70–99)
Potassium: 4.4 mmol/L (ref 3.5–5.1)
SODIUM: 135 mmol/L (ref 135–145)

## 2014-08-07 LAB — CBC
HCT: 24.4 % — ABNORMAL LOW (ref 36.0–46.0)
Hemoglobin: 7 g/dL — ABNORMAL LOW (ref 12.0–15.0)
MCH: 24.1 pg — AB (ref 26.0–34.0)
MCHC: 28.7 g/dL — ABNORMAL LOW (ref 30.0–36.0)
MCV: 83.8 fL (ref 78.0–100.0)
Platelets: 300 10*3/uL (ref 150–400)
RBC: 2.91 MIL/uL — ABNORMAL LOW (ref 3.87–5.11)
RDW: 18.8 % — AB (ref 11.5–15.5)
WBC: 9.4 10*3/uL (ref 4.0–10.5)

## 2014-08-07 LAB — GLUCOSE, CAPILLARY
GLUCOSE-CAPILLARY: 185 mg/dL — AB (ref 70–99)
GLUCOSE-CAPILLARY: 168 mg/dL — AB (ref 70–99)
GLUCOSE-CAPILLARY: 165 mg/dL — AB (ref 70–99)
Glucose-Capillary: 162 mg/dL — ABNORMAL HIGH (ref 70–99)
Glucose-Capillary: 192 mg/dL — ABNORMAL HIGH (ref 70–99)

## 2014-08-07 NOTE — Progress Notes (Addendum)
Progress Note from the Palliative Medicine Team at [facility]  Subjective: I spoke with [name] at bedside. He is tearful and upset at the thought that he has to make the decision to take her off the vent - this weighs very heavily on him. He says that him making this decision is like him killing her. I reassured him that this is the recommendations of the medical team and that the ETT is a temporary measure - she is only alive from the aggressive measures that we are providing and these are not improving her. Also that she expressed to the medical team she did not want to be reintubated. I shared that she often pulls at the ETT per nursing notes requiring more sedation. I assured him that we have tried everything we know to do to reverse [name]'s current condition. He needs time to process but we have made a plan to extubate Wednesday when he can plan to have all their family there for support (I think this will be important for [name]). I will continue to follow and be available for support.     Objective: Allergies  Allergen Reactions  . Iodine-131 Hives  . Vancomycin Nausea And Vomiting    [facility]: Tolerated vancomycin run in over 2 hours  . Zosyn [Piperacillin Sod-Tazobactam So] Swelling    Tolerated ceftriaxone  . Iohexol Rash   Scheduled Meds: . antiseptic oral rinse  7 mL Mouth Rinse QID  . ceFEPime (MAXIPIME) IV  1 g Intravenous Q12H  . chlorhexidine  15 mL Mouth Rinse BID  . enoxaparin (LOVENOX) injection  40 mg Subcutaneous Q24H  . famotidine  20 mg Per Tube BID  . feeding supplement (VITAL HIGH PROTEIN)  1,000 mL Per Tube Q24H  . fentaNYL  50 mcg Intravenous Once  . sodium chloride  10 mL Intravenous Q12H  . vancomycin  750 mg Intravenous Q36H   Continuous Infusions: . sodium chloride 10 mL/hr at 08/07/14 1200  . fentaNYL infusion INTRAVENOUS 120 mcg/hr (08/07/14 1239)  . norepinephrine (LEVOPHED) Adult infusion 21 mcg/min (08/07/14 1240)  . phenylephrine  (NEO-SYNEPHRINE) Adult infusion Stopped (08/05/14 2100)   PRN Meds:.Place/Maintain arterial line **AND** sodium chloride, bisacodyl, fentaNYL, fentaNYL, midazolam, MUSCLE RUB, ondansetron **OR** ondansetron (ZOFRAN) IV, sodium chloride  BP 88/33 mmHg  Pulse 103  Temp(Src) 99.1 F (37.3 C) (Core (Comment))  Resp 22  Ht 4\' 10"  (1.473 m)  Wt 100.699 kg (222 lb)  BMI 46.41 kg/m2  SpO2 99%   PPS: 10% sedated on vent   Intake/Output Summary (Last 24 hours) at 08/07/14 1256 Last data filed at 08/07/14 1200  Gross per 24 hour  Intake 2101.33 ml  Output    835 ml  Net 1266.33 ml       Physical Exam:  General: Sedated on vent, obese, NAD HEENT: [place]/AT, ETT, moist mucous membranes Chest: No labored breathing, symmetric, mechanical ventilated CVS: RRR Abdomen: Soft, NT, ND, +BS Ext: Bilat BKA, warm to touch Neuro: Sedated, unable to follow commands    Labs: CBC    Component Value Date/Time   WBC 9.4 08/07/2014 0410   RBC 2.91* 08/07/2014 0410   HGB 7.0* 08/07/2014 0410   HCT 24.4* 08/07/2014 0410   PLT 300 08/07/2014 0410   MCV 83.8 08/07/2014 0410   MCH 24.1* 08/07/2014 0410   MCHC 28.7* 08/07/2014 0410   RDW 18.8* 08/07/2014 0410   LYMPHSABS 1.1 08/06/2014 0330   MONOABS 1.3* 08/06/2014 0330   EOSABS 0.3 08/06/2014  0330   BASOSABS 0.0 08/06/2014 0330    BMET    Component Value Date/Time   NA 135 08/07/2014 0410   K 4.4 08/07/2014 0410   CL 105 08/07/2014 0410   CO2 27 08/07/2014 0410   GLUCOSE 175* 08/07/2014 0410   BUN 51* 08/07/2014 0410   CREATININE 1.02 08/07/2014 0410   CALCIUM 8.0* 08/07/2014 0410   GFRNONAA 62* 08/07/2014 0410   GFRAA 72* 08/07/2014 0410    CMP     Component Value Date/Time   NA 135 08/07/2014 0410   K 4.4 08/07/2014 0410   CL 105 08/07/2014 0410   CO2 27 08/07/2014 0410   GLUCOSE 175* 08/07/2014 0410   BUN 51* 08/07/2014 0410   CREATININE 1.02 08/07/2014 0410   CALCIUM 8.0* 08/07/2014 0410   PROT 5.2* 07/27/2014 0323    ALBUMIN 1.3* 07/27/2014 0323   AST 12 07/27/2014 0323   ALT 16 07/27/2014 0323   ALKPHOS 66 07/27/2014 0323   BILITOT 0.6 07/27/2014 0323   GFRNONAA 62* 08/07/2014 0410   GFRAA 72* 08/07/2014 0410    Assessment and Plan: 1. Code Status: NO CPR/shock. Continue ventilator and vasopressors/ antiarrhythmics.  2. Symptom Control: 1. Anxiety/Agitation: Continue prn versed and haldol for comfort while intubated.  2. Decreased appetite: Continue temporary artificial feeding for now.  3. Weakness: Continue medical management.  4. Constipation: Dulcolax supp daily prn.  3. Psycho/Social: Emotional support provided to patient and family at bedside.  4. Disposition: To be determined. Likely hospital death with one way extubation tentative for Wednesday.     Time In Time Out Total Time Spent with Patient Total Overall Time  1125 1200     Greater than 50%  of this time was spent counseling and coordinating care related to the above assessment and plan.   Yong Channel, NP Palliative Medicine Team Pager # [phone] (M-F 8a-5p) Team Phone # [phone] (Nights/Weekends)  1
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
## 2014-08-07 NOTE — Progress Notes (Signed)
[facility]       Subjective: Intubated restrained, more sedated today  Antibiotics:  Anti-infectives    Start     Dose/Rate Route Frequency Ordered Stop   08/04/14 2200  ceFEPIme (MAXIPIME) 1 g in dextrose 5 % 50 mL IVPB     1 g 100 mL/hr over 30 Minutes Intravenous Every 12 hours 08/04/14 1423     08/03/14 1100  vancomycin (VANCOCIN) IVPB 750 mg/150 ml premix     750 mg 150 mL/hr over 60 Minutes Intravenous Every 36 hours 08/03/14 1044     08/03/14 1045  ceFEPIme (MAXIPIME) 1 g in dextrose 5 % 50 mL IVPB  Status:  Discontinued     1 g 100 mL/hr over 30 Minutes Intravenous Every 24 hours 08/03/14 1037 08/04/14 1423   07/31/14 1515  ceFAZolin (ANCEF) IVPB 2 g/50 mL premix  Status:  Discontinued     2 g 100 mL/hr over 30 Minutes Intravenous 3 times per day 07/31/14 1510 08/03/14 1028   07/28/14 2200  levofloxacin (LEVAQUIN) IVPB 750 mg  Status:  Discontinued     750 mg 100 mL/hr over 90 Minutes Intravenous Every 24 hours 07/28/14 2140 07/31/14 1433   07/26/14 0600  vancomycin (VANCOCIN) IVPB 750 mg/150 ml premix  Status:  Discontinued     750 mg 75 mL/hr over 120 Minutes Intravenous Every 12 hours 07/26/14 0545 07/30/14 2016   07/26/14 0200  levofloxacin (LEVAQUIN) IVPB 750 mg  Status:  Discontinued     750 mg 100 mL/hr over 90 Minutes Intravenous Every 24 hours 07/25/14 0704 07/27/14 1152   07/25/14 0800  aztreonam (AZACTAM) 2 g in dextrose 5 % 50 mL IVPB  Status:  Discontinued     2 g 100 mL/hr over 30 Minutes Intravenous Every 8 hours 07/25/14 0705 07/26/14 1723   07/25/14 0000  levofloxacin (LEVAQUIN) IVPB 750 mg     750 mg 100 mL/hr over 90 Minutes Intravenous  Once Jul 29, 2014 2349 07/25/14 0325   07/25/14 0000  aztreonam (AZACTAM) 2 g in dextrose 5 % 50 mL IVPB     2 g 100 mL/hr over 30 Minutes Intravenous  Once 07/29/14 2349 07/25/14 0146   07/25/14 0000  vancomycin (VANCOCIN) IVPB 1000 mg/200 mL premix     1,000 mg 200 mL/hr over 60  Minutes Intravenous  Once 07/29/14 2349 07/25/14 0435   07/25/14 0000  levofloxacin (LEVAQUIN) IVPB 750 mg  Status:  Discontinued     750 mg 100 mL/hr over 90 Minutes Intravenous  Once 07-29-2014 2352 Jul 29, 2014 2354   07/25/14 0000  aztreonam (AZACTAM) 2 g in dextrose 5 % 50 mL IVPB  Status:  Discontinued     2 g 100 mL/hr over 30 Minutes Intravenous  Once 07-29-14 2352 07-29-14 2354   07/25/14 0000  vancomycin (VANCOCIN) IVPB 1000 mg/200 mL premix  Status:  Discontinued     1,000 mg 200 mL/hr over 60 Minutes Intravenous  Once 07-29-14 2352 July 29, 2014 2354      Medications: Scheduled Meds: . antiseptic oral rinse  7 mL Mouth Rinse QID  . ceFEPime (MAXIPIME) IV  1 g Intravenous Q12H  . chlorhexidine  15 mL Mouth Rinse BID  . enoxaparin (LOVENOX) injection  40 mg Subcutaneous Q24H  . famotidine  20 mg Per Tube BID  . feeding supplement (VITAL HIGH PROTEIN)  1,000 mL Per Tube Q24H  . fentaNYL  50 mcg Intravenous Once  . sodium chloride  10 mL Intravenous Q12H  .  vancomycin  750 mg Intravenous Q36H   Continuous Infusions: . sodium chloride 10 mL/hr at 08/07/14 1100  . fentaNYL infusion INTRAVENOUS 120 mcg/hr (08/07/14 1239)  . norepinephrine (LEVOPHED) Adult infusion 21 mcg/min (08/07/14 1240)  . phenylephrine (NEO-SYNEPHRINE) Adult infusion Stopped (08/05/14 2100)   PRN Meds:.Place/Maintain arterial line **AND** sodium chloride, bisacodyl, fentaNYL, fentaNYL, midazolam, MUSCLE RUB, ondansetron **OR** ondansetron (ZOFRAN) IV, sodium chloride    Objective: Weight change: -1 lb (-0.454 kg)  Intake/Output Summary (Last 24 hours) at 08/07/14 1248 Last data filed at 08/07/14 1100  Gross per 24 hour  Intake 2061.53 ml  Output    835 ml  Net 1226.53 ml   Blood pressure 180/62, pulse 123, temperature 99.5 F (37.5 C), temperature source Core (Comment), resp. rate 33, height 4\' 10"  (1.473 m), weight 222 lb (100.699 kg), SpO2 96 %. Temp:  [98.2 F (36.8 C)-99.7 F (37.6 C)] 99.5 F  (37.5 C) (02/15 1100) Pulse Rate:  [76-123] 123 (02/15 1151) Resp:  [17-39] 33 (02/15 1151) BP: (76-180)/(33-140) 180/62 mmHg (02/15 1151) SpO2:  [96 %-100 %] 96 % (02/15 1100) FiO2 (%):  [40 %] 40 % (02/15 1151) Weight:  [222 lb (100.699 kg)] 222 lb (100.699 kg) (02/15 0400)  Physical Exam: General: Intubated sedated  Cardiovascular regular rate, no murmurs Pulmonary: Improved aeration on exam  Abdomen obese  Skin patient has left IJ in place Neuro qudriplegic CBC: CBC Latest Ref Rng 08/07/2014 08/06/2014 08/05/2014  WBC 4.0 - 10.5 K/uL 9.4 8.7 10.3  Hemoglobin 12.0 - 15.0 g/dL 7.0(L) 7.5(L) 7.7(L)  Hematocrit 36.0 - 46.0 % 24.4(L) 25.0(L) 26.5(L)  Platelets 150 - 400 K/uL 300 229 279      BMET  Recent Labs  08/06/14 0330 08/07/14 0410  NA 132* 135  K 4.4 4.4  CL 99 105  CO2 26 27  GLUCOSE 188* 175*  BUN 48* 51*  CREATININE 1.07 1.02  CALCIUM 8.2* 8.0*     Liver Panel  No results for input(s): PROT, ALBUMIN, AST, ALT, ALKPHOS, BILITOT, BILIDIR, IBILI in the last 72 hours.     Sedimentation Rate No results for input(s): ESRSEDRATE in the last 72 hours. C-Reactive Protein No results for input(s): CRP in the last 72 hours.  Micro Results: Recent Results (from the past 720 hour(s))  Blood Culture (routine x 2)     Status: None   Collection Time: 07/25/14  1:00 AM  Result Value Ref Range Status   Specimen Description BLOOD RIGHT FEMORAL ARTERY  Final   Special Requests BOTTLES DRAWN AEROBIC AND ANAEROBIC 5CC EA  Final   Culture   Final    STAPHYLOCOCCUS AUREUS Note: RIFAMPIN AND GENTAMICIN SHOULD NOT BE USED AS SINGLE DRUGS FOR TREATMENT OF STAPH INFECTIONS. Note: Gram Stain Report Called to,Read Back By and Verified With: CELENIE SOSA 07/26/14 5:30AM THOMI/ORTCH Performed at [facility]    Report Status 07/28/2014 FINAL  Final   Organism ID, Bacteria STAPHYLOCOCCUS AUREUS  Final      Susceptibility   Staphylococcus aureus - MIC*     CLINDAMYCIN <=0.25 SENSITIVE Sensitive     ERYTHROMYCIN <=0.25 SENSITIVE Sensitive     GENTAMICIN <=0.5 SENSITIVE Sensitive     LEVOFLOXACIN 0.25 SENSITIVE Sensitive     OXACILLIN 0.5 SENSITIVE Sensitive     PENICILLIN >=0.5 RESISTANT Resistant     RIFAMPIN <=0.5 SENSITIVE Sensitive     TRIMETH/SULFA <=10 SENSITIVE Sensitive     VANCOMYCIN 1 SENSITIVE Sensitive     TETRACYCLINE <=1 SENSITIVE Sensitive  MOXIFLOXACIN <=0.25 SENSITIVE Sensitive     * STAPHYLOCOCCUS AUREUS  Blood Culture (routine x 2)     Status: None   Collection Time: 07/25/14  1:00 AM  Result Value Ref Range Status   Specimen Description BLOOD RIGHT ARM  Final   Special Requests BOTTLES DRAWN AEROBIC ONLY 2CC  Final   Culture   Final    NO GROWTH 5 DAYS Note: Culture results may be compromised due to an inadequate volume of blood received in culture bottles. Performed at [facility]    Report Status 07/31/2014 FINAL  Final  Urine culture     Status: None   Collection Time: 07/25/14  1:27 AM  Result Value Ref Range Status   Specimen Description URINE, CATHETERIZED  Final   Special Requests NONE  Final   Colony Count   Final    80,000 COLONIES/ML Performed at [facility]    Culture   Final    PROTEUS MIRABILIS Performed at [facility]    Report Status 07/27/2014 FINAL  Final   Organism ID, Bacteria PROTEUS MIRABILIS  Final      Susceptibility   Proteus mirabilis - MIC*    AMPICILLIN <=2 SENSITIVE Sensitive     CEFAZOLIN 8 SENSITIVE Sensitive     CEFTRIAXONE <=1 SENSITIVE Sensitive     CIPROFLOXACIN >=4 RESISTANT Resistant     GENTAMICIN 8 INTERMEDIATE Intermediate     LEVOFLOXACIN >=8 RESISTANT Resistant     NITROFURANTOIN 128 RESISTANT Resistant     TOBRAMYCIN 8 INTERMEDIATE Intermediate     TRIMETH/SULFA >=320 RESISTANT Resistant     PIP/TAZO <=4 SENSITIVE Sensitive     * PROTEUS MIRABILIS  Respiratory virus panel (routine influenza)     Status: None   Collection Time:  07/25/14 12:13 PM  Result Value Ref Range Status   Respiratory Syncytial Virus A Negative Negative Final   Respiratory Syncytial Virus B Negative Negative Final   Influenza A Negative Negative Final   Influenza B Negative Negative Final   Parainfluenza 1 Negative Negative Final   Parainfluenza 2 Negative Negative Final   Parainfluenza 3 Negative Negative Final   Metapneumovirus Negative Negative Final   Rhinovirus Negative Negative Final   Adenovirus Negative Negative Final    Comment: (NOTE) Performed At: [facility] [address] [place], [place] [id number] Mila Homer MD JO:[id number]   MRSA PCR Screening     Status: None   Collection Time: 07/25/14  2:59 PM  Result Value Ref Range Status   MRSA by PCR NEGATIVE NEGATIVE Final    Comment:        The GeneXpert MRSA Assay (FDA approved for NASAL specimens only), is one component of a comprehensive MRSA colonization surveillance program. It is not intended to diagnose MRSA infection nor to guide or monitor treatment for MRSA infections.   Culture, blood (routine x 2)     Status: None   Collection Time: 07/28/14  3:40 PM  Result Value Ref Range Status   Specimen Description BLOOD NECK LEFT  Final   Special Requests BOTTLES DRAWN AEROBIC AND ANAEROBIC 10CC  Final   Culture   Final    NO GROWTH 5 DAYS Performed at [facility]    Report Status 08/03/2014 FINAL  Final  Culture, blood (routine x 2)     Status: None   Collection Time: 07/28/14  7:26 PM  Result Value Ref Range Status   Specimen Description BLOOD RIGHT HAND  Final  Special Requests BOTTLES DRAWN AEROBIC ONLY 3CC  Final   Culture   Final    NO GROWTH 5 DAYS Performed at [facility]    Report Status 08/04/2014 FINAL  Final  Culture, respiratory (NON-Expectorated)     Status: None   Collection Time: 07/28/14  9:15 PM  Result Value Ref Range Status   Specimen Description TRACHEAL ASPIRATE  Final   Special Requests NONE   Final   Gram Stain   Final    NO WBC SEEN NO SQUAMOUS EPITHELIAL CELLS SEEN NO ORGANISMS SEEN Performed at [facility]    Culture   Final    NO GROWTH 2 DAYS Performed at [facility]    Report Status 07/31/2014 FINAL  Final  Culture, respiratory (NON-Expectorated)     Status: None   Collection Time: 08/02/14  5:41 PM  Result Value Ref Range Status   Specimen Description TRACHEAL ASPIRATE  Final   Special Requests Normal  Final   Gram Stain   Final    MODERATE WBC PRESENT,BOTH PMN AND MONONUCLEAR RARE SQUAMOUS EPITHELIAL CELLS PRESENT NO ORGANISMS SEEN Performed at [facility]    Culture   Final    RARE CANDIDA ALBICANS Performed at [facility]    Report Status 08/06/2014 FINAL  Final  Culture, blood (routine x 2)     Status: None (Preliminary result)   Collection Time: 08/03/14 10:45 AM  Result Value Ref Range Status   Specimen Description BLOOD RIGHT HAND  Final   Special Requests BOTTLES DRAWN AEROBIC ONLY 5CC  Final   Culture   Final           BLOOD CULTURE RECEIVED NO GROWTH TO DATE CULTURE WILL BE HELD FOR 5 DAYS BEFORE ISSUING A FINAL NEGATIVE REPORT Performed at [facility]    Report Status PENDING  Incomplete  Culture, Urine     Status: None   Collection Time: 08/03/14 10:54 AM  Result Value Ref Range Status   Specimen Description URINE, RANDOM  Final   Special Requests Normal  Final   Colony Count   Final    >=100,000 COLONIES/ML Performed at [facility]    Culture YEAST Performed at [facility]   Final   Report Status 08/04/2014 FINAL  Final  Culture, blood (routine x 2)     Status: None (Preliminary result)   Collection Time: 08/03/14 10:55 AM  Result Value Ref Range Status   Specimen Description BLOOD RIGHT ARM  Final   Special Requests BOTTLES DRAWN AEROBIC ONLY 2.5CC  Final   Culture   Final           BLOOD CULTURE RECEIVED NO GROWTH TO DATE CULTURE WILL BE HELD FOR 5 DAYS BEFORE  ISSUING A FINAL NEGATIVE REPORT Performed at [facility]    Report Status PENDING  Incomplete    Studies/Results: No results found.    Assessment/Plan:  Active Problems:   Sepsis   Sepsis associated hypotension   Sepsis due to urinary tract infection   Essential hypertension   Moderate malnutrition   Anemia of chronic disease   Protein-calorie malnutrition, severe   Staphylococcus aureus bacteremia with sepsis   Discitis of thoracic region   Acute respiratory failure   Acute encephalopathy   Severe sepsis   Bacteremia due to Staphylococcus aureus   Acute on chronic respiratory failure with hypoxia   Midline thoracic back pain   Sacral decubitus ulcer   Chronic diastolic CHF (  congestive heart failure)   Normocytic anemia   Chronic indwelling Foley catheter   Paraplegia   S/P BKA (below knee amputation) bilateral   Moderate protein-calorie malnutrition   Acute respiratory failure with hypoxia   Encephalopathy acute   Palliative care by specialist   DNR (do not resuscitate)   Diskitis   HCAP (healthcare-associated pneumonia)   Palliative care encounter   Weakness generalized   Decreased appetite   Fungus present in urine    [name] is a 54 y.o. female with  . female with Paralysis, admitted with sepsis found to have methicillin sensitive Staphylococcus aureus bacteremia, diskitis on plain films,  who required intubation remains on the ventilator now with fevers  #1 New onset fevers worsening infiltrates on chest x-ray, sepsis    Had been worrisome for healthcare associated pneumonia  I sent cultures yesterday from tracheal aspirate which only grew candida  I dont think we have good rationale for her broad spectrum abx from culture standpoint.   I would favor de-escalating to cefazolin again if not today certainly in 2 days time.  I think it is more likely that her staph aureus is the cause of her ongoing failure to improve. I'm concerned that  her line could've been infected as it was placed the day that we got cultures to prove clearance of bacteremia (though those cultures have been negative)  Her spine could also be clear nidus  #2 Yeast in urine:  --DC'd foley and place new one to treat this     #3    [place] [facility] Staphylococcus aureus bacteremia   Staphylococcus aureus bacteremia (SAB) is associated with a high rate of complications and mortality. Specific aspects of clinical management are critical to optimizing the outcome of patients with SAB. Therefore, the [facility] Antimicrobial Management Team [facility]) has initiated an intervention aimed at improving the management of SAB at [facility]. To do so, Infectious Diseases physicians are providing an evidence-based consult for the management of all patients with SAB.     Yes No Comments  Perform follow-up blood cultures (even if the patient is afebrile) to ensure clearance of bacteremia [X]  [ ]   blood cultures being drawn pm 07/28/14  day that she had new IJ placed , NGTD  Remove vascular catheter and obtain follow-up blood cultures after the removal of the catheter [ ]  [ ]  She had IJ placed before I would have preferred, may want to consider DC it certainly if her cultures from the fifth are positive he will need to come out   Perform echocardiography to evaluate for endocarditis (transthoracic ECHO is 40-50% sensitive, TEE is > 90% sensitive) [ ]  [ ]  Please keep in mind, that neither test can definitively EXCLUDE endocarditis, and that should clinical suspicion remain high for endocarditis the patient should then still be treated with an "endocarditis" duration of therapy = 6 weeks ordered  GREATLY APPRECIATE CARDIOLOGY PERFORMING TEE WHILE SHE IS ON THE VENTILATOR       Ensure source control [ ]  [ ]  Have all abscesses been drained effectively? Have deep seeded infections (septic joints  or osteomyelitis) had appropriate surgical debridement?  Not clear what source is pneumonia? Her back  Investigate for "metastatic" sites of infection [ ]  [ ]  Does the patient have ANY symptom or physical exam finding that would suggest a deeper infection (back or neck pain that may be suggestive of vertebral osteomyelitis or epidural abscess, muscle pain that could be a  symptom of pyomyositis)?  Keep in mind that for deep seeded infections MRI imaging with contrast is preferred rather than other often insensitive tests such as plain x-rays, especially early in a patient's presentation.   GIVEN HER SIGNIFICANT BACK PAIN AND FINDINGS ON PLAIN FILM OF CHEST I WOULD RECOMMEND EITHER MRI T SPINE WHEN ABLE  Change antibiotic therapy to ANCEF -AFTER we de-escalate her HCAP Coverage   Beta-lactam antibiotics are preferred for MSSA due to higher cure rates.  If on Vancomycin, goal trough should be 15 - 20 mcg/mL  Estimated duration of IV antibiotic therapy: 8 weeks esp if she has diskitis   Consult case management for probably prolonged outpatient IV antibiotic therapy   It does NOT appear likely she will survive this hospitalization and that perhaps palliative care instead may be pursued.  I will disengage at this time.  Please call us back if she stabilizes and appears to have a trajectory where she will survive be able to get MRI of her back and be dc on IV antibiotics.   Dr. Orvan Falconer is on for the remainder of this month.       LOS: 13 days   Acey Lav 08/07/2014, 12:48 PM

## 2014-08-07 NOTE — Progress Notes (Signed)
ANTIBIOTIC CONSULT NOTE - FOLLOW UP  Pharmacy Consult:  Vancomycin / Cefepime Indication:  VAP  Allergies  Allergen Reactions  . Iodine-131 Hives  . Vancomycin Nausea And Vomiting    [facility] Tolerated vancomycin run in over 2 hours  . Zosyn [Piperacillin Sod-Tazobactam So] Swelling    Tolerated ceftriaxone  . Iohexol Rash    Patient Measurements: Height: 4\' 10"  (147.3 cm) Weight: 222 lb (100.699 kg) IBW/kg (Calculated) : 40.9  Vital Signs: Temp: 99.4 F (37.4 C) (02/15 0600) Temp Source: Core (Comment) (02/15 0600) BP: 106/44 mmHg (02/15 0600) Pulse Rate: 83 (02/15 0600) Intake/Output from previous day: 02/14 0701 - 02/15 0700 In: 2105.7 [I.V.:835.7; NG/GT:1070; IV Piggyback:200] Out: 1105 [Urine:1105]  Labs:  Recent Labs  08/05/14 0244 08/05/14 2025 08/06/14 0330 08/07/14 0410  WBC 11.7* 10.3 8.7 9.4  HGB 7.5* 7.7* 7.5* 7.0*  PLT 278 279 229 300  CREATININE 1.13*  --  1.07 1.02   Estimated Creatinine Clearance: 66 mL/min (by C-G formula based on Cr of 1.02). No results for input(s): VANCOTROUGH, VANCOPEAK, VANCORANDOM, GENTTROUGH, GENTPEAK, GENTRANDOM, TOBRATROUGH, TOBRAPEAK, TOBRARND, AMIKACINPEAK, AMIKACINTROU, AMIKACIN in the last 72 hours.   Microbiology: Recent Results (from the past 720 hour(s))  Blood Culture (routine x 2)     Status: None   Collection Time: 07/25/14  1:00 AM  Result Value Ref Range Status   Specimen Description BLOOD RIGHT FEMORAL ARTERY  Final   Special Requests BOTTLES DRAWN AEROBIC AND ANAEROBIC 5CC EA  Final   Culture   Final    STAPHYLOCOCCUS AUREUS Note: RIFAMPIN AND GENTAMICIN SHOULD NOT BE USED AS SINGLE DRUGS FOR TREATMENT OF STAPH INFECTIONS. Note: Gram Stain Report Called to,Read Back By and Verified With: CELENIE SOSA 07/26/14 5:30AM THOMI/ORTCH Performed at [facility]    Report Status 07/28/2014 FINAL  Final   Organism ID, Bacteria STAPHYLOCOCCUS AUREUS  Final      Susceptibility   Staphylococcus  aureus - MIC*    CLINDAMYCIN <=0.25 SENSITIVE Sensitive     ERYTHROMYCIN <=0.25 SENSITIVE Sensitive     GENTAMICIN <=0.5 SENSITIVE Sensitive     LEVOFLOXACIN 0.25 SENSITIVE Sensitive     OXACILLIN 0.5 SENSITIVE Sensitive     PENICILLIN >=0.5 RESISTANT Resistant     RIFAMPIN <=0.5 SENSITIVE Sensitive     TRIMETH/SULFA <=10 SENSITIVE Sensitive     VANCOMYCIN 1 SENSITIVE Sensitive     TETRACYCLINE <=1 SENSITIVE Sensitive     MOXIFLOXACIN <=0.25 SENSITIVE Sensitive     * STAPHYLOCOCCUS AUREUS  Blood Culture (routine x 2)     Status: None   Collection Time: 07/25/14  1:00 AM  Result Value Ref Range Status   Specimen Description BLOOD RIGHT ARM  Final   Special Requests BOTTLES DRAWN AEROBIC ONLY 2CC  Final   Culture   Final    NO GROWTH 5 DAYS Note: Culture results may be compromised due to an inadequate volume of blood received in culture bottles. Performed at [facility]    Report Status 07/31/2014 FINAL  Final  Urine culture     Status: None   Collection Time: 07/25/14  1:27 AM  Result Value Ref Range Status   Specimen Description URINE, CATHETERIZED  Final   Special Requests NONE  Final   Colony Count   Final    80,000 COLONIES/ML Performed at [facility]    Culture   Final    PROTEUS MIRABILIS Performed at [facility]    Report Status 07/27/2014 FINAL  Final   Organism ID, Bacteria PROTEUS MIRABILIS  Final      Susceptibility   Proteus mirabilis - MIC*    AMPICILLIN <=2 SENSITIVE Sensitive     CEFAZOLIN 8 SENSITIVE Sensitive     CEFTRIAXONE <=1 SENSITIVE Sensitive     CIPROFLOXACIN >=4 RESISTANT Resistant     GENTAMICIN 8 INTERMEDIATE Intermediate     LEVOFLOXACIN >=8 RESISTANT Resistant     NITROFURANTOIN 128 RESISTANT Resistant     TOBRAMYCIN 8 INTERMEDIATE Intermediate     TRIMETH/SULFA >=320 RESISTANT Resistant     PIP/TAZO <=4 SENSITIVE Sensitive     * PROTEUS MIRABILIS  Respiratory virus panel (routine influenza)     Status: None    Collection Time: 07/25/14 12:13 PM  Result Value Ref Range Status   Respiratory Syncytial Virus A Negative Negative Final   Respiratory Syncytial Virus B Negative Negative Final   Influenza A Negative Negative Final   Influenza B Negative Negative Final   Parainfluenza 1 Negative Negative Final   Parainfluenza 2 Negative Negative Final   Parainfluenza 3 Negative Negative Final   Metapneumovirus Negative Negative Final   Rhinovirus Negative Negative Final   Adenovirus Negative Negative Final    Comment: (NOTE) Performed At: [facility] [address] [place], [place] [id number] Mila Homer MD [id number]   MRSA PCR Screening     Status: None   Collection Time: 07/25/14  2:59 PM  Result Value Ref Range Status   MRSA by PCR NEGATIVE NEGATIVE Final    Comment:        The GeneXpert MRSA Assay (FDA approved for NASAL specimens only), is one component of a comprehensive MRSA colonization surveillance program. It is not intended to diagnose MRSA infection nor to guide or monitor treatment for MRSA infections.   Culture, blood (routine x 2)     Status: None   Collection Time: 07/28/14  3:40 PM  Result Value Ref Range Status   Specimen Description BLOOD NECK LEFT  Final   Special Requests BOTTLES DRAWN AEROBIC AND ANAEROBIC 10CC  Final   Culture   Final    NO GROWTH 5 DAYS Performed at [facility]    Report Status 08/03/2014 FINAL  Final  Culture, blood (routine x 2)     Status: None   Collection Time: 07/28/14  7:26 PM  Result Value Ref Range Status   Specimen Description BLOOD RIGHT HAND  Final   Special Requests BOTTLES DRAWN AEROBIC ONLY 3CC  Final   Culture   Final    NO GROWTH 5 DAYS Performed at [facility]    Report Status 08/04/2014 FINAL  Final  Culture, respiratory (NON-Expectorated)     Status: None   Collection Time: 07/28/14  9:15 PM  Result Value Ref Range Status   Specimen Description TRACHEAL ASPIRATE  Final   Special  Requests NONE  Final   Gram Stain   Final    NO WBC SEEN NO SQUAMOUS EPITHELIAL CELLS SEEN NO ORGANISMS SEEN Performed at [facility]    Culture   Final    NO GROWTH 2 DAYS Performed at [facility]    Report Status 07/31/2014 FINAL  Final  Culture, respiratory (NON-Expectorated)     Status: None   Collection Time: 08/02/14  5:41 PM  Result Value Ref Range Status   Specimen Description TRACHEAL ASPIRATE  Final   Special Requests Normal  Final   Gram Stain   Final    MODERATE WBC  PRESENT,BOTH PMN AND MONONUCLEAR RARE SQUAMOUS EPITHELIAL CELLS PRESENT NO ORGANISMS SEEN Performed at [facility]    Culture   Final    RARE CANDIDA ALBICANS Performed at [facility]    Report Status 08/06/2014 FINAL  Final  Culture, blood (routine x 2)     Status: None (Preliminary result)   Collection Time: 08/03/14 10:45 AM  Result Value Ref Range Status   Specimen Description BLOOD RIGHT HAND  Final   Special Requests BOTTLES DRAWN AEROBIC ONLY 5CC  Final   Culture   Final           BLOOD CULTURE RECEIVED NO GROWTH TO DATE CULTURE WILL BE HELD FOR 5 DAYS BEFORE ISSUING A FINAL NEGATIVE REPORT Performed at [facility]    Report Status PENDING  Incomplete  Culture, Urine     Status: None   Collection Time: 08/03/14 10:54 AM  Result Value Ref Range Status   Specimen Description URINE, RANDOM  Final   Special Requests Normal  Final   Colony Count   Final    >=100,000 COLONIES/ML Performed at [facility]    Culture YEAST Performed at [facility]   Final   Report Status 08/04/2014 FINAL  Final  Culture, blood (routine x 2)     Status: None (Preliminary result)   Collection Time: 08/03/14 10:55 AM  Result Value Ref Range Status   Specimen Description BLOOD RIGHT ARM  Final   Special Requests BOTTLES DRAWN AEROBIC ONLY 2.5CC  Final   Culture   Final           BLOOD CULTURE RECEIVED NO GROWTH TO DATE CULTURE WILL BE HELD FOR 5  DAYS BEFORE ISSUING A FINAL NEGATIVE REPORT Performed at [facility]    Report Status PENDING  Incomplete      Assessment: 54 YOF on vancomycin and cefepime for MSSA PNA (concerning for VAP).  Her renal function is continuing to improve.    Vanc 2/2 >> 2/8; restart 2/11 >> Azactam 2/2 >> 2/3 LVQ 2/2>2/4; restart 2/5 >> 2/8 Ancef 2/8 >> 2/11 Cefepime 2/11 >>  2/7 VT = 54.3 on 750 q12 (SCr 1.04).  Est VT on 2/11 likely 13.5 (t1/2~43 hrs)  2/2 Urine - 80K Proteus (sensitive to amp, cefazolin) 2/2 Blood - MSSA in 1 of 2 - sens oxacillin  2/5 Blood - negative 2/2 RVP - negative 2/5 TA - NGTD 2/10 TA - C.albicans 2/11 BCx x2 - NGTD 2/11 Urine - yeast (replaced foley)   Goal of Therapy:  Vancomycin trough level 15-20 mcg/ml   Plan:  - Vanc  IV Q36H - Cefepime 1gm IV Q12H - Monitor renal fxn, clinical progress, vanc level soon if vanc to continue    Ziva Nunziata D. Laney Potash, PharmD, BCPS Pager:  [phone] 08/07/2014, 8:56 AM

## 2014-08-08 ENCOUNTER — Inpatient Hospital Stay (HOSPITAL_COMMUNITY): Payer: BLUE CROSS/BLUE SHIELD

## 2014-08-08 DIAGNOSIS — R0600 Dyspnea, unspecified: Secondary | ICD-10-CM

## 2014-08-08 DIAGNOSIS — R0689 Other abnormalities of breathing: Secondary | ICD-10-CM

## 2014-08-08 LAB — BASIC METABOLIC PANEL
Anion gap: 9 (ref 5–15)
BUN: 54 mg/dL — ABNORMAL HIGH (ref 6–23)
CO2: 24 mmol/L (ref 19–32)
Calcium: 8.6 mg/dL (ref 8.4–10.5)
Chloride: 105 mmol/L (ref 96–112)
Creatinine, Ser: 0.95 mg/dL (ref 0.50–1.10)
GFR calc Af Amer: 78 mL/min — ABNORMAL LOW (ref >90)
GFR calc non Af Amer: 68 mL/min — ABNORMAL LOW (ref >90)
GLUCOSE: 198 mg/dL — AB (ref 70–99)
POTASSIUM: 4.5 mmol/L (ref 3.5–5.1)
SODIUM: 138 mmol/L (ref 135–145)

## 2014-08-08 LAB — GLUCOSE, CAPILLARY
GLUCOSE-CAPILLARY: 170 mg/dL — AB (ref 70–99)
GLUCOSE-CAPILLARY: 199 mg/dL — AB (ref 70–99)
GLUCOSE-CAPILLARY: 190 mg/dL — AB (ref 70–99)
GLUCOSE-CAPILLARY: 172 mg/dL — AB (ref 70–99)
Glucose-Capillary: 182 mg/dL — ABNORMAL HIGH (ref 70–99)
Glucose-Capillary: 204 mg/dL — ABNORMAL HIGH (ref 70–99)

## 2014-08-08 LAB — CBC
HEMATOCRIT: 25 % — AB (ref 36.0–46.0)
HEMOGLOBIN: 7.2 g/dL — AB (ref 12.0–15.0)
MCH: 24.7 pg — ABNORMAL LOW (ref 26.0–34.0)
MCHC: 28.8 g/dL — AB (ref 30.0–36.0)
MCV: 85.6 fL (ref 78.0–100.0)
Platelets: 326 10*3/uL (ref 150–400)
RBC: 2.92 MIL/uL — ABNORMAL LOW (ref 3.87–5.11)
RDW: 19.1 % — ABNORMAL HIGH (ref 11.5–15.5)
WBC: 12.9 10*3/uL — ABNORMAL HIGH (ref 4.0–10.5)

## 2014-08-08 MED ORDER — VASOPRESSIN 20 UNIT/ML IV SOLN
0.0300 [IU]/min | INTRAVENOUS | Status: DC
  Administered 2014-08-08 – 2014-08-09 (×2): 0.03 [IU]/min via INTRAVENOUS
  Filled 2014-08-08 (×3): qty 2
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
## 2014-08-08 NOTE — Progress Notes (Addendum)
Progress Note from the Palliative Medicine Team at [facility]  Subjective: I spoke with Mr. [name] again and I explained to her daughter, [name], the plan to extubate tomorrow. I walked them through the process of weaning her on pressure support to get medication at an adequate level prior to extubation. I explained that we will be focusing on keeping her comfortable at that point. They both asked if she would be awake and able to talk and I explained that this is unlikely. They understand that we do not know how long she may live after extubation but I am guessing it may be short given the amount of support she has been requiring.     Objective: Allergies  Allergen Reactions  . Iodine-131 Hives  . Vancomycin Nausea And Vomiting    [facility] Tolerated vancomycin run in over 2 hours  . Zosyn [Piperacillin Sod-Tazobactam So] Swelling    Tolerated ceftriaxone  . Iohexol Rash   Scheduled Meds: . antiseptic oral rinse  7 mL Mouth Rinse QID  . ceFEPime (MAXIPIME) IV  1 g Intravenous Q12H  . chlorhexidine  15 mL Mouth Rinse BID  . enoxaparin (LOVENOX) injection  40 mg Subcutaneous Q24H  . famotidine  20 mg Per Tube BID  . feeding supplement (VITAL HIGH PROTEIN)  1,000 mL Per Tube Q24H  . fentaNYL  50 mcg Intravenous Once  . sodium chloride  10 mL Intravenous Q12H  . vancomycin  750 mg Intravenous Q36H   Continuous Infusions: . sodium chloride 10 mL/hr at 08/07/14 2106  . fentaNYL infusion INTRAVENOUS 175 mcg/hr (08/08/14 0816)  . norepinephrine (LEVOPHED) Adult infusion 26 mcg/min (08/08/14 1225)  . vasopressin (PITRESSIN) infusion - *FOR SHOCK* 0.03 Units/min (08/08/14 1328)   PRN Meds:.Place/Maintain arterial line **AND** sodium chloride, bisacodyl, fentaNYL, fentaNYL, midazolam, MUSCLE RUB, ondansetron **OR** ondansetron (ZOFRAN) IV, sodium chloride  BP 136/59 mmHg  Pulse 93  Temp(Src) 99.6 F (37.6 C) (Core (Comment))  Resp 23  Ht 4\' 10"  (1.473 m)  Wt 100.699 kg (222 lb)   BMI 46.41 kg/m2  SpO2 100%   PPS: 10% sedated on vent   Intake/Output Summary (Last 24 hours) at 08/08/14 1343 Last data filed at 08/08/14 1328  Gross per 24 hour  Intake 2551.13 ml  Output   1010 ml  Net 1541.13 ml       Physical Exam:  General: Sedated on vent, obese, NAD HEENT: [place]/AT, ETT, moist mucous membranes Chest: No labored breathing, scattered rhonchi, symmetric, mechanical ventilated CVS: RRR Abdomen: Soft, NT, ND, +BS Ext: Bilat BKA, warm to touch Neuro: Sedated, unable to follow commands   Labs: CBC    Component Value Date/Time   WBC 12.9* 08/08/2014 0500   RBC 2.92* 08/08/2014 0500   HGB 7.2* 08/08/2014 0500   HCT 25.0* 08/08/2014 0500   PLT 326 08/08/2014 0500   MCV 85.6 08/08/2014 0500   MCH 24.7* 08/08/2014 0500   MCHC 28.8* 08/08/2014 0500   RDW 19.1* 08/08/2014 0500   LYMPHSABS 1.1 08/06/2014 0330   MONOABS 1.3* 08/06/2014 0330   EOSABS 0.3 08/06/2014 0330   BASOSABS 0.0 08/06/2014 0330    BMET    Component Value Date/Time   NA 138 08/08/2014 0500   K 4.5 08/08/2014 0500   CL 105 08/08/2014 0500   CO2 24 08/08/2014 0500   GLUCOSE 198* 08/08/2014 0500   BUN 54* 08/08/2014 0500   CREATININE 0.95 08/08/2014 0500   CALCIUM 8.6 08/08/2014 0500   GFRNONAA 68* 08/08/2014 0500  GFRAA 78* 08/08/2014 0500    CMP     Component Value Date/Time   NA 138 08/08/2014 0500   K 4.5 08/08/2014 0500   CL 105 08/08/2014 0500   CO2 24 08/08/2014 0500   GLUCOSE 198* 08/08/2014 0500   BUN 54* 08/08/2014 0500   CREATININE 0.95 08/08/2014 0500   CALCIUM 8.6 08/08/2014 0500   PROT 5.2* 07/27/2014 0323   ALBUMIN 1.3* 07/27/2014 0323   AST 12 07/27/2014 0323   ALT 16 07/27/2014 0323   ALKPHOS 66 07/27/2014 0323   BILITOT 0.6 07/27/2014 0323   GFRNONAA 68* 08/08/2014 0500   GFRAA 78* 08/08/2014 0500    Assessment and Plan: 1. Code Status: NO CPR/shock. Continue ventilator and vasopressors/antiarrhythmics.  2. Symptom  Control: 1. Pain/dyspnea: Continue fentanyl infusion for comfort. I will recommend titrating fentanyl to comfort while on pressure support before extubation.  2. Anxiety/Agitation: Continue prn versed for comfort while intubated.  3. Decreased appetite: Continue temporary artificial feeding for now.  4. Weakness: Continue medical management.  5. Constipation: Dulcolax supp daily prn.  3. Psycho/Social: Emotional support provided to patient and family at bedside.  4. Disposition: To be determined. Likely hospital death with one way extubation tentative for Wednesday.    Time In Time Out Total Time Spent with Patient Total Overall Time  1225 1300     Greater than 50%  of this time was spent counseling and coordinating care related to the above assessment and plan.  Yong Channel, NP Palliative Medicine Team Pager # [phone] (M-F 8a-5p) Team Phone # [phone] (Nights/Weekends)
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
## 2014-08-09 DIAGNOSIS — R0602 Shortness of breath: Secondary | ICD-10-CM

## 2014-08-09 LAB — CBC
HEMATOCRIT: 22.9 % — AB (ref 36.0–46.0)
Hemoglobin: 6.6 g/dL — CL (ref 12.0–15.0)
MCH: 24.7 pg — AB (ref 26.0–34.0)
MCHC: 28.9 g/dL — ABNORMAL LOW (ref 30.0–36.0)
MCV: 85.6 fL (ref 78.0–100.0)
PLATELETS: 411 10*3/uL — AB (ref 150–400)
RBC: 2.63 MIL/uL — ABNORMAL LOW (ref 3.87–5.11)
RDW: 20.5 % — AB (ref 11.5–15.5)
WBC: 11.8 10*3/uL — AB (ref 4.0–10.5)

## 2014-08-09 LAB — GLUCOSE, CAPILLARY
GLUCOSE-CAPILLARY: 191 mg/dL — AB (ref 70–99)
Glucose-Capillary: 187 mg/dL — ABNORMAL HIGH (ref 70–99)
Glucose-Capillary: 193 mg/dL — ABNORMAL HIGH (ref 70–99)

## 2014-08-09 LAB — BASIC METABOLIC PANEL
Anion gap: 4 — ABNORMAL LOW (ref 5–15)
BUN: 52 mg/dL — ABNORMAL HIGH (ref 6–23)
CO2: 27 mmol/L (ref 19–32)
CREATININE: 0.93 mg/dL (ref 0.50–1.10)
Calcium: 8.6 mg/dL (ref 8.4–10.5)
Chloride: 106 mmol/L (ref 96–112)
GFR calc Af Amer: 80 mL/min — ABNORMAL LOW (ref >90)
GFR calc non Af Amer: 69 mL/min — ABNORMAL LOW (ref >90)
Glucose, Bld: 212 mg/dL — ABNORMAL HIGH (ref 70–99)
Potassium: 4.5 mmol/L (ref 3.5–5.1)
SODIUM: 137 mmol/L (ref 135–145)

## 2014-08-09 LAB — CULTURE, BLOOD (ROUTINE X 2)
CULTURE: NO GROWTH
Culture: NO GROWTH

## 2014-08-09 MED ORDER — MORPHINE SULFATE 2 MG/ML IJ SOLN
2.0000 mg | INTRAMUSCULAR | Status: DC | PRN
  Filled 2014-08-09 (×2): qty 1

## 2014-08-09 MED ORDER — ACETAMINOPHEN 650 MG RE SUPP: 650.0000 mg | Freq: Four times a day (QID) | RECTAL | Status: DC

## 2014-08-09 MED ORDER — SCOPOLAMINE 1 MG/3DAYS TD PT72
1.0000 | MEDICATED_PATCH | TRANSDERMAL | Status: DC | PRN
  Administered 2014-08-09: 1.5 mg via TRANSDERMAL
  Filled 2014-08-09 (×2): qty 1

## 2014-08-09 MED ORDER — ACETAMINOPHEN 160 MG/5ML PO SOLN
650.0000 mg | Freq: Once | ORAL | Status: AC
  Administered 2014-08-09: 650 mg via ORAL
  Filled 2014-08-09: qty 20.3

## 2014-08-09 MED ORDER — ATROPINE SULFATE 1 % OP SOLN
4.0000 [drp] | OPHTHALMIC | Status: DC | PRN
  Administered 2014-08-09: 4 [drp] via SUBLINGUAL
  Filled 2014-08-09: qty 2

## 2014-08-09 MED ORDER — FENTANYL BOLUS VIA INFUSION
50.0000 ug | INTRAVENOUS | Status: DC | PRN
  Filled 2014-08-09: qty 100

## 2014-08-09 MED ORDER — MORPHINE SULFATE 2 MG/ML IJ SOLN
2.0000 mg | Freq: Once | INTRAMUSCULAR | Status: AC
  Administered 2014-08-09: 2 mg via INTRAVENOUS

## 2014-08-09 MED ORDER — LORAZEPAM 2 MG/ML IJ SOLN
1.0000 mg | INTRAMUSCULAR | Status: DC | PRN
  Administered 2014-08-09: 2 mg via INTRAVENOUS
  Filled 2014-08-09: qty 1

## 2014-08-09 MED ORDER — MORPHINE SULFATE 2 MG/ML IJ SOLN: 2.0000 mg | INTRAMUSCULAR | Status: DC | PRN

## 2014-08-17 DIAGNOSIS — R0602 Shortness of breath: Secondary | ICD-10-CM | POA: Insufficient documentation

## 2014-08-22 NOTE — Progress Notes (Signed)
CRITICAL VALUE ALERT  Critical value received:  HGB 6.6  Date of notification:  2.17.16  Time of notification: 0530  Critical value read back: Yes  Nurse who received alert:  Eddie NorthJennifer Zafira Munos, RN  MD notified (1st page):  ELINK  Time of first page:  0530  MD notified (2nd page):  Time of second page:  Responding MD:  Pola CornELINK  Time MD responded:  [phone]

## 2014-08-22 NOTE — Progress Notes (Signed)
150 mL of Fentanyl gtt wasted in the sink. Burnard BuntingKatrice Keller, RN witnessed.

## 2014-08-22 NOTE — Progress Notes (Signed)
Progress Note from the Palliative Medicine Team at [place]: I have met with [name]'s family and we have discussed a plan for extubation and transition to comfort care. We will adjust sedation to control pain/dyspnea during pressure support and then proceed with extubation after family has had a chance to visit with her. We will d/c feeding tube with vent and then titrate vasopressors off over ~ an hour. I will adjust medication to focus on comfort. I stayed with family throughout this process and left them in care of nursing after extubation without complication and [name] appeared very comfortable throughout entire process.    Objective: Allergies  Allergen Reactions  . Iodine-131 Hives  . Vancomycin Nausea And Vomiting    Note from [facility]: Tolerated vancomycin run in over 2 hours  . Zosyn [Piperacillin Sod-Tazobactam So] Swelling    Tolerated ceftriaxone  . Iohexol Rash   Scheduled Meds: . antiseptic oral rinse  7 mL Mouth Rinse QID  . ceFEPime (MAXIPIME) IV  1 g Intravenous Q12H  . chlorhexidine  15 mL Mouth Rinse BID  . enoxaparin (LOVENOX) injection  40 mg Subcutaneous Q24H  . famotidine  20 mg Per Tube BID  . feeding supplement (VITAL HIGH PROTEIN)  1,000 mL Per Tube Q24H  . fentaNYL  50 mcg Intravenous Once  . sodium chloride  10 mL Intravenous Q12H  . vancomycin  750 mg Intravenous Q36H   Continuous Infusions: . sodium chloride 10 mL/hr at 08/07/14 2106  . fentaNYL infusion INTRAVENOUS 200 mcg/hr (08/24/2014 0624)  . norepinephrine (LEVOPHED) Adult infusion 24 mcg/min (08/08/14 2100)  . vasopressin (PITRESSIN) infusion - *FOR SHOCK* 0.03 Units/min (08/08/14 1328)   PRN Meds:.Place/Maintain arterial line **AND** sodium chloride, bisacodyl, fentaNYL, fentaNYL, midazolam, MUSCLE RUB, ondansetron **OR** ondansetron (ZOFRAN) IV, sodium chloride  BP 127/46 mmHg  Pulse 102  Temp(Src) 101.4 F (38.6 C) (Core (Comment))  Resp 25  Ht $R'4\' 10"'Vj$  (1.473 m)  Wt 104.781  kg (231 lb)  BMI 48.29 kg/m2  SpO2 100%   PPS: 10%   Intake/Output Summary (Last 24 hours) at Aug 24, 2014 1006 Last data filed at 2014-08-24 0800  Gross per 24 hour  Intake   2606 ml  Output   1110 ml  Net   1496 ml       Physical Exam:  General: Sedated on vent, obese, NAD HEENT: [place]/AT, ETT, moist mucous membranes Chest: No labored breathing, scattered rhonchi, symmetric, mechanical ventilated CVS: RRR Abdomen: Soft, NT, ND, +BS Ext: Bilat BKA, warm to touch Neuro: Sedated, unable to follow commands    Labs: CBC    Component Value Date/Time   WBC 11.8* 08-24-2014 0400   RBC 2.63* 08/24/2014 0400   HGB 6.6* 08-24-14 0400   HCT 22.9* 08-24-14 0400   PLT 411* 08-24-14 0400   MCV 85.6 24-Aug-2014 0400   MCH 24.7* 08/24/14 0400   MCHC 28.9* 2014-08-24 0400   RDW 20.5* 2014-08-24 0400   LYMPHSABS 1.1 08/06/2014 0330   MONOABS 1.3* 08/06/2014 0330   EOSABS 0.3 08/06/2014 0330   BASOSABS 0.0 08/06/2014 0330    BMET    Component Value Date/Time   NA 137 24-Aug-2014 0400   K 4.5 08-24-2014 0400   CL 106 08-24-14 0400   CO2 27 08-24-2014 0400   GLUCOSE 212* 08/24/2014 0400   BUN 52* 08/24/14 0400   CREATININE 0.93 08-24-14 0400   CALCIUM 8.6 August 24, 2014 0400   GFRNONAA 69* August 24, 2014 0400   GFRAA 80* Aug 24, 2014 0400  CMP     Component Value Date/Time   NA 137 31-Aug-2014 0400   K 4.5 31-Aug-2014 0400   CL 106 2014/08/31 0400   CO2 27 2014-08-31 0400   GLUCOSE 212* 08/31/14 0400   BUN 52* Aug 31, 2014 0400   CREATININE 0.93 Aug 31, 2014 0400   CALCIUM 8.6 2014-08-31 0400   PROT 5.2* 07/27/2014 0323   ALBUMIN 1.3* 07/27/2014 0323   AST 12 07/27/2014 0323   ALT 16 07/27/2014 0323   ALKPHOS 66 07/27/2014 0323   BILITOT 0.6 07/27/2014 0323   GFRNONAA 69* 31-Aug-2014 0400   GFRAA 80* Aug 31, 2014 0400    Assessment and Plan: 1. Code Status: DNR 2. Symptom Control: 1. Pain/dyspnea: Continue fentanyl infusion for comfort. Fentanyl titrated to  comfort while on pressure support before extubation and continued after extubation.  2. Anxiety/Agitation: Ativan prn.   3. Decreased appetite: NGT d/c when extubated. Comfort care.    4. Constipation: Dulcolax supp daily prn.  3. Psycho/Social: Emotional support provided to patient and family at bedside.  4. Disposition: Expecting hospital death after extubation.    Total time with patient/family: 80 min   Greater than 50%  of this time was spent counseling and coordinating care related to the above assessment and plan.  Vinie Sill, NP Palliative Medicine Team Pager # [phone] (M-F 8a-5p) Team Phone # [phone] (Nights/Weekends)

## 2014-08-22 NOTE — Progress Notes (Addendum)
1354 Patient asystole in all leads. Patient pupils fixed and dilated bilaterally. No audible heart tones auscultated. No lungs sounds auscultated. Patient family at bedside. Candace AloeIrekia Barnes, RN verified.
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
## 2014-08-22 NOTE — Progress Notes (Signed)
UR Completed.  [phone]  

## 2014-08-22 NOTE — Procedures (Signed)
Extubation Procedure Note  Patient Details:   Name: [name] DOB: 24-Sep-1960 MRN: [id number]   Airway Documentation:     Evaluation  O2 sats: currently acceptable Complications: No apparent complications Patient did tolerate procedure well. Bilateral Breath Sounds: Rhonchi Suctioning: Oral, Airway No   Pt terminally extubated to 2L [name].  Patient's family is aware of the patient's actions and how here breathing may vary throughout this process.  No complications noted at this time.    Devra DoppGibson, Donnica Jarnagin D 2015-01-25, 1:11 PM

## 2014-08-22 DEATH — deceased
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
## 2015-12-30 IMAGING — CR DG CHEST 1V PORT
1 series · 1 of 1 positions shown · non-contrast
Comparison: 07/28/2014

CLINICAL DATA: Sepsis, left IJ central venous catheter placement

EXAM:
PORTABLE CHEST - 1 VIEW

[portable]
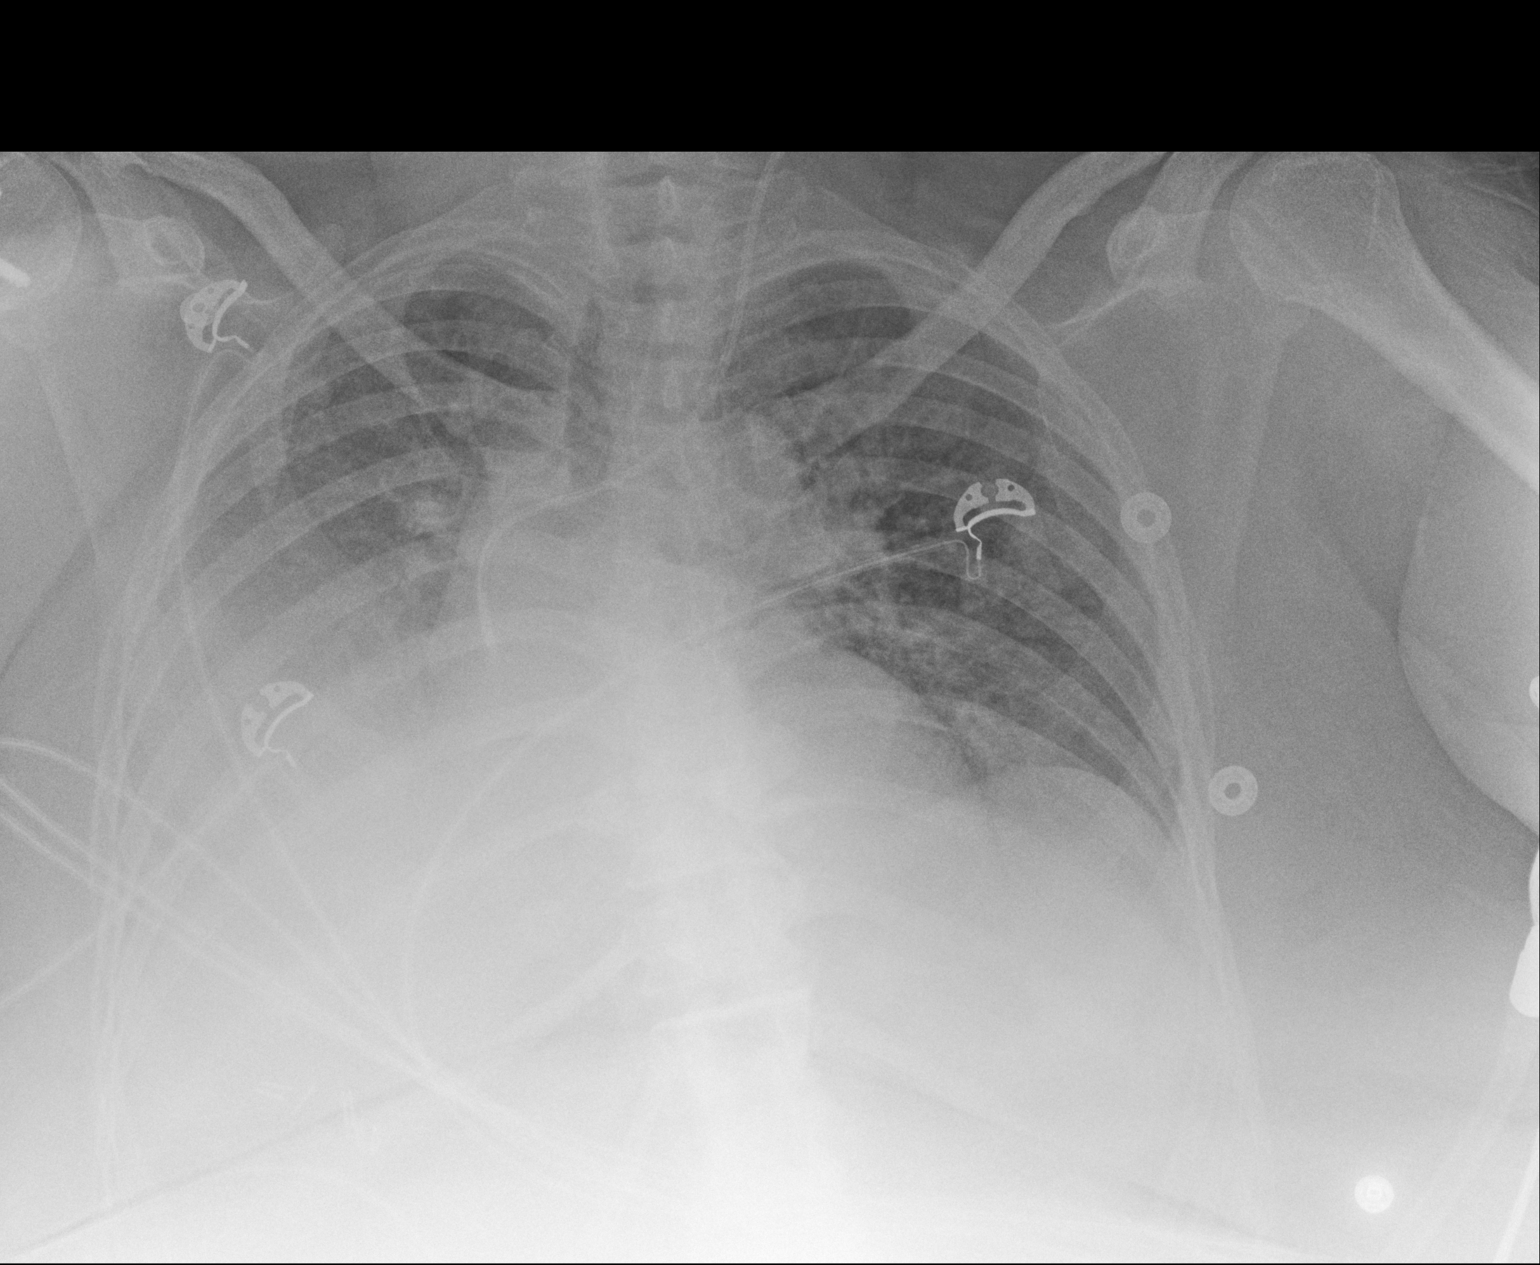

[1 of 1 positions shown; findings below may reference images not displayed]

FINDINGS: Cardiomegaly again noted. Mild interstitial edema again noted.
Persistent right pleural effusion with right lower lobe atelectasis
or infiltrate. Mild left basilar atelectasis. There is left IJ
central line with tip in SVC right atrium junction. No pneumothorax.
IMPRESSION: Persistent cardiomegaly and mild interstitial edema. Right pleural
effusion with right lower lobe atelectasis or infiltrate. Left IJ
central line with tip in SVC right atrium junction. No pneumothorax.

## 2016-01-02 IMAGING — CR DG CHEST 1V PORT
1 series · 1 of 1 positions shown · non-contrast
Comparison: Portable chest x-ray July 30, 2014

CLINICAL DATA: Acute respiratory failure, sepsis

EXAM:
PORTABLE CHEST - 1 VIEW

[AP]
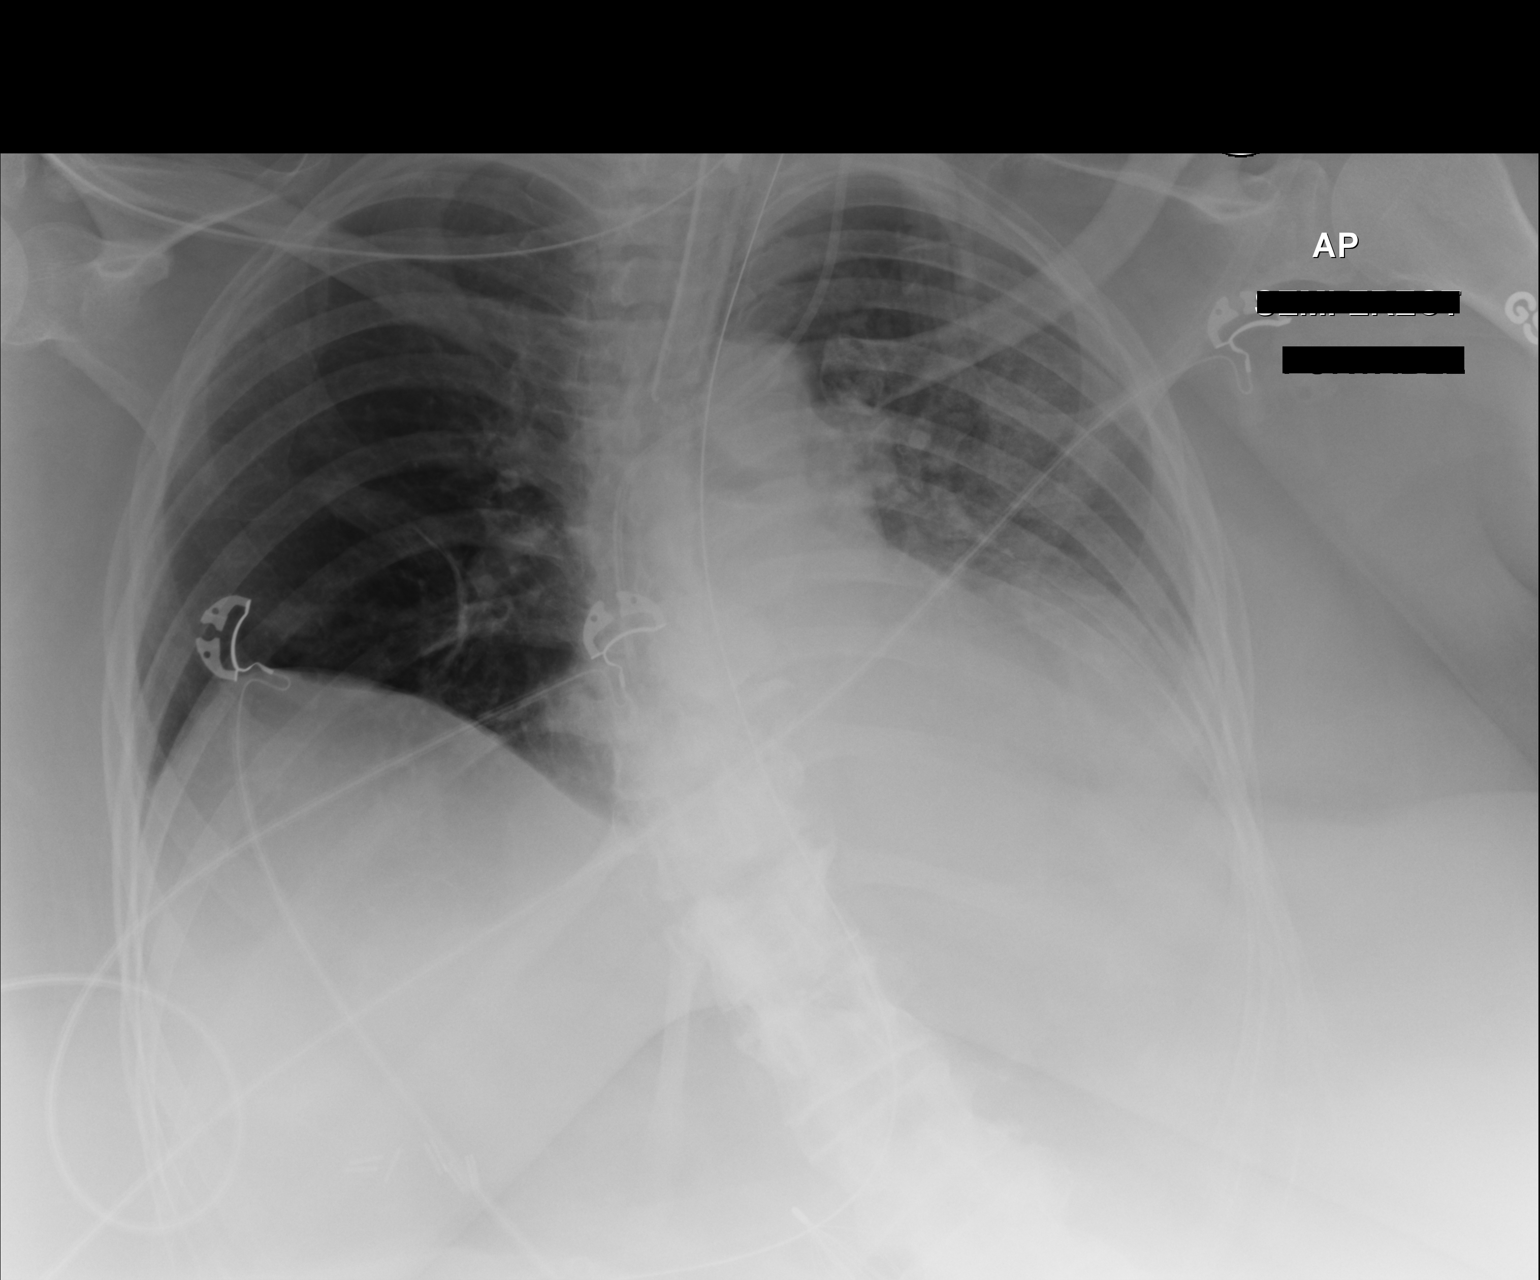

[1 of 1 positions shown; findings below may reference images not displayed]

FINDINGS: The lungs are slightly better inflated today. Increased retrocardiac
density persists. The left hemidiaphragm is obscured. The cardiac
silhouette is top-normal in size. The pulmonary vascularity is not
clearly engorged.

The endotracheal tube tip lies just under 2 cm above the crotch of
the carina. The esophagogastric tube tip projects below the inferior
margin of the image. The left internal jugular venous catheter tip
projects over the midportion of the SVC.
IMPRESSION: Improved aeration of both lungs. There is persistent left lower lobe
atelectasis or pneumonia. The endotracheal tube tip lies just under
2 cm above the crotch of the carina. Withdrawal by 1-2 cm is
recommended.

## 2016-01-03 IMAGING — CR DG CHEST 1V PORT
1 series · 1 of 1 positions shown · non-contrast
Comparison: Chest radiograph performed 07/31/2014

CLINICAL DATA: Endotracheal tube replacement; was removed by
patient. Initial encounter.

EXAM:
PORTABLE CHEST - 1 VIEW

[AP]
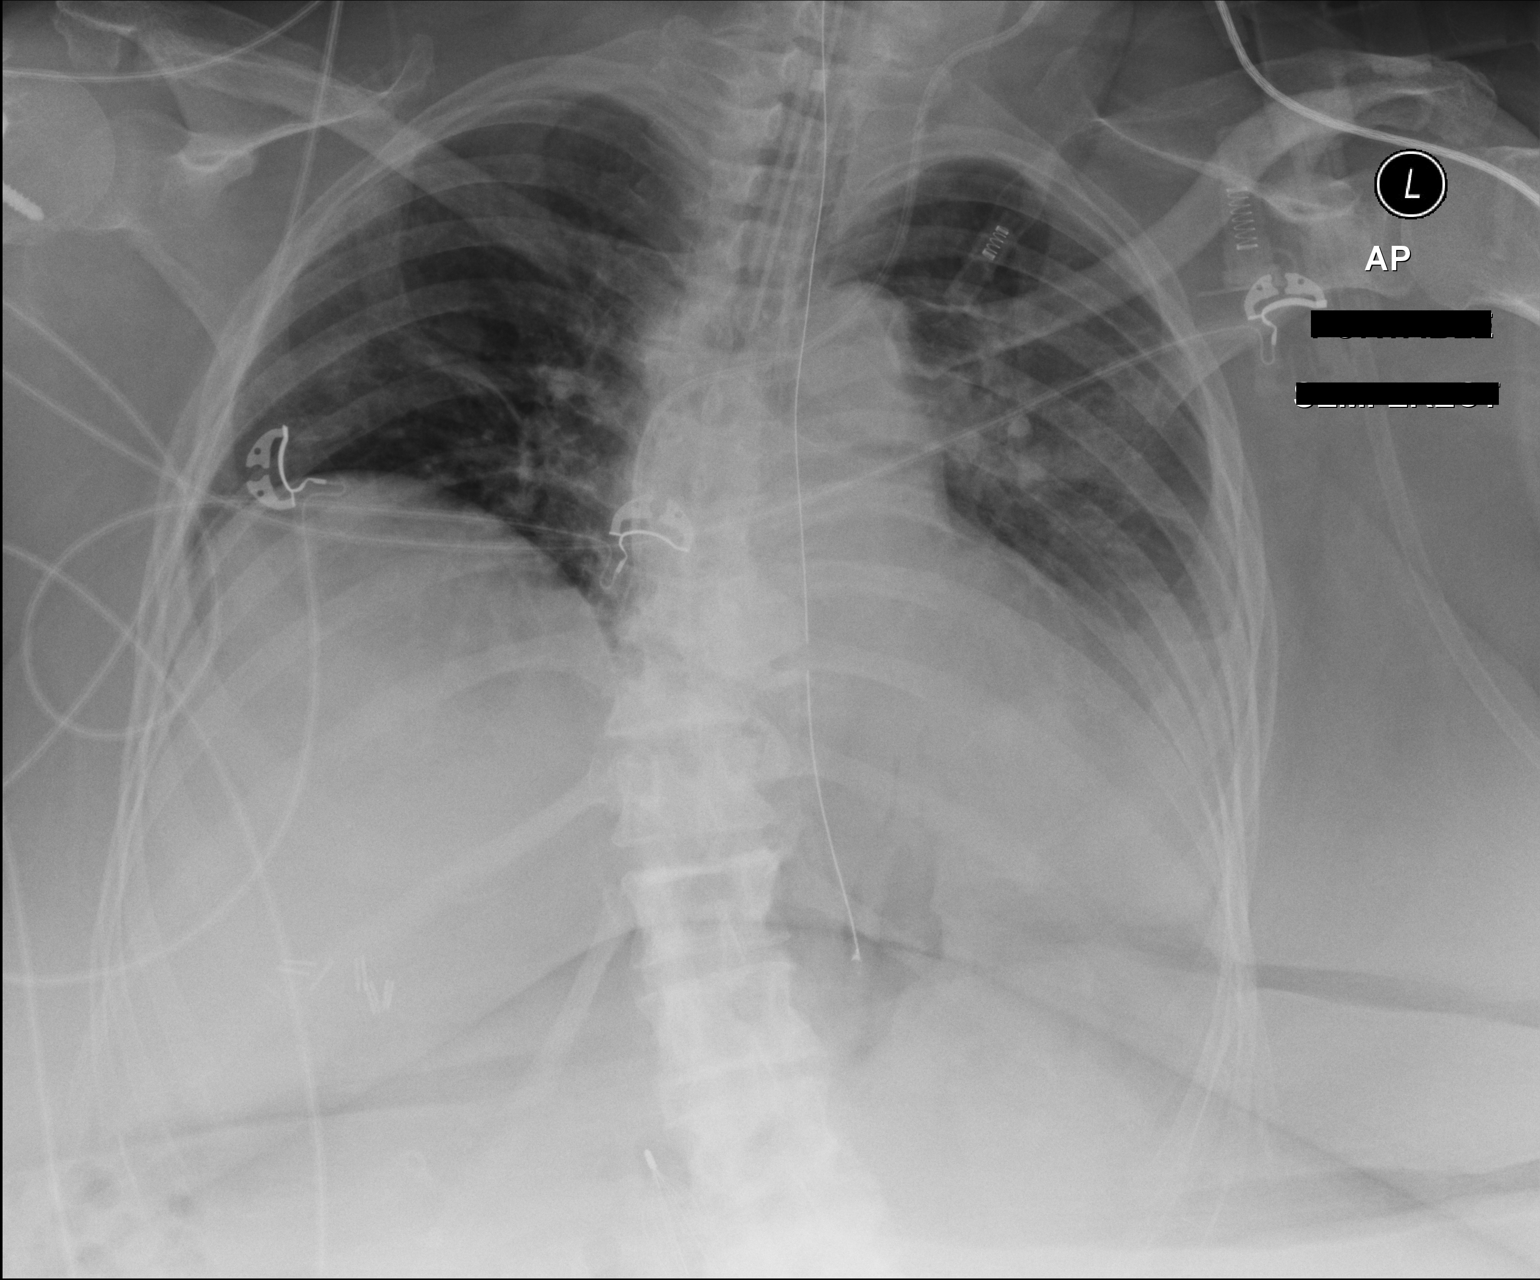

[1 of 1 positions shown; findings below may reference images not displayed]

FINDINGS: The patient's endotracheal tube is seen ending less than 1 cm above
the carina. This should be retracted 2-3 cm, as deemed clinically
appropriate.

A left IJ line is noted ending about the mid to distal SVC.

The enteric tube is seen ending overlying the fundus of the stomach,
difficult to fully assess due to poor characterization of the left
hemidiaphragm. The side port is noted at the distal esophagus. This
could be advanced approximately 9 cm, as deemed clinically
appropriate.

The lungs are significantly hypoexpanded. There is elevation of the
right hemidiaphragm. Left basilar and mid lung airspace opacity
raises concern for pneumonia, though mild asymmetric interstitial
edema might have a similar appearance. A small left pleural effusion
is seen. No pneumothorax is identified.

The cardiomediastinal silhouette is borderline normal in size. No
acute osseous abnormalities are identified.
IMPRESSION: 1. Endotracheal tube seen ending less than 1 cm above the carina.
This should be retracted 2-3 cm, as deemed clinically appropriate.
2. Enteric tube noted with the sideport overlying the distal
esophagus. This could be advanced approximately 9 cm, as deemed
clinically appropriate.
3. Lungs significantly hypoexpanded, with elevation the right
hemidiaphragm. Left basilar and mid lung airspace opacity raises
concern for pneumonia, though mild asymmetric interstitial edema
might have a similar appearance. This is similar in appearance to
the prior study. Small left pleural effusion noted.

These results were called by telephone at the time of interpretation
on 08/01/2014 at [DATE] to Jhon Edisson RN on [id number], who verbally
acknowledged these results.

## 2016-01-03 IMAGING — CR DG ABD PORTABLE 1V
1 series · 1 of 1 positions shown · non-contrast
Comparison: 0649 hours the same day and earlier.

CLINICAL DATA: 54-year-old female enteric tube placement. Initial
encounter.

EXAM:
PORTABLE ABDOMEN - 1 VIEW

[AP]
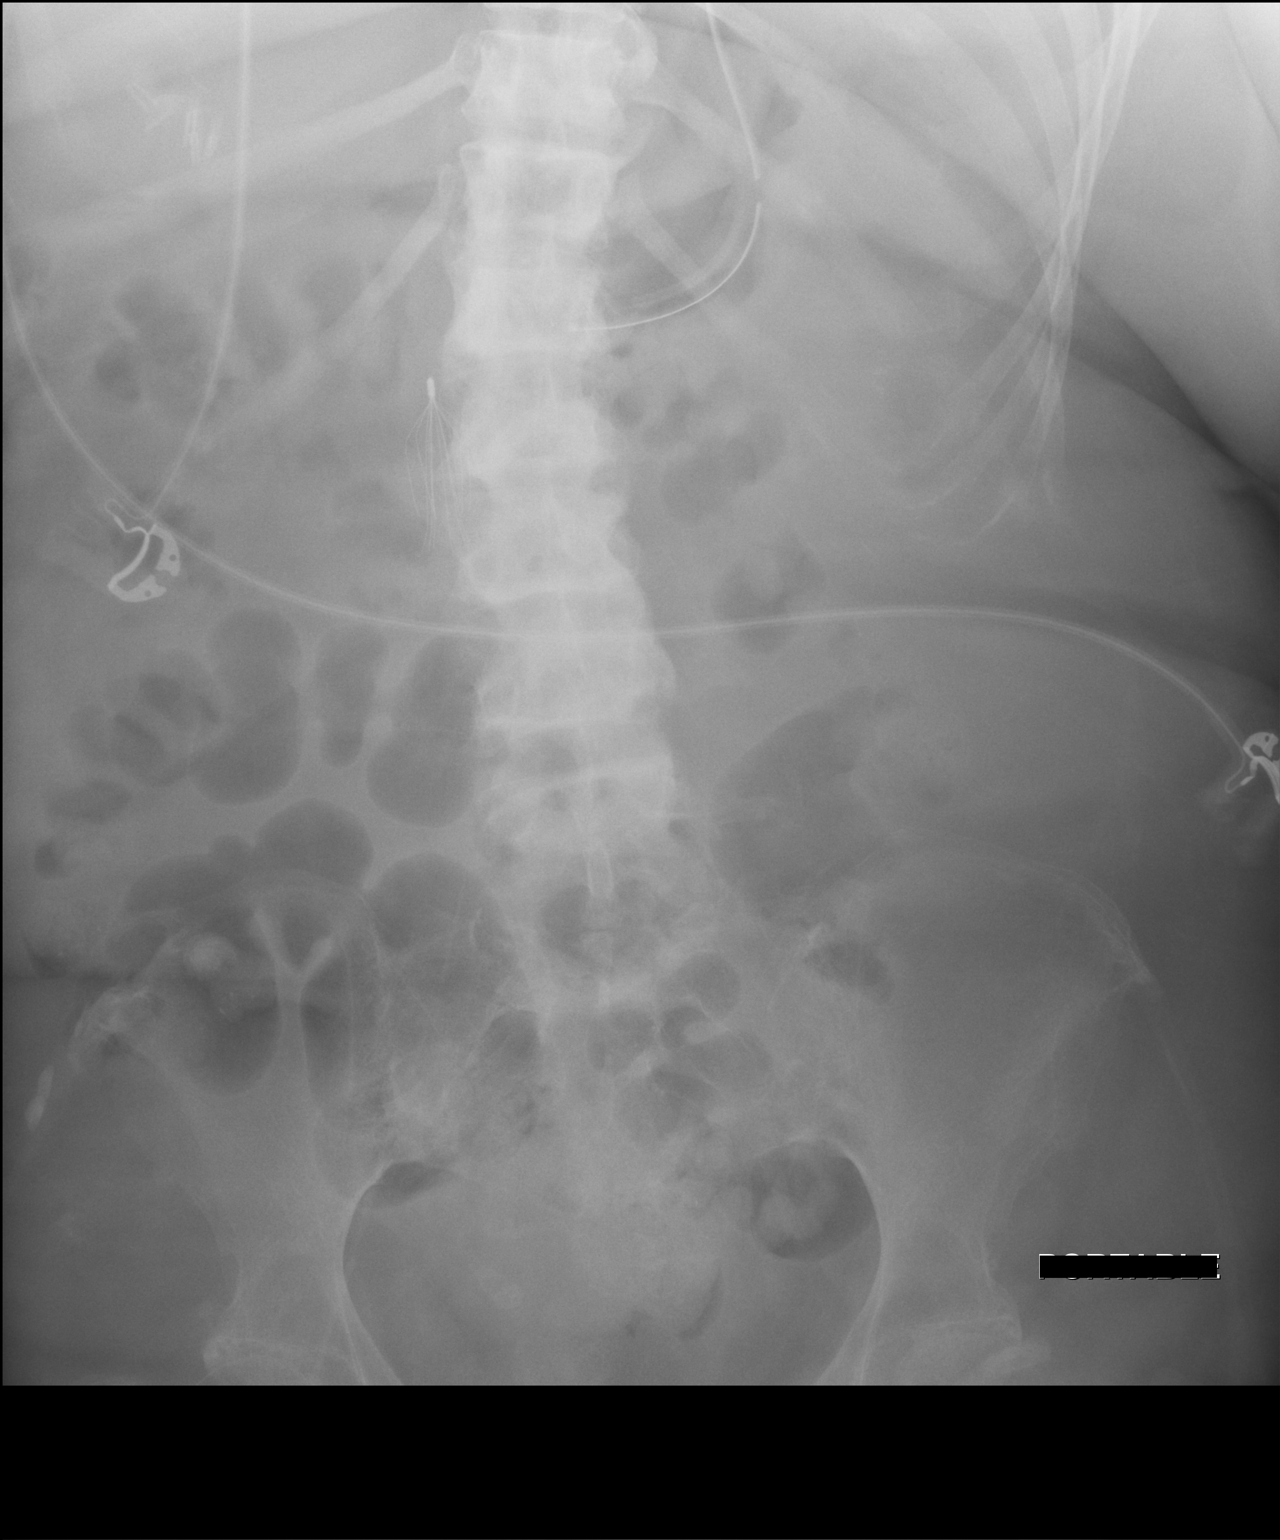

[1 of 1 positions shown; findings below may reference images not displayed]

FINDINGS: Portable AP view at 6262 hours. Enteric tube has been advanced, the
side hole now projects at the level of the gastric body. The tip is
at the midline.

Stable cholecystectomy clips. Stable IVC filter. Stable bowel gas
pattern and visible osseous structures.
IMPRESSION: Enteric tube advanced, side hole at the level of the gastric body.

## 2016-01-07 IMAGING — CR DG CHEST 1V PORT
1 series · 1 of 1 positions shown · non-contrast
Comparison: 08/04/2014

CLINICAL DATA: Endotracheal tube

EXAM:
PORTABLE CHEST - 1 VIEW

[AP]
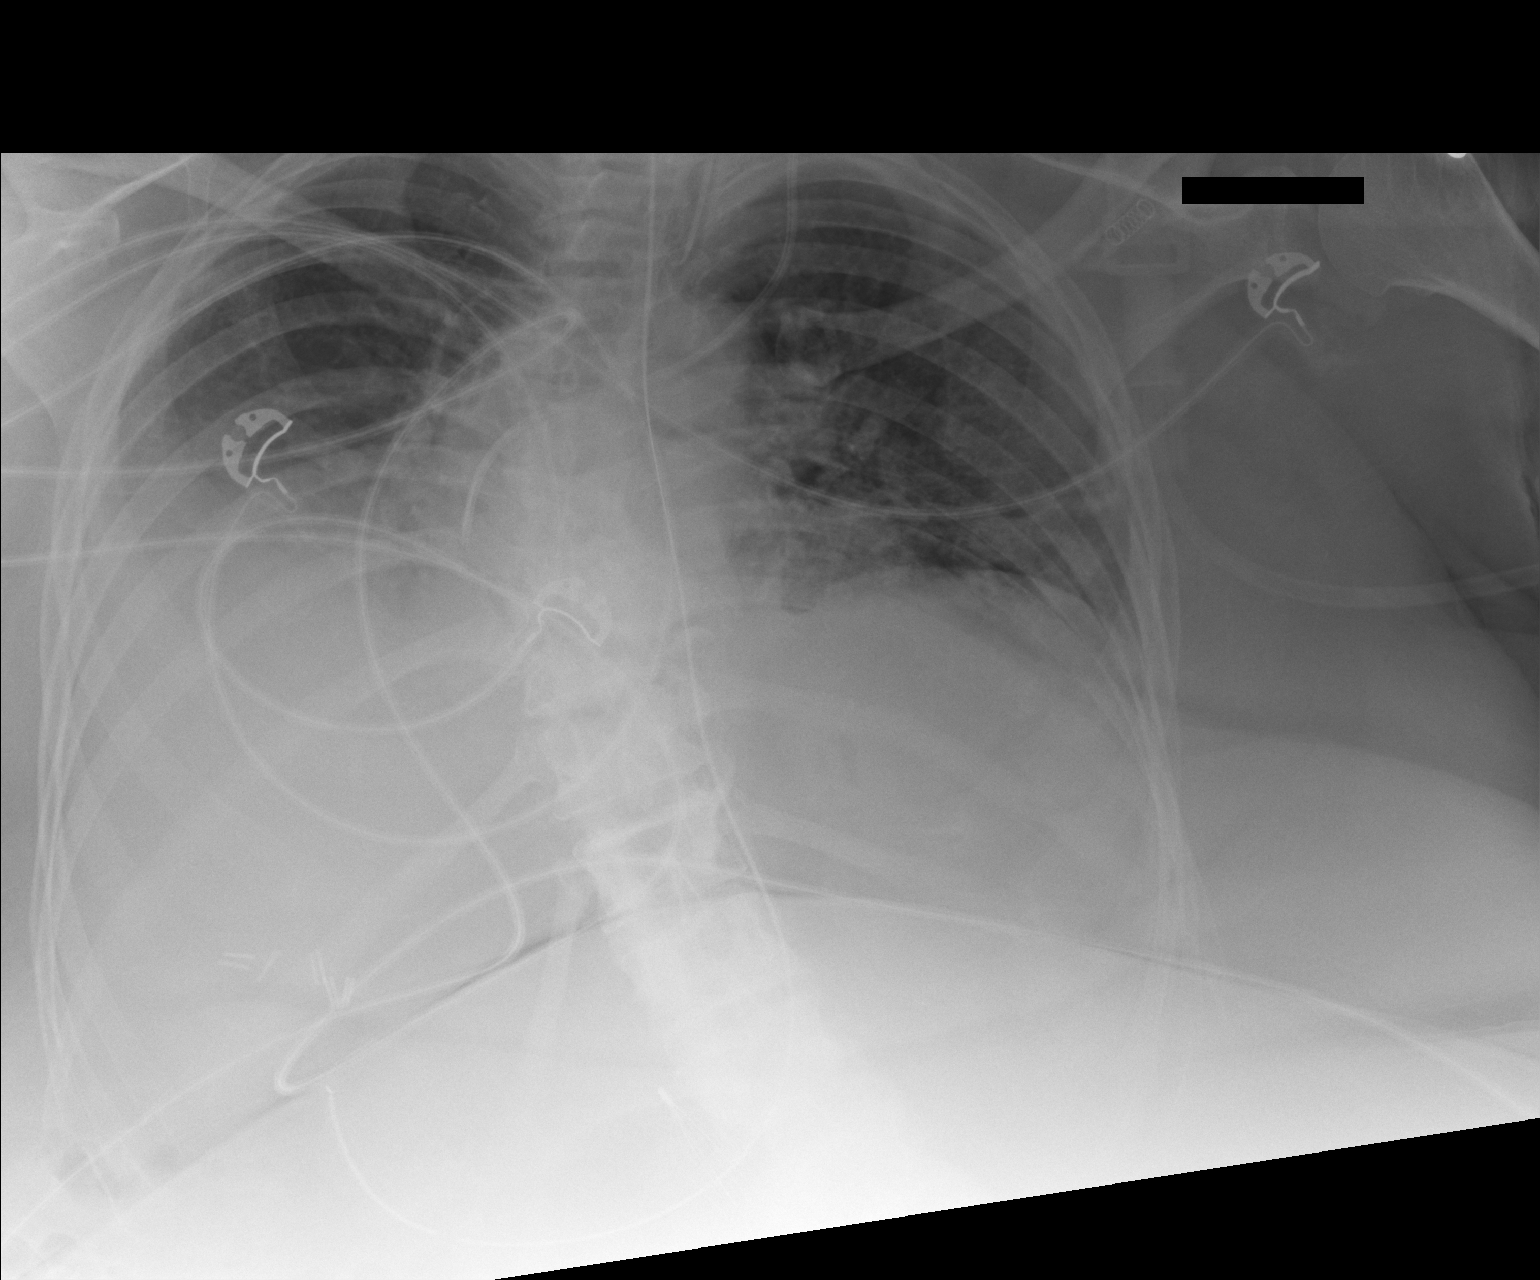

[1 of 1 positions shown; findings below may reference images not displayed]

FINDINGS: There is an endotracheal tube with tip near the thoracic inlet,
approximately 5 cm above the carina (underestimated due to
kyphosis). The orogastric tube tip is in the distal stomach. Stable
left IJ catheter, tip near the SVC.

Very low lung volumes with elevation of the right diaphragm. Hazy
right basilar opacity favors atelectasis and pleural fluid, but
there is noted history of sepsis. There is probable atelectasis at
the left base as well. No pneumothorax.
IMPRESSION: 1. Higher endotracheal tube, tip now at the thoracic inlet.
2. Stable hypoinflation with right-greater-than-left atelectasis or
pneumonia. Probable right pleural effusion.
